# Patient Record
Sex: Female | Born: 1985 | Race: Black or African American | Hispanic: No | Marital: Single | Smoking: Never smoker
Health system: Southern US, Community
[De-identification: ages and names within clinical notes are randomized; demographics above are authoritative.]

## PROBLEM LIST (undated history)

## (undated) ENCOUNTER — Inpatient Hospital Stay (HOSPITAL_COMMUNITY): Payer: Self-pay

## (undated) DIAGNOSIS — R51 Headache: Secondary | ICD-10-CM

## (undated) DIAGNOSIS — I10 Essential (primary) hypertension: Secondary | ICD-10-CM

## (undated) DIAGNOSIS — M419 Scoliosis, unspecified: Secondary | ICD-10-CM

## (undated) DIAGNOSIS — O26851 Spotting complicating pregnancy, first trimester: Secondary | ICD-10-CM

## (undated) DIAGNOSIS — D649 Anemia, unspecified: Secondary | ICD-10-CM

## (undated) DIAGNOSIS — Z789 Other specified health status: Secondary | ICD-10-CM

## (undated) DIAGNOSIS — IMO0002 Reserved for concepts with insufficient information to code with codable children: Secondary | ICD-10-CM

## (undated) DIAGNOSIS — R519 Headache, unspecified: Secondary | ICD-10-CM

## (undated) DIAGNOSIS — B009 Herpesviral infection, unspecified: Secondary | ICD-10-CM

## (undated) DIAGNOSIS — Z3490 Encounter for supervision of normal pregnancy, unspecified, unspecified trimester: Secondary | ICD-10-CM

## (undated) DIAGNOSIS — M549 Dorsalgia, unspecified: Secondary | ICD-10-CM

## (undated) DIAGNOSIS — J45909 Unspecified asthma, uncomplicated: Secondary | ICD-10-CM

## (undated) HISTORY — PX: ANAL FISSURE REPAIR: SHX2312

## (undated) HISTORY — DX: Encounter for supervision of normal pregnancy, unspecified, unspecified trimester: Z34.90

## (undated) HISTORY — DX: Headache, unspecified: R51.9

## (undated) HISTORY — DX: Spotting complicating pregnancy, first trimester: O26.851

## (undated) HISTORY — DX: Headache: R51

## (undated) HISTORY — DX: Reserved for concepts with insufficient information to code with codable children: IMO0002

## (undated) HISTORY — PX: NO PAST SURGERIES: SHX2092

---
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
## 2004-06-07 ENCOUNTER — Ambulatory Visit (HOSPITAL_COMMUNITY): Admission: RE | Admit: 2004-06-07 | Discharge: 2004-06-07 | Payer: Self-pay | Admitting: General Surgery

## 2004-08-15 ENCOUNTER — Ambulatory Visit (HOSPITAL_COMMUNITY): Admission: RE | Admit: 2004-08-15 | Discharge: 2004-08-15 | Payer: Self-pay | Admitting: Family Medicine

## 2007-12-11 ENCOUNTER — Emergency Department (HOSPITAL_COMMUNITY): Admission: EM | Admit: 2007-12-11 | Discharge: 2007-12-11 | Payer: Self-pay | Admitting: Emergency Medicine

## 2008-06-25 ENCOUNTER — Emergency Department (HOSPITAL_COMMUNITY): Admission: EM | Admit: 2008-06-25 | Discharge: 2008-06-25 | Payer: Self-pay | Admitting: Emergency Medicine

## 2008-11-06 ENCOUNTER — Ambulatory Visit (HOSPITAL_COMMUNITY): Admission: RE | Admit: 2008-11-06 | Discharge: 2008-11-06 | Payer: Self-pay | Admitting: Family Medicine

## 2008-11-22 ENCOUNTER — Emergency Department (HOSPITAL_COMMUNITY): Admission: EM | Admit: 2008-11-22 | Discharge: 2008-11-22 | Payer: Self-pay | Admitting: Emergency Medicine

## 2008-11-23 ENCOUNTER — Encounter: Payer: Self-pay | Admitting: Orthopedic Surgery

## 2008-11-23 ENCOUNTER — Emergency Department (HOSPITAL_COMMUNITY): Admission: EM | Admit: 2008-11-23 | Discharge: 2008-11-24 | Payer: Self-pay | Admitting: Emergency Medicine

## 2008-11-26 ENCOUNTER — Ambulatory Visit: Payer: Self-pay | Admitting: Orthopedic Surgery

## 2008-11-26 DIAGNOSIS — M549 Dorsalgia, unspecified: Secondary | ICD-10-CM | POA: Insufficient documentation

## 2008-11-26 DIAGNOSIS — M23302 Other meniscus derangements, unspecified lateral meniscus, unspecified knee: Secondary | ICD-10-CM | POA: Insufficient documentation

## 2008-11-26 DIAGNOSIS — IMO0002 Reserved for concepts with insufficient information to code with codable children: Secondary | ICD-10-CM | POA: Insufficient documentation

## 2008-11-26 DIAGNOSIS — M1710 Unilateral primary osteoarthritis, unspecified knee: Secondary | ICD-10-CM

## 2008-11-26 DIAGNOSIS — M2240 Chondromalacia patellae, unspecified knee: Secondary | ICD-10-CM

## 2008-11-27 ENCOUNTER — Encounter: Payer: Self-pay | Admitting: Orthopedic Surgery

## 2008-11-27 ENCOUNTER — Encounter (INDEPENDENT_AMBULATORY_CARE_PROVIDER_SITE_OTHER): Payer: Self-pay | Admitting: *Deleted

## 2008-11-30 ENCOUNTER — Ambulatory Visit (HOSPITAL_COMMUNITY): Admission: RE | Admit: 2008-11-30 | Discharge: 2008-11-30 | Payer: Self-pay | Admitting: Orthopedic Surgery

## 2008-12-01 ENCOUNTER — Encounter: Payer: Self-pay | Admitting: Orthopedic Surgery

## 2008-12-08 ENCOUNTER — Encounter: Payer: Self-pay | Admitting: Orthopedic Surgery

## 2008-12-08 ENCOUNTER — Encounter (HOSPITAL_COMMUNITY): Admission: RE | Admit: 2008-12-08 | Discharge: 2009-01-07 | Payer: Self-pay | Admitting: Orthopedic Surgery

## 2008-12-10 ENCOUNTER — Ambulatory Visit: Payer: Self-pay | Admitting: Orthopedic Surgery

## 2008-12-29 ENCOUNTER — Telehealth: Payer: Self-pay | Admitting: Orthopedic Surgery

## 2008-12-30 ENCOUNTER — Encounter: Payer: Self-pay | Admitting: Orthopedic Surgery

## 2008-12-31 ENCOUNTER — Telehealth: Payer: Self-pay | Admitting: Orthopedic Surgery

## 2009-01-05 ENCOUNTER — Ambulatory Visit: Payer: Self-pay | Admitting: Orthopedic Surgery

## 2009-01-05 DIAGNOSIS — M5126 Other intervertebral disc displacement, lumbar region: Secondary | ICD-10-CM | POA: Insufficient documentation

## 2009-01-06 ENCOUNTER — Telehealth: Payer: Self-pay | Admitting: Orthopedic Surgery

## 2009-01-08 ENCOUNTER — Ambulatory Visit (HOSPITAL_COMMUNITY): Admission: RE | Admit: 2009-01-08 | Discharge: 2009-01-08 | Payer: Self-pay | Admitting: Orthopedic Surgery

## 2009-01-11 ENCOUNTER — Encounter: Payer: Self-pay | Admitting: Orthopedic Surgery

## 2009-01-11 ENCOUNTER — Telehealth: Payer: Self-pay | Admitting: Orthopedic Surgery

## 2009-01-12 ENCOUNTER — Telehealth: Payer: Self-pay | Admitting: Orthopedic Surgery

## 2009-01-13 ENCOUNTER — Telehealth: Payer: Self-pay | Admitting: Orthopedic Surgery

## 2009-03-07 ENCOUNTER — Emergency Department (HOSPITAL_COMMUNITY): Admission: EM | Admit: 2009-03-07 | Discharge: 2009-03-07 | Payer: Self-pay | Admitting: Emergency Medicine

## 2009-03-22 ENCOUNTER — Encounter: Payer: Self-pay | Admitting: Orthopedic Surgery

## 2009-07-23 ENCOUNTER — Emergency Department (HOSPITAL_COMMUNITY): Admission: EM | Admit: 2009-07-23 | Discharge: 2009-07-23 | Payer: Self-pay | Admitting: Emergency Medicine

## 2009-09-21 ENCOUNTER — Encounter: Payer: Self-pay | Admitting: Orthopedic Surgery

## 2009-10-20 ENCOUNTER — Telehealth (INDEPENDENT_AMBULATORY_CARE_PROVIDER_SITE_OTHER): Payer: Self-pay | Admitting: *Deleted

## 2009-11-10 ENCOUNTER — Emergency Department (HOSPITAL_COMMUNITY): Admission: EM | Admit: 2009-11-10 | Discharge: 2009-11-10 | Payer: Self-pay | Admitting: Emergency Medicine

## 2010-03-06 NOTE — L&D Delivery Note (Signed)
Delivery Note At 7:01 PM a viable female was delivered via Vaginal, Spontaneous Delivery (Presentation: Left Occiput Anterior).  APGAR: 9, 9; weight 8 lb 5.2 oz (3776 g).   Placenta status: Intact, Spontaneous.  Cord: 3 vessels with the following complications: None.  Cord pH: not done   Anesthesia: Epidural  Episiotomy: None Lacerations: None Suture Repair: none Est. Blood Loss (mL):   Mom to postpartum.  Baby to nursery-stable.  CRESENZO-DISHMAN,Amadeus Oyama 12/14/2010, 7:23 PM

## 2010-04-05 NOTE — Progress Notes (Signed)
Summary: call from patient,request appt for back  Phone Note Call from Patient   Caller: Patient Summary of Call: Patient called to request appt for fol/up for her back, said hurting again in same spot.  I advised about having been ref'd to [facility], and that Dr Franky Macho had made recommendations for further ESI.  She states this really didn't work well for her.  Also, patient states she missed last appt which was for her knee.  No appt scheduled.  Please advise if any other recommendations.  Christina Randall new Ph [phone] Initial call taken by: Cammie Sickle,  October 20, 2009 4:41 PM  Follow-up for Phone Call        No need appt here, need to see Dr. Franky Macho her knee pain is probably coming from her back Follow-up by: Ether Griffins,  October 20, 2009 4:49 PM  Additional Follow-up for Phone Call Additional follow up Details #1::        cALLED patient back at [phone], ph#non-working Retried [facility] [phone] - Left voice mail msg. Additional Follow-up by: Cammie Sickle,  October 21, 2009 9:56 AM

## 2010-04-05 NOTE — Consult Note (Signed)
Summary: Consult [facility] Dr Franky Macho  Consult [facility] Dr Franky Macho   Imported By: Cammie Sickle 04/20/2009 16:12:16  _____________________________________________________________________  External Attachment:    Type:   Image     Comment:   External Document

## 2010-04-05 NOTE — Letter (Signed)
Summary: *Orthopedic No Show Letter  [facility]  [address]  [place], [place] [address]   Phone: [phone]  Fax: [phone]      09/20/2009    Christina Randall [address] [place], [place]  [address]    Dear Christina Randall,   Our records indicate that you missed your scheduled appointment with Dr. Beaulah Corin on 09/16/2009.  Please contact this office to reschedule your appointment as soon as possible.  It is important that you keep your scheduled appointments with your physician, so we can provide you the best care possible.        Sincerely,    Dr. Terrance Mass, MD [facility] Phone [phone]

## 2010-05-22 LAB — BASIC METABOLIC PANEL
BUN: 9 mg/dL (ref 6–23)
GFR calc Af Amer: >60 mL/min (ref >60)
GFR calc non Af Amer: >60 mL/min (ref >60)
Potassium: 3.7 mEq/L (ref 3.5–5.1)
Sodium: 137 mEq/L (ref 135–145)

## 2010-05-22 LAB — CBC
HCT: 37.1 % (ref 36.0–46.0)
Hemoglobin: 12.3 g/dL (ref 12.0–15.0)
Platelets: 317 10*3/uL (ref 150–400)
RBC: 4.55 MIL/uL (ref 3.87–5.11)
WBC: 6.1 10*3/uL (ref 4.0–10.5)

## 2010-05-22 LAB — DIFFERENTIAL
Eosinophils Relative: 1 % (ref 0–5)
Lymphocytes Relative: 25 % (ref 12–46)
Lymphs Abs: 1.5 10*3/uL (ref 0.7–4.0)
Monocytes Relative: 8 % (ref 3–12)

## 2010-05-22 LAB — URINALYSIS, ROUTINE W REFLEX MICROSCOPIC
Bilirubin Urine: NEGATIVE
Leukocytes, UA: NEGATIVE
Nitrite: NEGATIVE
Specific Gravity, Urine: 1.01 (ref 1.005–1.030)
pH: 7 (ref 5.0–8.0)

## 2010-05-22 LAB — PREGNANCY, URINE: Preg Test, Ur: NEGATIVE

## 2010-05-22 LAB — URINE MICROSCOPIC-ADD ON

## 2010-05-23 LAB — DIFFERENTIAL
Eosinophils Relative: 3 % (ref 0–5)
Lymphocytes Relative: 36 % (ref 12–46)
Lymphs Abs: 2.2 10*3/uL (ref 0.7–4.0)
Monocytes Absolute: 0.5 10*3/uL (ref 0.1–1.0)

## 2010-05-23 LAB — COMPREHENSIVE METABOLIC PANEL
AST: 18 U/L (ref 0–37)
Alkaline Phosphatase: 64 U/L (ref 39–117)
BUN: 10 mg/dL (ref 6–23)
CO2: 28 mEq/L (ref 19–32)
Chloride: 105 mEq/L (ref 96–112)
Creatinine, Ser: 0.83 mg/dL (ref 0.4–1.2)
GFR calc Af Amer: >60 mL/min (ref >60)
GFR calc non Af Amer: >60 mL/min (ref >60)
Total Bilirubin: 0.2 mg/dL — ABNORMAL LOW (ref 0.3–1.2)

## 2010-05-23 LAB — URINALYSIS, ROUTINE W REFLEX MICROSCOPIC
Glucose, UA: NEGATIVE mg/dL
Hgb urine dipstick: NEGATIVE
Ketones, ur: NEGATIVE mg/dL
Protein, ur: NEGATIVE mg/dL

## 2010-05-23 LAB — CBC
HCT: 33.8 % — ABNORMAL LOW (ref 36.0–46.0)
Hemoglobin: 11.8 g/dL — ABNORMAL LOW (ref 12.0–15.0)
WBC: 6.1 10*3/uL (ref 4.0–10.5)

## 2010-05-23 LAB — WET PREP, GENITAL

## 2010-05-24 LAB — ABO/RH: RH Type: POSITIVE

## 2010-05-24 LAB — RUBELLA ANTIBODY, IGM: Rubella: IMMUNE

## 2010-05-24 LAB — RPR: RPR: NONREACTIVE

## 2010-05-24 LAB — HEPATITIS B SURFACE ANTIGEN: Hepatitis B Surface Ag: NEGATIVE

## 2010-06-10 LAB — URINE MICROSCOPIC-ADD ON

## 2010-06-10 LAB — URINALYSIS, ROUTINE W REFLEX MICROSCOPIC
Bilirubin Urine: NEGATIVE
Ketones, ur: NEGATIVE mg/dL
Nitrite: NEGATIVE
pH: 6.5 (ref 5.0–8.0)

## 2010-06-10 LAB — PREGNANCY, URINE: Preg Test, Ur: NEGATIVE

## 2010-06-14 ENCOUNTER — Other Ambulatory Visit (HOSPITAL_COMMUNITY)
Admission: RE | Admit: 2010-06-14 | Discharge: 2010-06-14 | Disposition: A | Discharge status: Home / Self Care | Payer: Medicaid Other | Source: Ambulatory Visit | Attending: Obstetrics & Gynecology | Admitting: Obstetrics & Gynecology

## 2010-06-14 DIAGNOSIS — Z01419 Encounter for gynecological examination (general) (routine) without abnormal findings: Secondary | ICD-10-CM | POA: Insufficient documentation

## 2010-06-14 DIAGNOSIS — Z113 Encounter for screening for infections with a predominantly sexual mode of transmission: Secondary | ICD-10-CM | POA: Insufficient documentation

## 2010-07-09 ENCOUNTER — Emergency Department (HOSPITAL_COMMUNITY)
Admission: EM | Admit: 2010-07-09 | Discharge: 2010-07-09 | Discharge status: Against Medical Advice | Payer: Self-pay | Attending: Emergency Medicine | Admitting: Emergency Medicine

## 2010-07-22 NOTE — H&P (Signed)
Christina Randall, Christina Randall             ACCOUNT NO.:  [id number]   MEDICAL RECORD NO.:  [id number]          PATIENT TYPE:  AMB   LOCATION:  DAY                           FACILITY:  [facility]   PHYSICIAN:  Jerolyn Shin C. Katrinka Blazing, M.D.   DATE OF BIRTH:  1985/09/08   DATE OF ADMISSION:  DATE OF DISCHARGE:  LH                                HISTORY & PHYSICAL   HISTORY OF PRESENT ILLNESS:  A 25 year old female with a history of anal  pain for greater than 10 months. She had onset of anal pain after delivery  of her child. She states that she was constipated up to 3 weeks in the post-  partum period. She had a tear during delivery but it did not extend through  the rectum. The patient has pain with every bowel movement. She has been  treated with ProctoFoam, Lidocaine gel, and stool softeners without  improvement. She has a chronic anal fissure and is scheduled to have  fissurectomy with internal sphincterotomy.   PAST MEDICAL HISTORY:  There is a history of asthma and anemia.   MEDICATIONS:  Oral contraceptives.   ALLERGIES:  None.   FAMILY HISTORY:  Positive for diabetes.   SOCIAL HISTORY:  She is married, employed. She does not drink, smoke, or use  drugs.   PHYSICAL EXAMINATION:  VITAL SIGNS:  Blood pressure 116/80, pulse 68,  respiratory rate 20. Weight 240 pounds.  HEENT:  Unremarkable.  NECK:  Supple. No jugular venous distention or bruit.  CHEST:  Clear to auscultation.  HEART:  Regular rate and rhythm. Without murmur, rub, or gallop.  ABDOMEN:  Soft, nontender. No masses.  RECTAL:  Reveals a large posterior anal fissure with a tight anal sphincter  and extreme tenderness.  EXTREMITIES:  No clubbing, cyanosis, or edema.  NEUROLOGIC:  No focal, motor, sensory or cerebellar deficits.   IMPRESSION:  1.  Chronic anal fissure.  2.  Anal stenosis.   PLAN:  Examination under anesthesia. Anal fissurectomy with sphincterotomy.      LCS/MEDQ  D:  06/06/2004  T:  06/06/2004  Job:  [id number]    cc:   Lazaro Arms, M.D.  [address], [address]  [place]  [place] [address]  Fax: [phone]

## 2010-07-22 NOTE — Op Note (Signed)
NAMEJANELLY, Christina Randall             ACCOUNT NO.:  [id number]   MEDICAL RECORD NO.:  [id number]          PATIENT TYPE:  AMB   LOCATION:  DAY                           FACILITY:  [facility]   PHYSICIAN:  Jerolyn Shin C. Katrinka Blazing, M.D.   DATE OF BIRTH:  11-05-1985   DATE OF PROCEDURE:  06/07/2004  DATE OF DISCHARGE:                                 OPERATIVE REPORT   PREOPERATIVE DIAGNOSIS:  Anal fissure with anal stenosis.   POSTOPERATIVE DIAGNOSIS:  Anterior and posterior anal fissure with anal  stenosis.   PROCEDURE:  Anterior and posterior anal fissurectomy with internal anal  sphincterotomy.   SURGEON:  Elpidio Anis, M.D.   DESCRIPTION:  The procedure was done under spinal anesthesia with the  patient in a jackknife position.  She was prepped and draped in a sterile  field.  Digital examination of the anus was done.  An operating anoscope was  introduced, and this revealed a large, wide anterior fissure and posterior  fissure was located at 12 o'clock and 6 o'clock positions, respectively.  The anterior fissure was incised taking care to remove all of the  inflammatory scar tissue extending down to the internal sphincter.  The  posterior fissure was treated in a like manner. The circular internal fibers  were then dissected and two-thirds of the fibers were incised through the  bed of the posterior fissure.  Hemostasis was achieved.  The mucosal defect was then closed over the operative area using running  locking 2-0 chromic.  The anterior fissure was closed with running locking 2-  0 chromic.  Digital dilatation of the anal canal was carried out to three  fingerbreadths.  The patient tolerated procedure well.  Local infiltration  was carried out with 1% Xylocaine with epinephrine.  The patient was  transferred to a bed and taken to the postanesthetic care unit for further  monitoring.      LCS/MEDQ  D:  06/07/2004  T:  06/07/2004  Job:  [id number]   cc:   Lazaro Arms, M.D.  [address], [address]  [place]  [place] [address]  Fax: [phone]

## 2010-10-05 ENCOUNTER — Telehealth: Payer: Self-pay | Admitting: Orthopedic Surgery

## 2010-10-05 NOTE — Telephone Encounter (Signed)
Patient called 10/04/10 and 10/05/10 (today) to request appointment for right knee pain.  Patient had been seen for this problem in 2010 and had been referred out to [facility].  She was seen by Dr. Franky Macho in 2011.  Also had been referred to have epidural steroid injections.    She states that she is now pregnant and having knee problem again.  She states her OB/Gyn is Dr. Emelda Fear, and is aware that she cannot have Xrays at this time. She said Dr. Emelda Fear "wants to know about her knee problem."  No referral or request for medical records has been received from this office.  Patient's insurance is [employer] which requires a referral from primary care physician.   I have advised patient of all of the above and that we therefore cannot schedule appointment directly. Please advise if any further recommendations.

## 2010-10-06 NOTE — Telephone Encounter (Signed)
Needs appt for her knee, ok, she has a meniscal tear, next available after she has referral, not emergent this was why she was seen before

## 2010-10-07 NOTE — Telephone Encounter (Signed)
I called back to patient. Left voice mail message for her to return call about appointment. [phone] Home ph#)

## 2010-10-14 NOTE — Telephone Encounter (Signed)
10/14/10 called back to patient, left same voice mail message.

## 2010-10-24 DIAGNOSIS — N930 Postcoital and contact bleeding: Secondary | ICD-10-CM

## 2010-11-27 ENCOUNTER — Encounter (HOSPITAL_COMMUNITY): Payer: Self-pay

## 2010-11-27 ENCOUNTER — Inpatient Hospital Stay (HOSPITAL_COMMUNITY)
Admission: AD | Admit: 2010-11-27 | Discharge: 2010-11-27 | Disposition: A | Discharge status: Home / Self Care | Payer: Medicaid Other | Source: Ambulatory Visit | Attending: Obstetrics & Gynecology | Admitting: Obstetrics & Gynecology

## 2010-11-27 DIAGNOSIS — O4690 Antepartum hemorrhage, unspecified, unspecified trimester: Secondary | ICD-10-CM | POA: Insufficient documentation

## 2010-11-27 DIAGNOSIS — N930 Postcoital and contact bleeding: Secondary | ICD-10-CM

## 2010-11-27 HISTORY — DX: Herpesviral infection, unspecified: B00.9

## 2010-11-27 LAB — URINALYSIS, ROUTINE W REFLEX MICROSCOPIC
Glucose, UA: NEGATIVE mg/dL
Ketones, ur: NEGATIVE mg/dL
Leukocytes, UA: NEGATIVE
Nitrite: NEGATIVE
Specific Gravity, Urine: 1.015 (ref 1.005–1.030)
pH: 6.5 (ref 5.0–8.0)

## 2010-11-27 LAB — URINE MICROSCOPIC-ADD ON

## 2010-11-27 NOTE — Progress Notes (Signed)
D LAWSON CNM 37.5WKS G2P1 C/O VAGINAL BLEEDING THIS AM. SVE FT/THICK/HIGH. REACTIVE STRIP, OCC UC'S. NO ORDERS AT THIS TIME WILL SEE IN MAU.

## 2010-11-27 NOTE — ED Provider Notes (Signed)
History   Sex day before yesterday got up this am with dark bleeding sm amt. No pain, no ROM.  Chief Complaint  Patient presents with  . Vaginal Bleeding  . Abdominal Cramping   HPI  OB History    Grav Para Term Preterm Abortions TAB SAB Ect Mult Living   2 1 1       1       Past Medical History  Diagnosis Date  . Herpes     Past Surgical History  Procedure Date  . No past surgeries     No family history on file.  History  Substance Use Topics  . Smoking status: Never Smoker   . Smokeless tobacco: Not on file  . Alcohol Use: No    Allergies: No Known Allergies  Prescriptions prior to admission  Medication Sig Dispense Refill  . acetaminophen (TYLENOL) 325 MG tablet Take 325 mg by mouth every 6 (six) hours as needed. For back pain or headache        . Calcium Carbonate Antacid (TUMS PO) Take 1 tablet by mouth 3 (three) times daily as needed. For heartburn       . ValACYclovir HCl (VALTREX PO) Take 1 tablet by mouth 2 (two) times daily.          Review of Systems  Constitutional: Negative.   HENT: Negative.   Eyes: Negative.   Respiratory: Negative.   Cardiovascular: Negative.   Gastrointestinal: Negative.   Genitourinary: Negative.   Musculoskeletal: Negative.   Skin: Negative.   Neurological: Negative.   Endo/Heme/Allergies: Negative.   Psychiatric/Behavioral: Negative.    Physical Exam   Blood pressure 143/84, pulse 93, temperature 98.7 F (37.1 C), resp. rate 16, height 5\' 9"  (1.753 m), weight 132.269 kg (291 lb 9.6 oz).  Physical Exam  Constitutional: She is oriented to person, place, and time. She appears well-developed and well-nourished.  HENT:  Head: Normocephalic.  Eyes: Pupils are equal, round, and reactive to light.  Neck: Normal range of motion.  Cardiovascular: Normal rate and regular rhythm.   Respiratory: Effort normal and breath sounds normal.  GI: Soft. Bowel sounds are normal.  Genitourinary: Vagina normal and uterus normal.      SVE ft/th/post/high. Scant amt dark brownish spotting noted on glove with exam.  Musculoskeletal: Normal range of motion.  Neurological: She is alert and oriented to person, place, and time. She has normal reflexes.  Skin: Skin is warm and dry.  Psychiatric: She has a normal mood and affect. Her behavior is normal. Judgment and thought content normal.    MAU Course  Procedures  MDM POC discussed with Dr. Despina Hidden. Pt to be d'cd home.  Assessment and Plan  Stable maternal-fetal unit. Postcoital bleeding.  Zerita Boers 11/27/2010, 6:03 PM

## 2010-11-27 NOTE — Progress Notes (Signed)
This morning had spotting then got out of church had bleeding like a period with mild cramping. Last intercourse 2 days ago.

## 2010-12-05 LAB — CBC
HCT: 36
Hemoglobin: 11.9 — ABNORMAL LOW
MCHC: 33
RBC: 4.56

## 2010-12-05 LAB — DIFFERENTIAL
Eosinophils Relative: 1
Lymphocytes Relative: 8 — ABNORMAL LOW
Monocytes Absolute: 0.5
Monocytes Relative: 5
Neutro Abs: 9 — ABNORMAL HIGH

## 2010-12-05 LAB — BASIC METABOLIC PANEL
CO2: 27
Calcium: 8.7
GFR calc Af Amer: >60
GFR calc non Af Amer: >60
Glucose, Bld: 99
Potassium: 3.6
Sodium: 137

## 2010-12-05 LAB — STREP A DNA PROBE: Group A Strep Probe: NEGATIVE

## 2010-12-13 ENCOUNTER — Inpatient Hospital Stay (HOSPITAL_COMMUNITY)
Admission: AD | Admit: 2010-12-13 | Discharge: 2010-12-13 | Disposition: A | Discharge status: Home / Self Care | Payer: Medicaid Other | Source: Ambulatory Visit | Attending: Family Medicine | Admitting: Family Medicine

## 2010-12-13 ENCOUNTER — Encounter (HOSPITAL_COMMUNITY): Payer: Self-pay | Admitting: *Deleted

## 2010-12-13 DIAGNOSIS — O479 False labor, unspecified: Secondary | ICD-10-CM | POA: Insufficient documentation

## 2010-12-13 MED ORDER — METHOTREXATE INJECTION FOR WOMEN'S HOSPITAL: 50.0000 mg/m2 | Freq: Once | INTRAMUSCULAR | Status: DC

## 2010-12-13 NOTE — ED Provider Notes (Signed)
History     Chief Complaint  Patient presents with  . Labor Eval   HPI G2 P1001 at 40.0 weeks presents for labor eval. States has contractions that make her take a deep breath, but not too painful. Denies leaking or bleeding. + fetal movement.    Past Medical History  Diagnosis Date  . Herpes     Past Surgical History  Procedure Date  . No past surgeries     No family history on file.  History  Substance Use Topics  . Smoking status: Never Smoker   . Smokeless tobacco: Not on file  . Alcohol Use: No    Allergies: No Known Allergies  Prescriptions prior to admission  Medication Sig Dispense Refill  . ValACYclovir HCl (VALTREX PO) Take 1 tablet by mouth 2 (two) times daily.        Marland Kitchen acetaminophen (TYLENOL) 325 MG tablet Take 325 mg by mouth every 6 (six) hours as needed. For back pain or headache        . Calcium Carbonate Antacid (TUMS PO) Take 1 tablet by mouth 3 (three) times daily as needed. For heartburn         ROS  As above.  Physical Exam   Blood pressure 132/80, pulse 87, temperature 98.8 F (37.1 C), temperature source Oral, resp. rate 20, height 5\' 9"  (1.753 m), weight 293 lb 3.2 oz (132.995 kg).  Physical Exam  Constitutional: She is oriented to person, place, and time. She appears well-developed and well-nourished.  HENT:  Head: Normocephalic.  Respiratory: Effort normal.  GI: Soft. There is no tenderness. There is no rebound and no guarding.  Genitourinary: Vagina normal and uterus normal. No vaginal discharge found.       FHR reactive with some early decels when lying supine. Irregular contractions, patient does not appear to be breathing through them when they occur. Has appt Thursday. Cervix 2cm/70%/-3/vtx and this is unchanged after 1.5 hours. FHR viewed by Dr Shawnie Pons also  Musculoskeletal: Normal range of motion.  Neurological: She is alert and oriented to person, place, and time.  Skin: Skin is warm and dry.  Psychiatric: She has a normal  mood and affect.    MAU Course  Procedures   Assessment and Plan  A:  Prodromal vs Latent Contractions, no cervical change P:  Discharge home (OK w/Dr Shawnie Pons) Keep appt Thursday Labor precautions.  Christina Randall 12/13/2010, 3:26 PM

## 2010-12-13 NOTE — Progress Notes (Signed)
Ctx's started at 0400, getting closer and stronger. No bleeding or leaking, lost mucous.

## 2010-12-13 NOTE — ED Provider Notes (Signed)
Chart reviewed and agree with management and plan.  

## 2010-12-14 ENCOUNTER — Encounter (HOSPITAL_COMMUNITY): Payer: Self-pay | Admitting: *Deleted

## 2010-12-14 ENCOUNTER — Encounter (HOSPITAL_COMMUNITY): Payer: Self-pay | Admitting: Anesthesiology

## 2010-12-14 ENCOUNTER — Inpatient Hospital Stay (HOSPITAL_COMMUNITY)
Admission: AD | Admit: 2010-12-14 | Discharge: 2010-12-16 | DRG: 775 | Disposition: A | Discharge status: Home / Self Care | Payer: Medicaid Other | Source: Ambulatory Visit | Attending: Obstetrics & Gynecology | Admitting: Obstetrics & Gynecology

## 2010-12-14 ENCOUNTER — Inpatient Hospital Stay (HOSPITAL_COMMUNITY): Payer: Medicaid Other | Admitting: Anesthesiology

## 2010-12-14 DIAGNOSIS — O4290 Premature rupture of membranes, unspecified as to length of time between rupture and onset of labor, unspecified weeks of gestation: Principal | ICD-10-CM | POA: Diagnosis present

## 2010-12-14 DIAGNOSIS — IMO0001 Reserved for inherently not codable concepts without codable children: Secondary | ICD-10-CM

## 2010-12-14 HISTORY — DX: Other specified health status: Z78.9

## 2010-12-14 LAB — CBC
HCT: 32.3 % — ABNORMAL LOW (ref 36.0–46.0)
MCHC: 32.8 g/dL (ref 30.0–36.0)
RDW: 14.9 % (ref 11.5–15.5)
WBC: 8.9 10*3/uL (ref 4.0–10.5)

## 2010-12-14 LAB — RPR: RPR Ser Ql: NONREACTIVE

## 2010-12-14 MED ORDER — PENICILLIN G POTASSIUM 5000000 UNITS IJ SOLR
5.0000 10*6.[IU] | Freq: Once | INTRAVENOUS | Status: AC
  Administered 2010-12-14: 5 10*6.[IU] via INTRAVENOUS
  Filled 2010-12-14: qty 5

## 2010-12-14 MED ORDER — PHENYLEPHRINE 40 MCG/ML (10ML) SYRINGE FOR IV PUSH (FOR BLOOD PRESSURE SUPPORT)
80.0000 ug | PREFILLED_SYRINGE | INTRAVENOUS | Status: DC | PRN
  Filled 2010-12-14: qty 5

## 2010-12-14 MED ORDER — LACTATED RINGERS IV SOLN: 500.0000 mL | Freq: Once | INTRAVENOUS | Status: DC

## 2010-12-14 MED ORDER — TERBUTALINE SULFATE 1 MG/ML IJ SOLN: 0.2500 mg | Freq: Once | INTRAMUSCULAR | Status: DC | PRN

## 2010-12-14 MED ORDER — OXYTOCIN BOLUS FROM INFUSION
500.0000 mL | Freq: Once | INTRAVENOUS | Status: DC
  Filled 2010-12-14: qty 500

## 2010-12-14 MED ORDER — ONDANSETRON HCL 4 MG/2ML IJ SOLN: 4.0000 mg | Freq: Four times a day (QID) | INTRAMUSCULAR | Status: DC | PRN

## 2010-12-14 MED ORDER — PRENATAL PLUS 27-1 MG PO TABS
1.0000 | ORAL_TABLET | Freq: Every day | ORAL | Status: DC
  Administered 2010-12-15 – 2010-12-16 (×2): 1 via ORAL
  Filled 2010-12-14 (×2): qty 1

## 2010-12-14 MED ORDER — ONDANSETRON HCL 4 MG PO TABS
4.0000 mg | ORAL_TABLET | Freq: Once | ORAL | Status: AC
  Administered 2010-12-14: 4 mg via ORAL
  Filled 2010-12-14: qty 1

## 2010-12-14 MED ORDER — OXYTOCIN 20 UNITS IN LACTATED RINGERS INFUSION - SIMPLE: 125.0000 mL/h | Freq: Once | INTRAVENOUS | Status: DC

## 2010-12-14 MED ORDER — BENZOCAINE-MENTHOL 20-0.5 % EX AERO
1.0000 "application " | INHALATION_SPRAY | CUTANEOUS | Status: DC | PRN
  Administered 2010-12-14: 1 via TOPICAL

## 2010-12-14 MED ORDER — SIMETHICONE 80 MG PO CHEW: 80.0000 mg | CHEWABLE_TABLET | ORAL | Status: DC | PRN

## 2010-12-14 MED ORDER — FLEET ENEMA 7-19 GM/118ML RE ENEM: 1.0000 | ENEMA | RECTAL | Status: DC | PRN

## 2010-12-14 MED ORDER — FENTANYL 2.5 MCG/ML BUPIVACAINE 1/10 % EPIDURAL INFUSION (WH - ANES)
INTRAMUSCULAR | Status: DC | PRN
  Administered 2010-12-14: 14 mL/h via EPIDURAL

## 2010-12-14 MED ORDER — ACETAMINOPHEN 325 MG PO TABS: 650.0000 mg | ORAL_TABLET | ORAL | Status: DC | PRN

## 2010-12-14 MED ORDER — WITCH HAZEL-GLYCERIN EX PADS
1.0000 | MEDICATED_PAD | CUTANEOUS | Status: DC | PRN
Start: 2010-12-14 — End: 2010-12-16

## 2010-12-14 MED ORDER — MEASLES, MUMPS & RUBELLA VAC ~~LOC~~ INJ: 0.5000 mL | INJECTION | Freq: Once | SUBCUTANEOUS | Status: DC

## 2010-12-14 MED ORDER — OXYCODONE-ACETAMINOPHEN 5-325 MG PO TABS
1.0000 | ORAL_TABLET | ORAL | Status: DC | PRN
  Administered 2010-12-15 – 2010-12-16 (×5): 1 via ORAL
  Administered 2010-12-16: 2 via ORAL
  Filled 2010-12-14 (×5): qty 1
  Filled 2010-12-14: qty 2

## 2010-12-14 MED ORDER — FENTANYL 2.5 MCG/ML BUPIVACAINE 1/10 % EPIDURAL INFUSION (WH - ANES)
14.0000 mL/h | INTRAMUSCULAR | Status: DC
  Administered 2010-12-14 (×3): 14 mL/h via EPIDURAL
  Filled 2010-12-14 (×4): qty 60

## 2010-12-14 MED ORDER — ONDANSETRON HCL 4 MG/2ML IJ SOLN: 4.0000 mg | INTRAMUSCULAR | Status: DC | PRN

## 2010-12-14 MED ORDER — IBUPROFEN 600 MG PO TABS
600.0000 mg | ORAL_TABLET | Freq: Four times a day (QID) | ORAL | Status: DC
  Administered 2010-12-14 – 2010-12-16 (×6): 600 mg via ORAL
  Filled 2010-12-14 (×6): qty 1

## 2010-12-14 MED ORDER — LANOLIN HYDROUS EX OINT: TOPICAL_OINTMENT | CUTANEOUS | Status: DC | PRN

## 2010-12-14 MED ORDER — OXYCODONE-ACETAMINOPHEN 5-325 MG PO TABS: 2.0000 | ORAL_TABLET | ORAL | Status: DC | PRN

## 2010-12-14 MED ORDER — EPHEDRINE 5 MG/ML INJ
10.0000 mg | INTRAVENOUS | Status: DC | PRN
  Filled 2010-12-14 (×2): qty 4

## 2010-12-14 MED ORDER — PHENYLEPHRINE 40 MCG/ML (10ML) SYRINGE FOR IV PUSH (FOR BLOOD PRESSURE SUPPORT)
80.0000 ug | PREFILLED_SYRINGE | INTRAVENOUS | Status: DC | PRN
  Filled 2010-12-14 (×2): qty 5

## 2010-12-14 MED ORDER — SENNOSIDES-DOCUSATE SODIUM 8.6-50 MG PO TABS
2.0000 | ORAL_TABLET | Freq: Every day | ORAL | Status: DC
  Administered 2010-12-14 – 2010-12-15 (×2): 2 via ORAL

## 2010-12-14 MED ORDER — OXYTOCIN 20 UNITS IN LACTATED RINGERS INFUSION - SIMPLE
1.0000 m[IU]/min | INTRAVENOUS | Status: DC
  Administered 2010-12-14: 2 m[IU]/min via INTRAVENOUS
  Filled 2010-12-14: qty 1000

## 2010-12-14 MED ORDER — BENZOCAINE-MENTHOL 20-0.5 % EX AERO
INHALATION_SPRAY | CUTANEOUS | Status: AC
  Administered 2010-12-14: 1 via TOPICAL
  Filled 2010-12-14: qty 56

## 2010-12-14 MED ORDER — LIDOCAINE HCL (PF) 1 % IJ SOLN
30.0000 mL | INTRAMUSCULAR | Status: DC | PRN
  Filled 2010-12-14 (×2): qty 30

## 2010-12-14 MED ORDER — LACTATED RINGERS IV SOLN: 500.0000 mL | INTRAVENOUS | Status: DC | PRN

## 2010-12-14 MED ORDER — DIPHENHYDRAMINE HCL 25 MG PO CAPS: 25.0000 mg | ORAL_CAPSULE | Freq: Four times a day (QID) | ORAL | Status: DC | PRN

## 2010-12-14 MED ORDER — ONDANSETRON HCL 4 MG PO TABS: 4.0000 mg | ORAL_TABLET | ORAL | Status: DC | PRN

## 2010-12-14 MED ORDER — PENICILLIN G POTASSIUM 5000000 UNITS IJ SOLR
2.5000 10*6.[IU] | INTRAVENOUS | Status: DC
  Administered 2010-12-14 (×3): 2.5 10*6.[IU] via INTRAVENOUS
  Filled 2010-12-14 (×6): qty 2.5

## 2010-12-14 MED ORDER — DIBUCAINE 1 % RE OINT: 1.0000 "application " | TOPICAL_OINTMENT | RECTAL | Status: DC | PRN

## 2010-12-14 MED ORDER — LACTATED RINGERS IV SOLN
INTRAVENOUS | Status: DC
  Administered 2010-12-14 (×3): via INTRAVENOUS

## 2010-12-14 MED ORDER — EPHEDRINE 5 MG/ML INJ
10.0000 mg | INTRAVENOUS | Status: DC | PRN
  Filled 2010-12-14: qty 4

## 2010-12-14 MED ORDER — DIPHENHYDRAMINE HCL 50 MG/ML IJ SOLN: 12.5000 mg | INTRAMUSCULAR | Status: DC | PRN

## 2010-12-14 MED ORDER — NALBUPHINE HCL 10 MG/ML IJ SOLN
10.0000 mg | INTRAMUSCULAR | Status: DC | PRN
  Filled 2010-12-14: qty 1

## 2010-12-14 MED ORDER — LIDOCAINE HCL 1.5 % IJ SOLN
INTRAMUSCULAR | Status: DC | PRN
  Administered 2010-12-14 (×2): 5 mL via EPIDURAL

## 2010-12-14 MED ORDER — ZOLPIDEM TARTRATE 5 MG PO TABS: 5.0000 mg | ORAL_TABLET | Freq: Every evening | ORAL | Status: DC | PRN

## 2010-12-14 MED ORDER — CITRIC ACID-SODIUM CITRATE 334-500 MG/5ML PO SOLN: 30.0000 mL | ORAL | Status: DC | PRN

## 2010-12-14 MED ORDER — ACETAMINOPHEN 325 MG PO TABS
650.0000 mg | ORAL_TABLET | Freq: Four times a day (QID) | ORAL | Status: DC | PRN
  Administered 2010-12-14: 650 mg via ORAL
  Filled 2010-12-14: qty 1

## 2010-12-14 MED ORDER — IBUPROFEN 600 MG PO TABS: 600.0000 mg | ORAL_TABLET | Freq: Four times a day (QID) | ORAL | Status: DC | PRN

## 2010-12-14 NOTE — Progress Notes (Signed)
PT SAYS SHE WAS HERE TODAY- 2 CM-  HOME AND RETURNED  WITH SROM AT 0130.Marland Kitchen  HAS HX - HSV - TAKING VALTREX-  VERY CONFIDENTIAL

## 2010-12-14 NOTE — Progress Notes (Signed)
Dr. Adrian Blackwater at bedside and notified of pt status, FHR, UC pattern, and PCN given.  Orders received, will continue to monitor.

## 2010-12-14 NOTE — Anesthesia Procedure Notes (Signed)
Epidural Patient location during procedure: OB Start time: 12/14/2010 5:30 AM End time: 12/14/2010 5:37 AM Reason for block: procedure for pain  Staffing Anesthesiologist: Sandrea Hughs Performed by: anesthesiologist   Preanesthetic Checklist Completed: patient identified, site marked, surgical consent, pre-op evaluation, timeout performed, IV checked, risks and benefits discussed and monitors and equipment checked  Epidural Patient position: sitting Prep: site prepped and draped and DuraPrep Patient monitoring: continuous pulse ox and blood pressure Approach: midline Injection technique: LOR air  Needle:  Needle type: Tuohy  Needle gauge: 17 G Needle length: 9 cm Needle insertion depth: 7 cm Catheter type: closed end flexible Catheter size: 19 Gauge Catheter at skin depth: 13 cm Test dose: negative and 1.5% lidocaine  Assessment Sensory level: T8 Events: blood not aspirated, injection not painful, no injection resistance, negative IV test and no paresthesia

## 2010-12-14 NOTE — Anesthesia Preprocedure Evaluation (Addendum)
Anesthesia Evaluation  Name, MR# and DOB Patient awake  General Assessment Comment  Reviewed: Allergy & Precautions, H&P , NPO status , Patient's Chart, lab work & pertinent test results  Airway Mallampati: III TM Distance: >3 FB Neck ROM: full    Dental No notable dental hx.    Pulmonary  clear to auscultation  Pulmonary exam normal       Cardiovascular     Neuro/Psych Negative Neurological ROS  Negative Psych ROS   GI/Hepatic negative GI ROS Neg liver ROS    Endo/Other  Morbid obesity  Renal/GU negative Renal ROS  Genitourinary negative   Musculoskeletal negative musculoskeletal ROS (+)   Abdominal (+) obese,   Peds negative pediatric ROS (+)  Hematology negative hematology ROS (+)   Anesthesia Other Findings   Reproductive/Obstetrics (+) Pregnancy                           Anesthesia Physical Anesthesia Plan  ASA: III  Anesthesia Plan: Epidural   Post-op Pain Management:    Induction:   Airway Management Planned:   Additional Equipment:   Intra-op Plan:   Post-operative Plan:   Informed Consent: I have reviewed the patients History and Physical, chart, labs and discussed the procedure including the risks, benefits and alternatives for the proposed anesthesia with the patient or authorized representative who has indicated his/her understanding and acceptance.     Plan Discussed with:   Anesthesia Plan Comments:         Anesthesia Quick Evaluation

## 2010-12-14 NOTE — Progress Notes (Signed)
   Christina Randall is a 25 y.o. G2P1001 at [redacted]w[redacted]d by ultrasound admitted for PROM  Subjective:   Objective: BP 127/72  Pulse 106  Temp(Src) 98 F (36.7 C) (Oral)  Resp 20  Ht 5\' 9"  (1.753 m)  Wt 133.981 kg (295 lb 6 oz)  BMI 43.62 kg/m2    FHT:  FHR: 130 bpm, variability: moderate,  accelerations:  Present,  decelerations:  Absent UC:   regular, every 2-3 minutes SVE:  C/C/+2; no pressure or urge to push  Labs: Lab Results  Component Value Date   WBC 8.9 12/14/2010   HGB 10.6* 12/14/2010   HCT 32.3* 12/14/2010   MCV 77.3* 12/14/2010   PLT 252 12/14/2010    Assessment / Plan: Induction of labor due to PROM,  progressing well on pitocin.  Will labor down until urge to push  Labor: beginning 2nd stage Fetal Wellbeing:  Category I Pain Control:  Epidural Anticipated MOD:  NSVD  CRESENZO-DISHMAN,Juluis Fitzsimmons 12/14/2010, 5:36 PM

## 2010-12-14 NOTE — Progress Notes (Signed)
Christina Randall is a 26 y.o. G2P1001 at [redacted]w[redacted]d   Subjective: Comfortable with epidural now. Completed 1st dose PCN.  Objective: BP 135/93  Pulse 99  Temp(Src) 97.7 F (36.5 C) (Oral)  Resp 18  Ht 5\' 9"  (1.753 m)  Wt 133.981 kg (295 lb 6 oz)  BMI 43.62 kg/m2other BPs between 120-135/70-84      FHT:  FHR: 120 bpm, variability: moderate,  accelerations:  Present,  decelerations:  Absentocc mi variables UC:   regular, every 3-4 minutes SVE:   Dilation: 4.5 Effacement (%): 90 Station: -2 Exam by:: ansah-mensah, rnc  Labs: Lab Results  Component Value Date   WBC 8.9 12/14/2010   HGB 10.6* 12/14/2010   HCT 32.3* 12/14/2010   MCV 77.3* 12/14/2010   PLT 252 12/14/2010    Assessment / Plan: Spontaneous labor, progressing normally  Will reeval cx in 1-2 hrs or sooner if needed.  Cam Hai 12/14/2010, 6:46 AM

## 2010-12-14 NOTE — ED Provider Notes (Signed)
Christina Randall is a 25 y.o. female presenting for onset of labor. History HPI: Christina Randall is a 25 year old G2P1001 at 40.[redacted] weeks EGA who presents with consistent, severe uterine contractions. She denies leakage of fluid or bleeding. She denies headache and changes in vision, shortness of breath and chest pain. She is having nausea and polyuria.   Christina Randall has been receiving prenatal care from Dr. Emelda Fear. She denies any complications with her current pregnancy or with her previous.     OB History    Grav Para Term Preterm Abortions TAB SAB Ect Mult Living   2 1 1       1      Past Medical History  Diagnosis Date  . Herpes    Past Surgical History  Procedure Date  . No past surgeries    Family History: family history is not on file. Social History:  reports that she has never smoked. She does not have any smokeless tobacco history on file. She reports that she does not drink alcohol or use illicit drugs.  ROS Negative unless stated above.   Dilation: 3.5 Effacement (%): 90 Station: -2 Exam by:: Dr. Clinton Sawyer Blood pressure 148/81, pulse 94, temperature 98.6 F (37 C), temperature source Oral, resp. rate 20, height 5\' 9"  (1.753 m), weight 295 lb 6 oz (133.981 kg). Maternal Exam:  Uterine Assessment: Contraction strength is firm.  Contraction duration is 40 seconds. Contraction frequency is regular.   Abdomen: Patient reports generalized tenderness.  Fetal presentation: no presenting part  Introitus: Normal vulva. Vulva is negative for lesion.    Fetal Exam Fetal Monitor Review: Mode: fetoscope.   Baseline rate: 130.  Variability: moderate (6-25 bpm).   Pattern: no accelerations and no decelerations.    Fetal State Assessment: Category I - tracings are normal.     Physical Exam  Constitutional: She is oriented to person, place, and time. She appears well-developed and well-nourished. She appears distressed.  HENT:  Head: Normocephalic and atraumatic.    Eyes: Pupils are equal, round, and reactive to light.  Cardiovascular: Normal rate, regular rhythm and normal heart sounds.   Respiratory: Effort normal and breath sounds normal.  GI: There is generalized tenderness.  Genitourinary: There is no lesion on the right labia. There is no lesion on the left labia. Vulva exhibits no lesion.  Musculoskeletal: She exhibits no edema.  Neurological: She is alert and oriented to person, place, and time. No cranial nerve deficit.  Psychiatric: She has a normal mood and affect. Her behavior is normal. Thought content normal.    Prenatal labs: ABO, Rh: O/Positive/-- (03/20 0000) Antibody:   Rubella: Immune (03/20 0000) RPR: Nonreactive (03/20 0000)  HBsAg: Negative (03/20 0000)  HIV: Non-reactive (03/20 0000)  GBS: Positive (09/17 0000)   Assessment/Plan: Christina Randall is a 25 year old G2P1001 in active labor, with membranes intact.   1. Admit to [facility] under Dr. Macon Large, transfer to L&D Suite 2. Pain Control: Epidural PRN, Nubain 10 mg IV and IM q 3 hrs 3. Monitors: Tocometer and Fetoscope Continuous  4. FENGI: LR @ 125 mL/hr; clear liquid diet  Zahi Plaskett 12/14/2010, 3:33 AM

## 2010-12-14 NOTE — Progress Notes (Addendum)
  Subjective: Patient comfortable  Objective: BP 130/76  Pulse 91  Temp(Src) 98.1 F (36.7 C) (Oral)  Resp 18  Ht 5\' 9"  (1.753 m)  Wt 133.981 kg (295 lb 6 oz)  BMI 43.62 kg/m2      FHT:  FHR: 120-130 bpm, variability: moderate,  accelerations:  Present,  decelerations:  Absent UC:   irregular, every 3-5 minutes SVE:   Dilation: 7 Effacement (%): 90 Station: -1 Exam by:: Dr. Adrian Blackwater  Labs: Lab Results  Component Value Date   WBC 8.9 12/14/2010   HGB 10.6* 12/14/2010   HCT 32.3* 12/14/2010   MCV 77.3* 12/14/2010   PLT 252 12/14/2010    Assessment / Plan: Protracted active phase  Labor: IUPC placed, will start pitocin Preeclampsia:  na Fetal Wellbeing:  Category I Pain Control:  Epidural I/D:  PCN for GBS Anticipated MOD:  NSVD  Christina Randall JEHIEL 12/14/2010, 1:56 PM

## 2010-12-14 NOTE — ED Notes (Signed)
Up to bathroom

## 2010-12-14 NOTE — Progress Notes (Signed)
Subjective: Has epidrual.  Is comfortable.    Objective: BP 129/90  Pulse 94  Temp(Src) 97.9 F (36.6 C) (Oral)  Resp 18  Ht 5\' 9"  (1.753 m)  Wt 133.981 kg (295 lb 6 oz)  BMI 43.62 kg/m2      FHT:  FHR: 120 bpm, variability: moderate,  accelerations:  Present,  decelerations:  Absent UC:   regular, every 2-3 minutes SVE:   Dilation: 6 Effacement (%): 90 Station: -2 Exam by:: Dr. Adrian Blackwater  Labs: Lab Results  Component Value Date   WBC 8.9 12/14/2010   HGB 10.6* 12/14/2010   HCT 32.3* 12/14/2010   MCV 77.3* 12/14/2010   PLT 252 12/14/2010    Assessment / Plan: Spontaneous labor, progressing normally  Labor: AROM with light meconium Preeclampsia:  na Fetal Wellbeing:  Category I Pain Control:  Epidural I/D:  penicillin Anticipated MOD:  NSVD  Christina Randall 12/14/2010, 9:32 AM

## 2010-12-14 NOTE — Progress Notes (Signed)
Pt G2 P1 at 40.1wks, contracting and uncomfortable.  GBS +, hx HSV-taking valtrex.

## 2010-12-15 MED ORDER — TETANUS-DIPHTH-ACELL PERTUSSIS 5-2.5-18.5 LF-MCG/0.5 IM SUSP
0.5000 mL | Freq: Once | INTRAMUSCULAR | Status: AC
  Administered 2010-12-15: 0.5 mL via INTRAMUSCULAR
  Filled 2010-12-15: qty 0.5

## 2010-12-15 NOTE — Anesthesia Postprocedure Evaluation (Signed)
Anesthesia Post Note  Patient: Christina Randall  Procedure(s) Performed: * No procedures listed *  Anesthesia type: Epidural  Patient location: Mother/Baby  Post pain: Pain level controlled  Post assessment: Post-op Vital signs reviewed  Last Vitals:  Filed Vitals:   12/15/10 0200  BP: 117/77  Pulse: 105  Temp: 98 F (36.7 C)  Resp: 18    Post vital signs: Reviewed  Level of consciousness: awake  Complications: No apparent anesthesia complications

## 2010-12-15 NOTE — Progress Notes (Signed)
UR Chart review completed.  

## 2010-12-15 NOTE — Anesthesia Postprocedure Evaluation (Signed)
  Anesthesia Post-op Note  Patient: Christina Randall  Procedure(s) Performed: * No procedures listed *  Patient Location: Mother/Baby  Anesthesia Type: Epidural  Level of Consciousness: awake, alert, oriented  Airway and Oxygen Therapy: Patient Spontanous Breathing  Post-op Pain: none  Post-op Assessment: Post-op Vital signs reviewed, Patient's Cardiovascular Status Stable, No headache, No backache, No residual numbness and No residual motor weakness  Post-op Vital Signs: Reviewed and stable  Complications: No apparent anesthesia complications

## 2010-12-15 NOTE — Progress Notes (Signed)
Post Partum Day 1 Subjective: no complaints, up ad lib, voiding and tolerating PO, small lochia,  Breastfeeding well,plans Nexplanon  Objective: Blood pressure 107/63, pulse 72, temperature 98.5 F (36.9 C), temperature source Oral, resp. rate 18, height 5\' 9"  (1.753 m), weight 133.981 kg (295 lb 6 oz), SpO2 97.00%, unknown if currently breastfeeding.  Physical Exam:  General: alert, cooperative and no distress Lochia:normal flow Chest: CTAB Heart: RRR no m/r/g Abdomen: +BS, soft, nontender,  Uterine Fundus: firm @ U-2 DVT Evaluation: No evidence of DVT seen on physical exam. Extremities: 1 edema   Basename 12/14/10 0415  HGB 10.6*  HCT 32.3*    Assessment/Plan: Plan for discharge tomorrow   LOS: 1 day   CRESENZO-DISHMAN,Haide Klinker 12/15/2010, 7:39 AM

## 2010-12-15 NOTE — Progress Notes (Signed)
Encounter addended by: Madison Hickman on: 12/15/2010  8:28 AM<BR>     Documentation filed: Notes Section, Charges VN

## 2010-12-16 MED ORDER — IBUPROFEN 600 MG PO TABS: 600.0000 mg | ORAL_TABLET | Freq: Four times a day (QID) | ORAL | Status: AC

## 2010-12-16 MED ORDER — PRENATAL PLUS 27-1 MG PO TABS: 1.0000 | ORAL_TABLET | Freq: Every day | ORAL | Status: DC

## 2010-12-16 NOTE — Progress Notes (Signed)
Post Partum Day 2 Subjective: no complaints, up ad lib, voiding, tolerating PO and + flatus. Patient is curious about how long her breast milk will be good for if she pumps and refrigerate/freezes it. She is also interested in continuing the percocet at home for cramping and contractions induced by suckling.   Objective: Blood pressure 107/70, pulse 70, temperature 98.1 F (36.7 C), temperature source Oral, resp. rate 18, height 5\' 9"  (1.753 m), weight 133.981 kg (295 lb 6 oz), SpO2 97.00%, unknown if currently breastfeeding.  Physical Exam:  General: alert, cooperative and no distress Lochia: appropriate Uterine Fundus: firm DVT Evaluation: No evidence of DVT seen on physical exam. Negative Homan's sign. No cords or calf tenderness. No significant calf/ankle edema.   Basename 12/14/10 0415  HGB 10.6*  HCT 32.3*    Assessment/Plan: Discharge home and Breastfeeding. Patient has had lactation consult, but still with questions. PP care at [facility] with Dr. Emelda Fear. Implenon for contraception to be [facility] at [facility].   LOS: 2 days   Janeece Riggers 12/16/2010, 9:31 AM

## 2010-12-16 NOTE — Discharge Summary (Signed)
Obstetric Discharge Summary Reason for Admission: onset of labor Prenatal Procedures: none Intrapartum Procedures: spontaneous vaginal delivery Postpartum Procedures: none Complications-Operative and Postpartum: none Hemoglobin  Date Value Range Status  12/14/2010 10.6* 12.0-15.0 (g/dL) Final     HCT  Date Value Range Status  12/14/2010 32.3* 36.0-46.0 (%) Final    Discharge Diagnoses: Term Pregnancy-delivered  Discharge Information: Date: 12/16/2010 Activity: unrestricted Diet: routine Medications: PNV, Ibuprofen, Colace and Percocet Condition: stable Instructions: refer to practice specific booklet Discharge to: home   Newborn Data: Live born female  Birth Weight: 8 lb 5.2 oz (3776 g) APGAR: 9, 9  Home with mother.  Christina Randall 12/16/2010, 9:40 AM

## 2010-12-16 NOTE — Discharge Summary (Signed)
I have seen and examined this patient in conjunction with Janeece Riggers, PA-S.  I have taken this history and preformed the exam.  I agree with the note as written above and have made corrections as needed.   Christina Randall 12/16/2010 10:39 AM

## 2010-12-16 NOTE — Progress Notes (Signed)
I have seen and examined this patient in conjunction with Steven Miles, PA-S.  I have taken this history and preformed the exam.  I agree with the note as written above and have made corrections as needed.   Randall, Christina JEHIEL 12/16/2010 10:39 AM   

## 2010-12-20 NOTE — ED Provider Notes (Signed)
I have seen and examined this patient and I agree with the above. Cam Hai 8:48 AM 12/20/2010

## 2011-08-12 IMAGING — CR DG LUMBAR SPINE COMPLETE 4+V
5 series · 5 of 5 positions shown · non-contrast
Comparison: 11/06/2008.

CLINICAL DATA: Pain extending from the low back into the tail bone.

LUMBAR SPINE - COMPLETE 4+ VIEW

[view not recorded (1 of 5)]
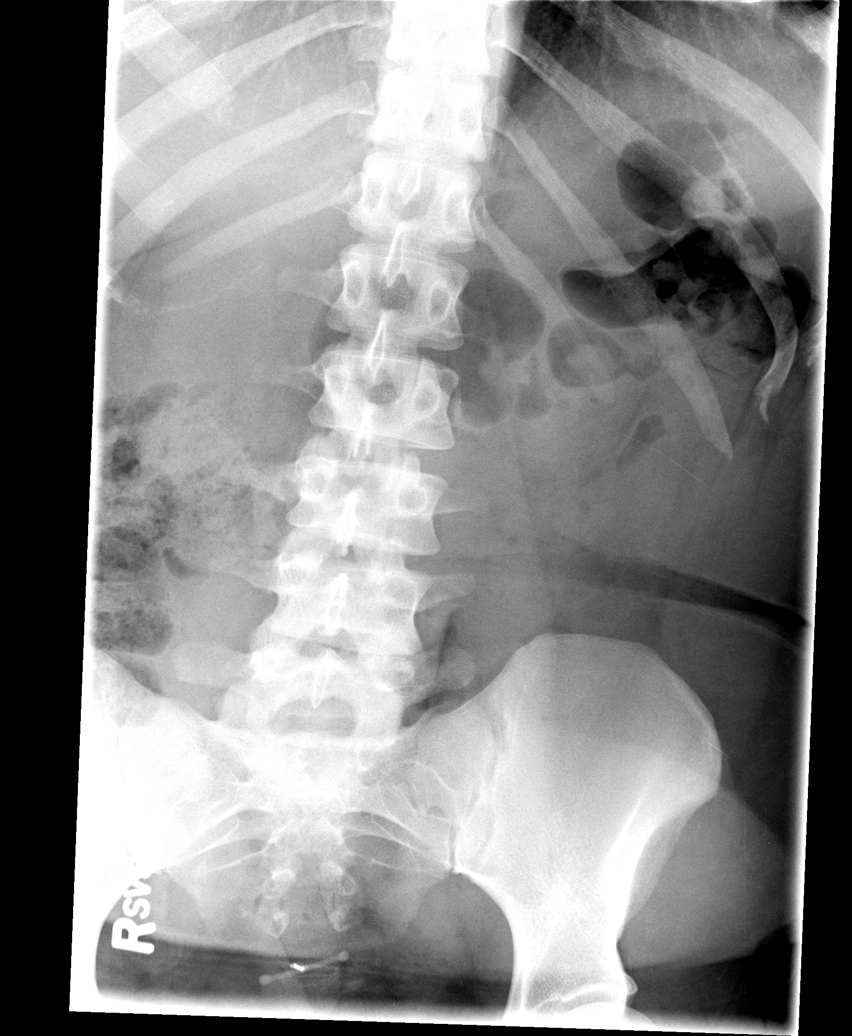

[view not recorded (2 of 5)]
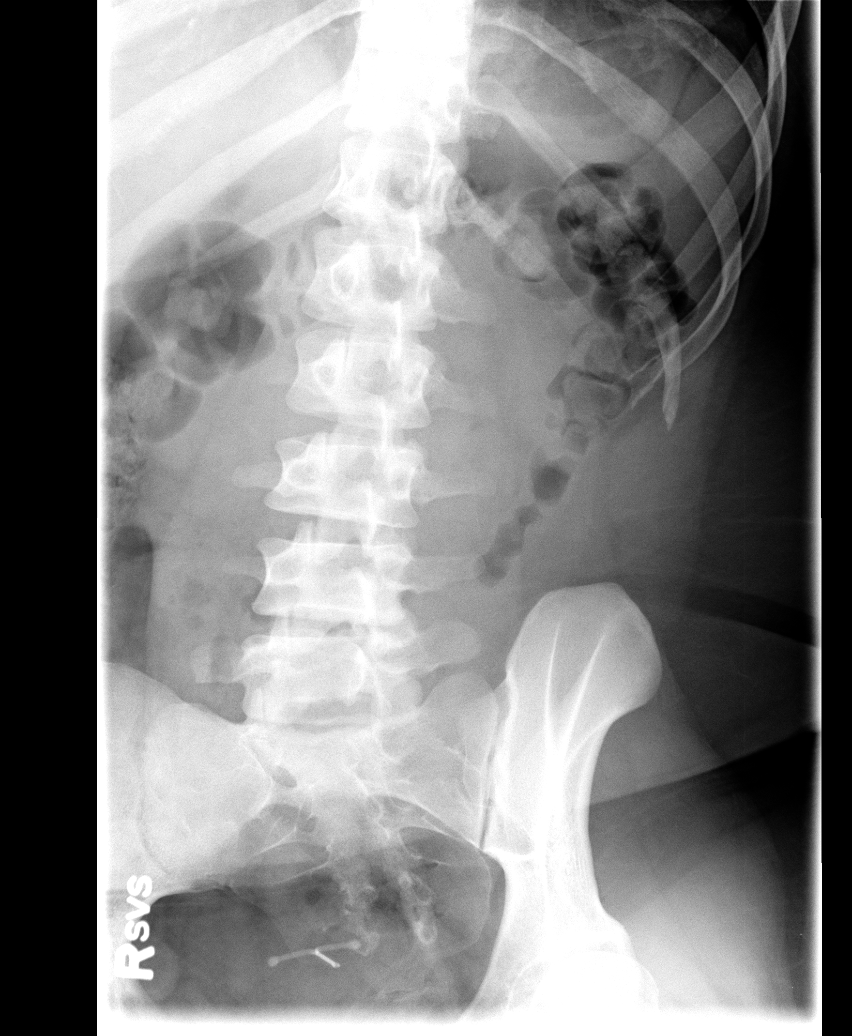

[view not recorded (3 of 5)]
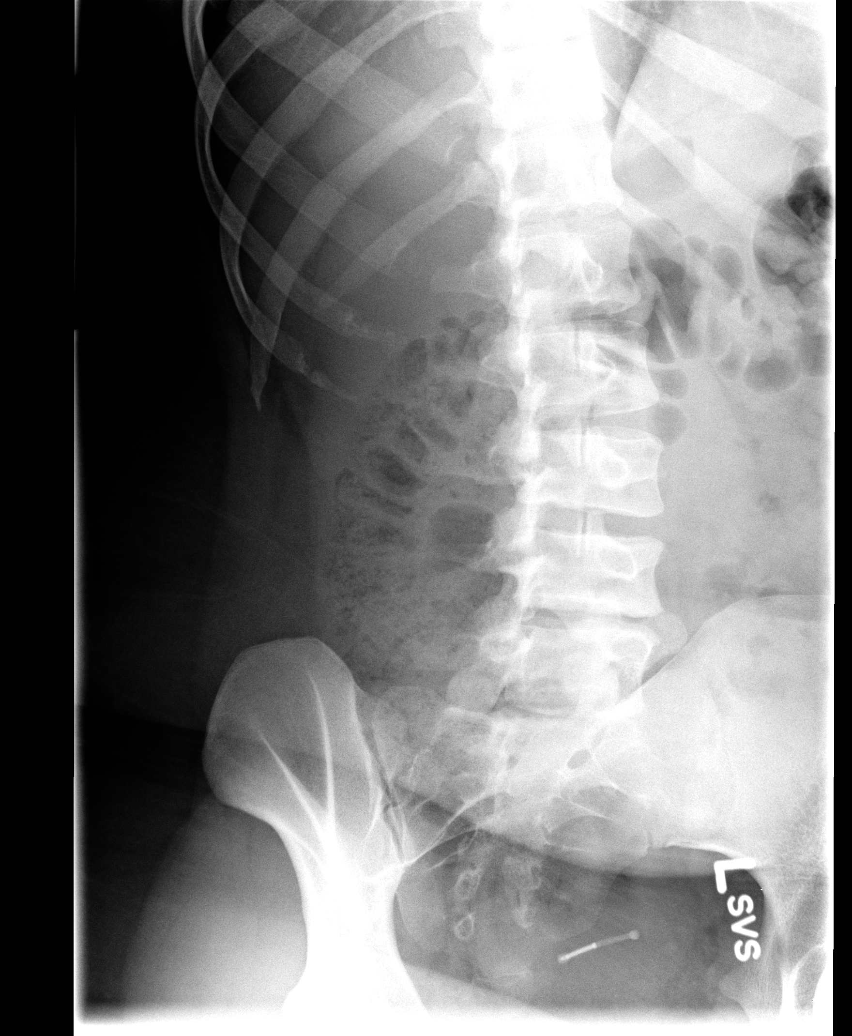

[view not recorded (4 of 5)]
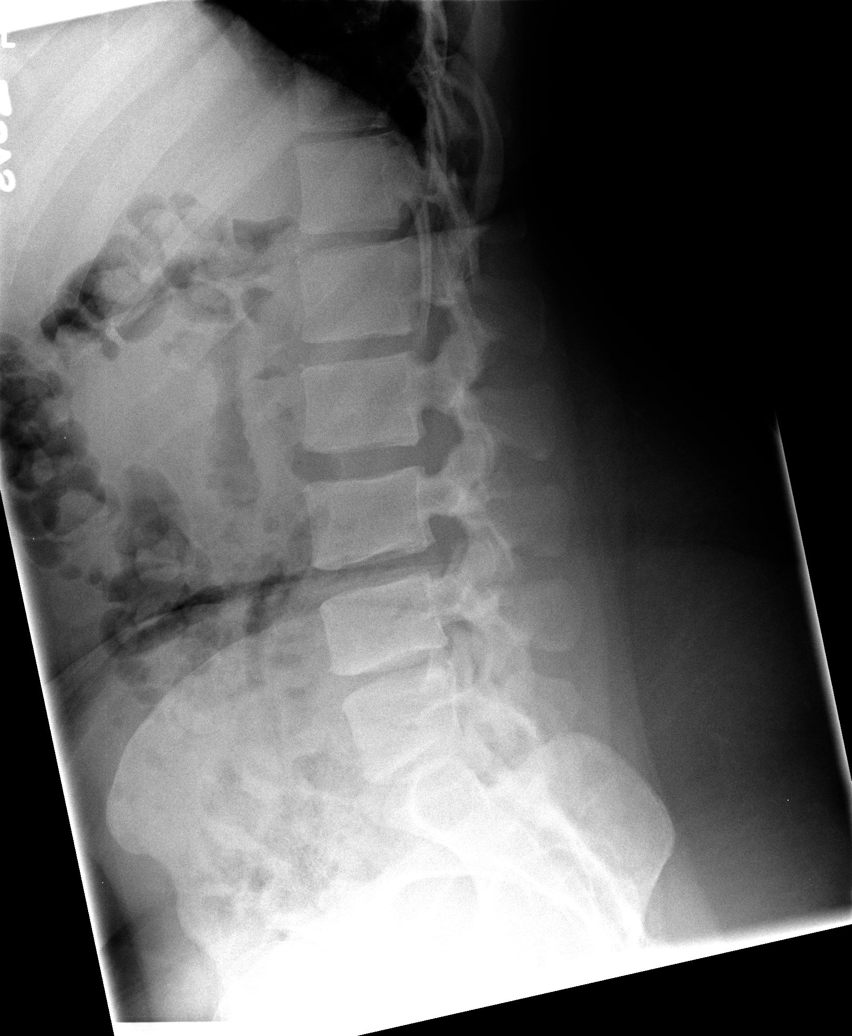

[view not recorded (5 of 5)]
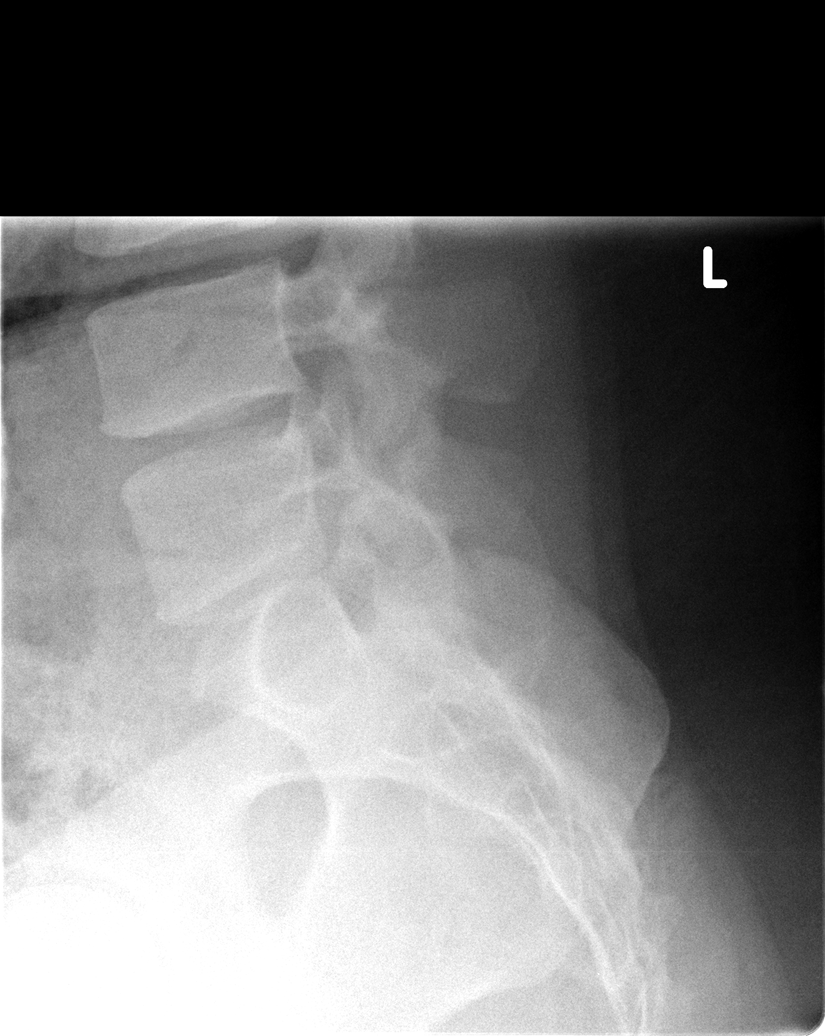

[5 of 5 positions shown; findings below may reference images not displayed]

FINDINGS: Five non-rib bearing lumbar type vertebral bodies are
present.  The vertebral body heights and alignment are maintained.
Incidental note is made of an IUD.
IMPRESSION: No acute abnormality or significant interval change.

## 2011-08-12 IMAGING — CR DG SACRUM/COCCYX 2+V
3 series · 3 of 3 positions shown · non-contrast
Comparison: Lumbar spine films of same date.

CLINICAL DATA: Pain.

SACRUM AND COCCYX - 2+ VIEW

[view not recorded (1 of 3)]
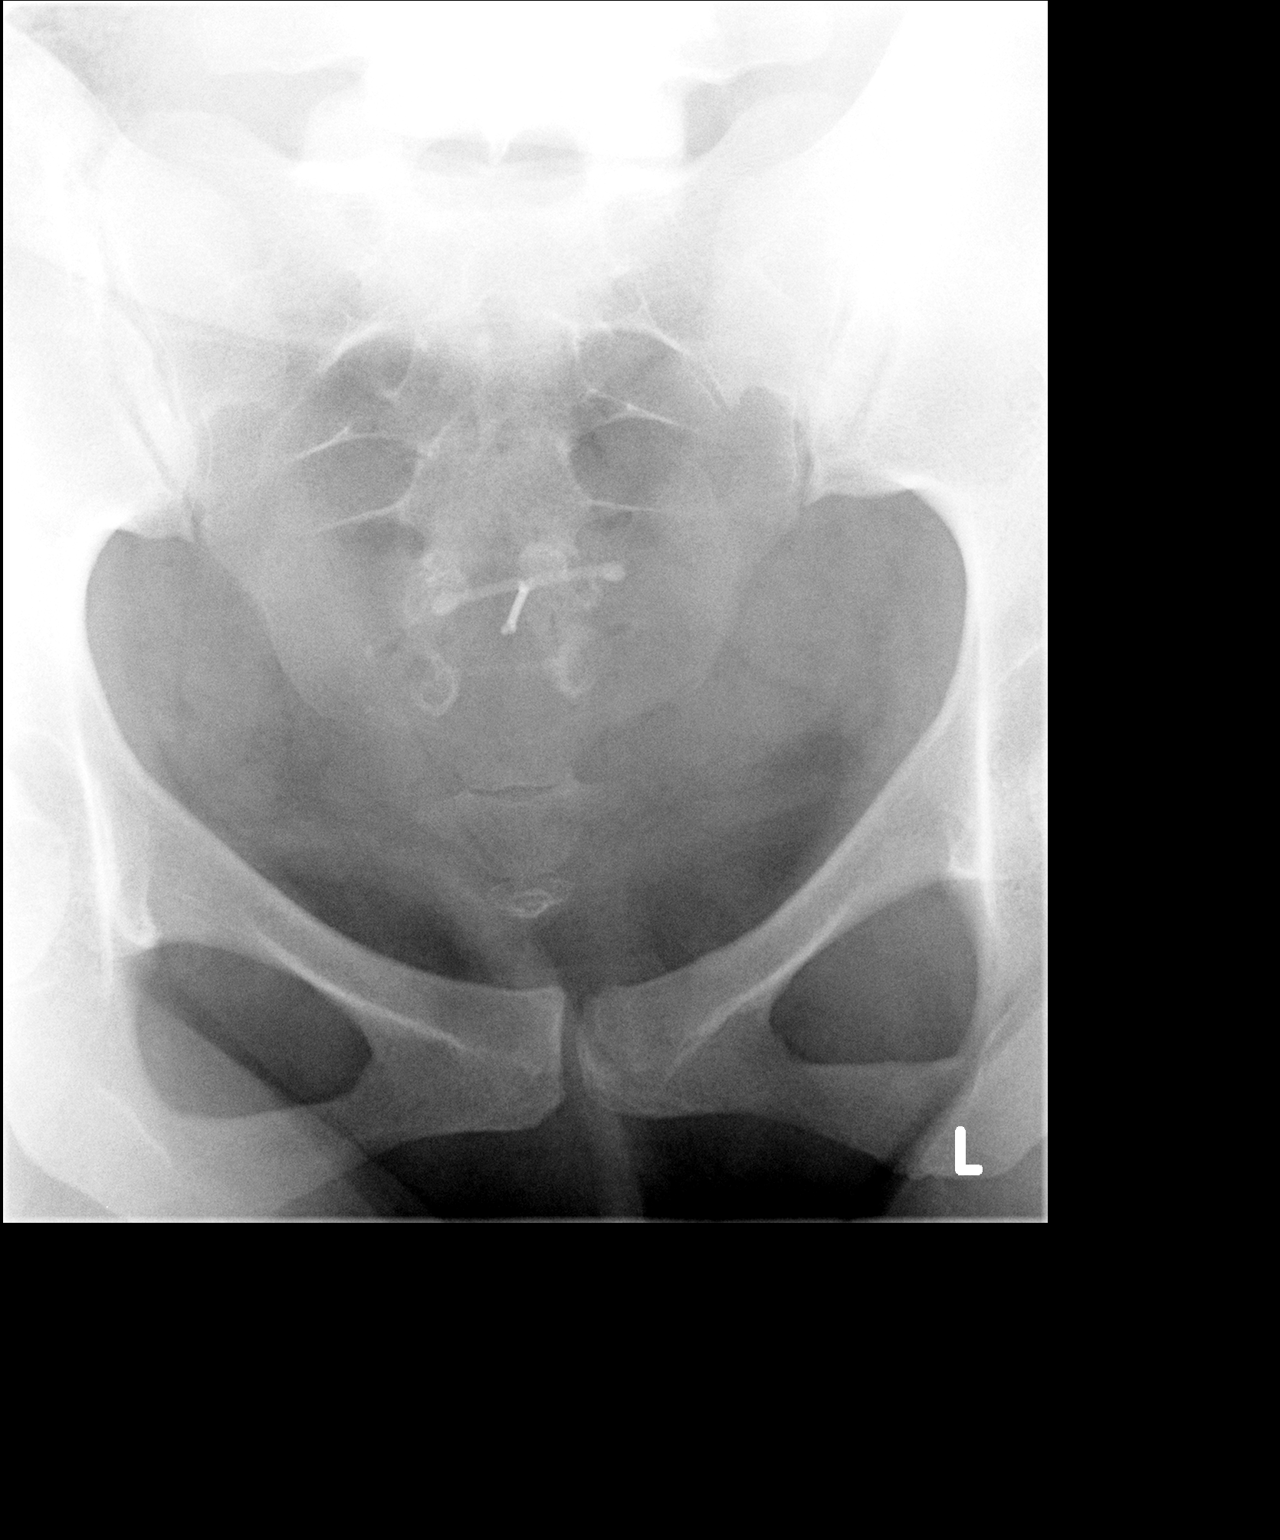

[view not recorded (2 of 3)]
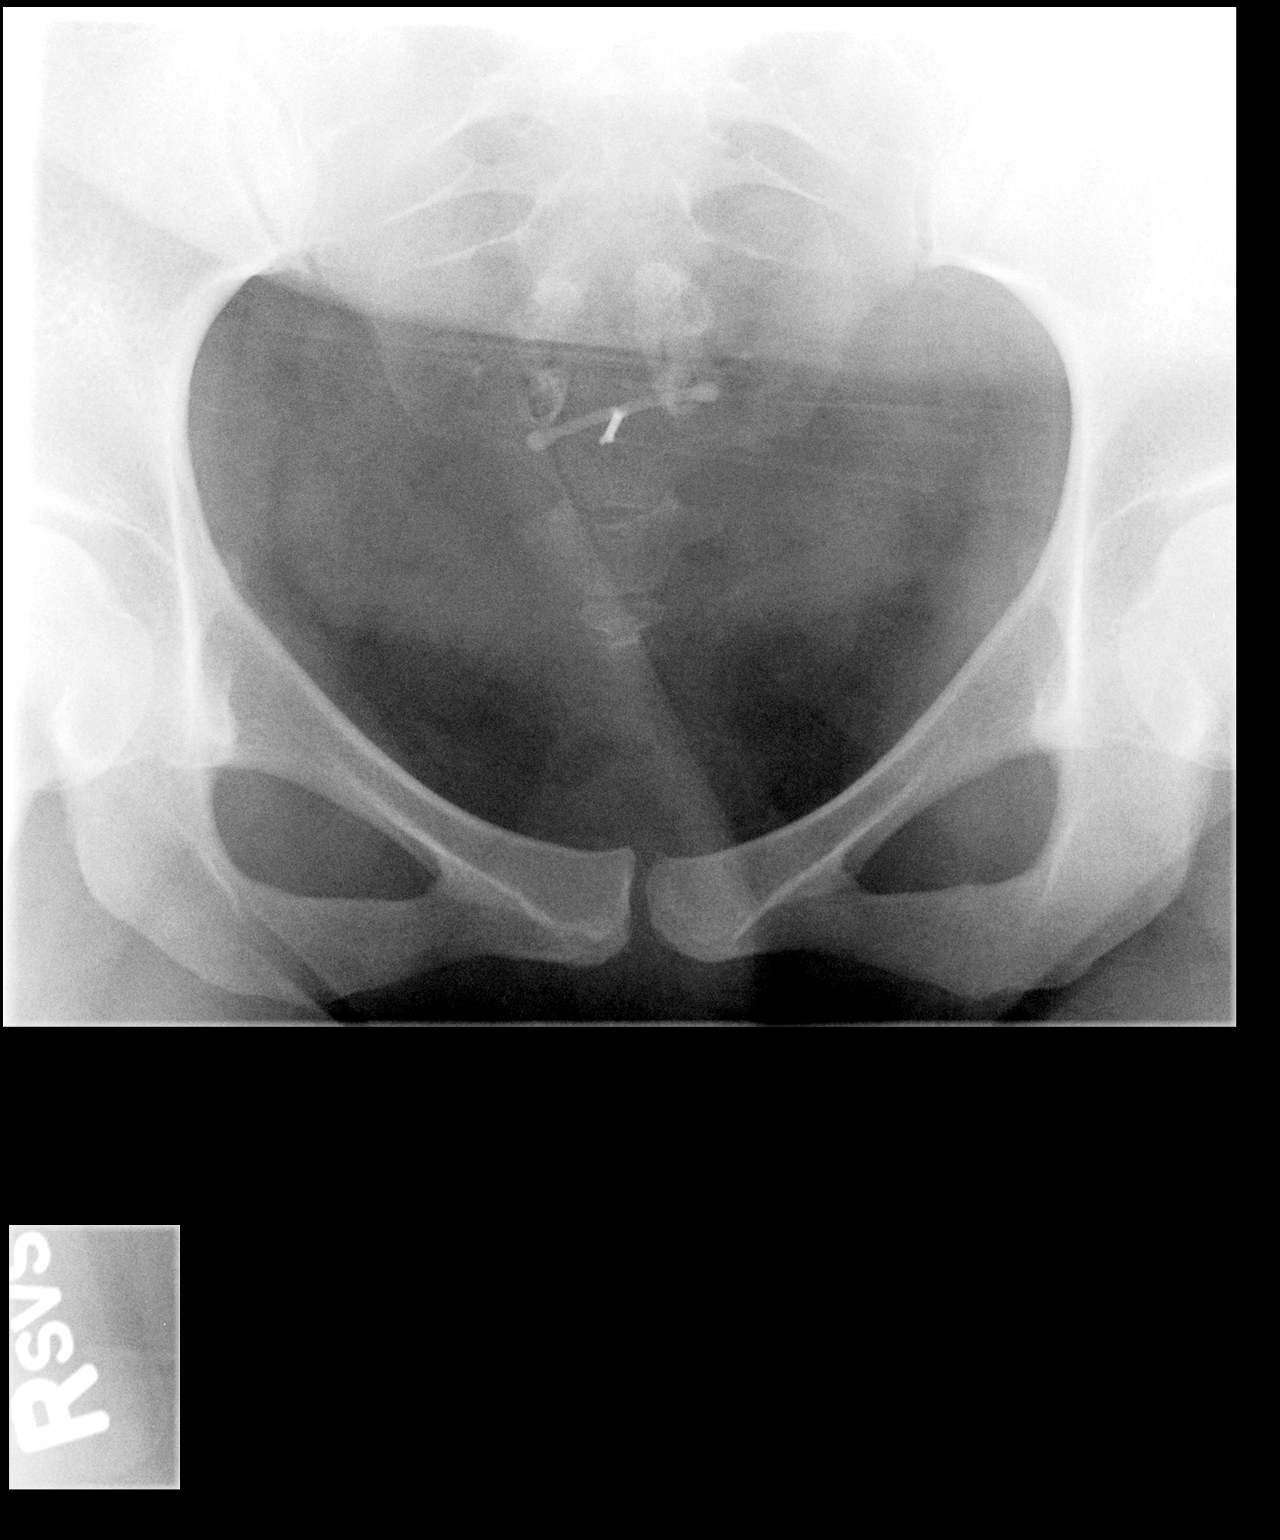

[view not recorded (3 of 3)]
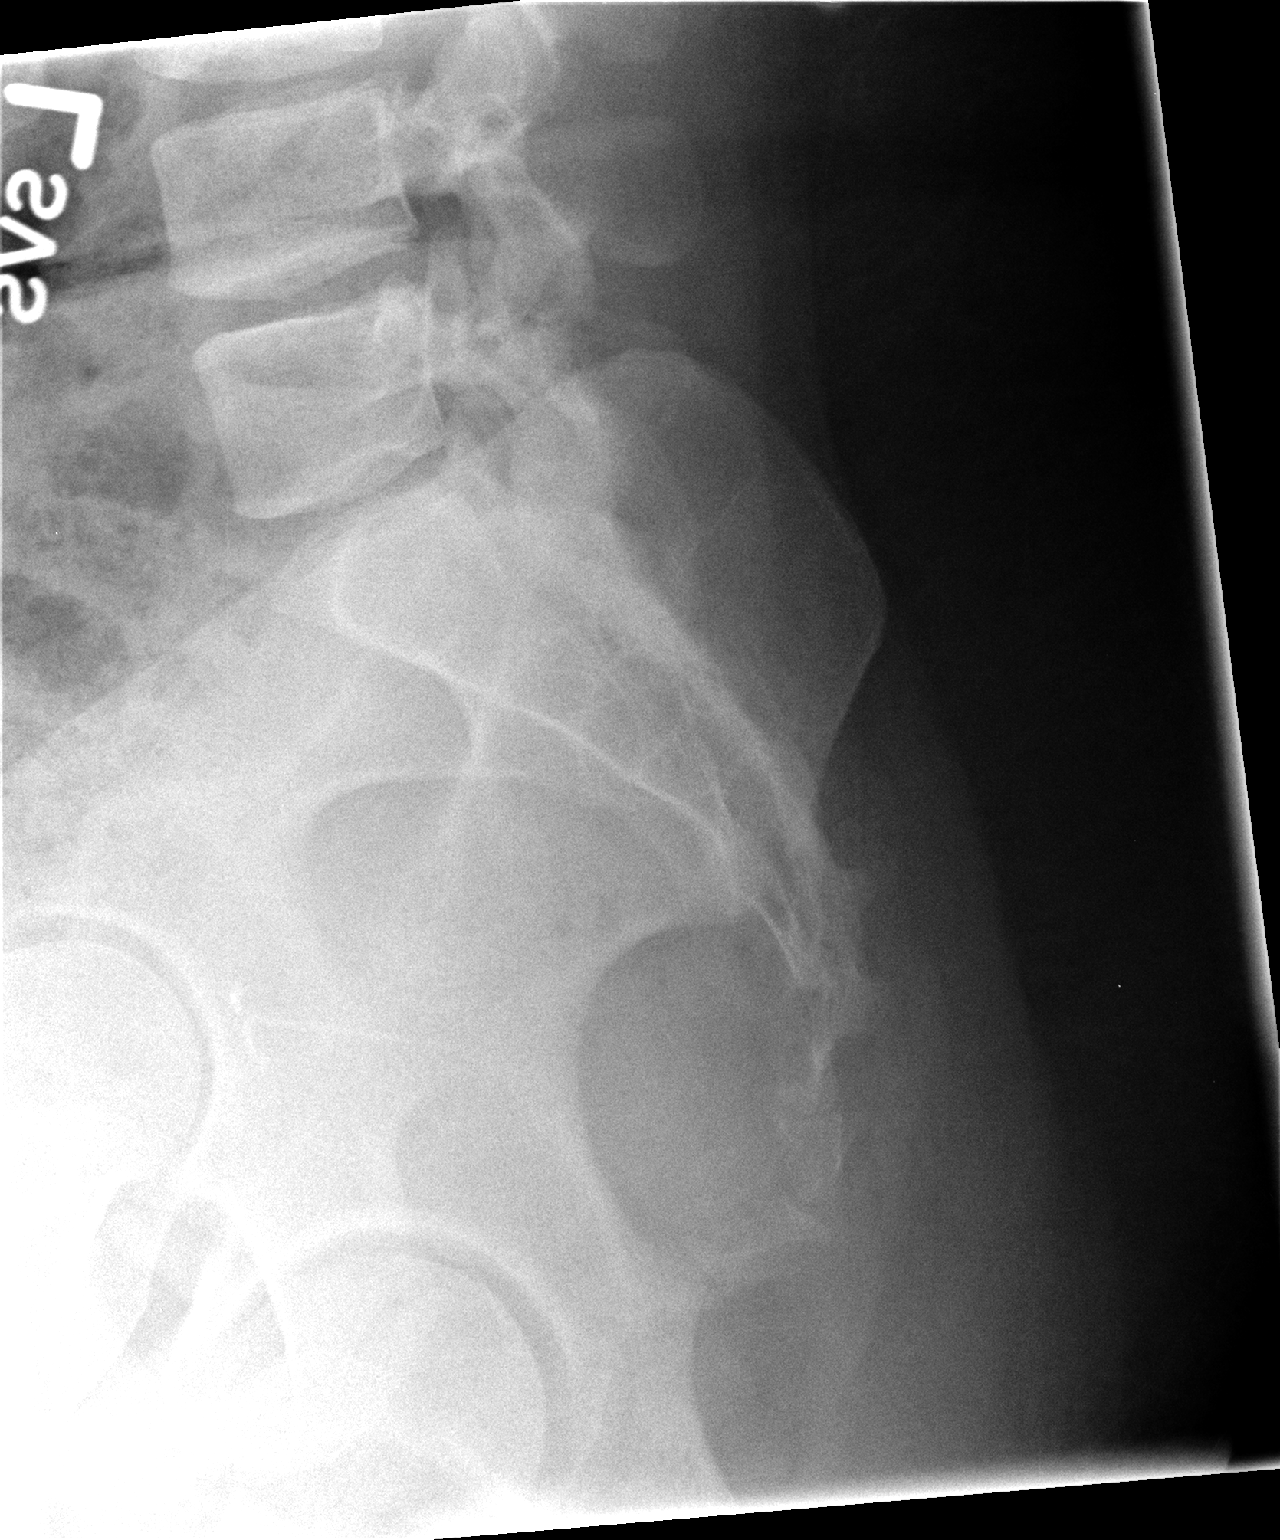

[3 of 3 positions shown; findings below may reference images not displayed]

FINDINGS: Three views of the sacrum demonstrate that the distal
sacral segments are displaced anteriorly on the lateral view.  This
may represent a focal sacral coccygeal fracture.  The superior
aspect the sacrum is intact.
IMPRESSION: 1.  Probable anterior displaced sacral distal sacrococcygeal
fracture.

## 2011-09-18 ENCOUNTER — Emergency Department (HOSPITAL_COMMUNITY)
Admission: EM | Admit: 2011-09-18 | Discharge: 2011-09-18 | Disposition: A | Discharge status: Home / Self Care | Payer: Medicaid Other | Attending: Emergency Medicine | Admitting: Emergency Medicine

## 2011-09-18 ENCOUNTER — Encounter (HOSPITAL_COMMUNITY): Payer: Self-pay | Admitting: *Deleted

## 2011-09-18 DIAGNOSIS — X500XXA Overexertion from strenuous movement or load, initial encounter: Secondary | ICD-10-CM | POA: Insufficient documentation

## 2011-09-18 DIAGNOSIS — S39012A Strain of muscle, fascia and tendon of lower back, initial encounter: Secondary | ICD-10-CM

## 2011-09-18 DIAGNOSIS — J45909 Unspecified asthma, uncomplicated: Secondary | ICD-10-CM | POA: Insufficient documentation

## 2011-09-18 DIAGNOSIS — Z79899 Other long term (current) drug therapy: Secondary | ICD-10-CM | POA: Insufficient documentation

## 2011-09-18 DIAGNOSIS — M545 Low back pain, unspecified: Secondary | ICD-10-CM | POA: Insufficient documentation

## 2011-09-18 DIAGNOSIS — S335XXA Sprain of ligaments of lumbar spine, initial encounter: Secondary | ICD-10-CM | POA: Insufficient documentation

## 2011-09-18 HISTORY — DX: Unspecified asthma, uncomplicated: J45.909

## 2011-09-18 MED ORDER — METHOCARBAMOL 500 MG PO TABS: ORAL_TABLET | ORAL | Status: DC

## 2011-09-18 MED ORDER — HYDROCODONE-ACETAMINOPHEN 5-325 MG PO TABS: ORAL_TABLET | ORAL | Status: DC

## 2011-09-18 MED ORDER — MELOXICAM 7.5 MG PO TABS: ORAL_TABLET | ORAL | Status: DC

## 2011-09-18 NOTE — ED Notes (Signed)
Pt c/o pain in her lower back after bending over to pick up something out of the floor this morning. Pt states that the pain radiates to her right leg. Pt alert and oriented x 3. Skin warm and dry. Color pink. No acute distress.

## 2011-09-18 NOTE — ED Provider Notes (Signed)
Medical screening examination/treatment/procedure(s) were performed by non-physician practitioner and as supervising physician I was immediately available for consultation/collaboration.   Keshara Kiger, MD 09/18/11 1545 

## 2011-09-18 NOTE — ED Notes (Signed)
Low back pain since this am, when bent down to pick up something from floor. Hx of back pain

## 2011-09-18 NOTE — ED Provider Notes (Signed)
History     CSN: [id number]  Arrival date & time 09/18/11  1405   None     Chief Complaint  Patient presents with  . Back Pain    (Consider location/radiation/quality/duration/timing/severity/associated sxs/prior treatment) Patient is a 26 y.o. female presenting with back pain. The history is provided by the patient.  Back Pain  This is a new problem. The current episode started 3 to 5 hours ago. The problem occurs hourly. The problem has been gradually worsening. Associated with: Pt was bending when she felt a pop in the lower back. The pain is present in the lumbar spine. The quality of the pain is described as aching and shooting. The pain is moderate. The symptoms are aggravated by bending, twisting and certain positions. The pain is the same all the time. Pertinent negatives include no numbness, no bowel incontinence, no perianal numbness, no bladder incontinence and no paresthesias. She has tried NSAIDs for the symptoms. The treatment provided no relief. Risk factors include obesity.    Past Medical History  Diagnosis Date  . Herpes   . No pertinent past medical history   . Asthma     Past Surgical History  Procedure Date  . No past surgeries     History reviewed. No pertinent family history.  History  Substance Use Topics  . Smoking status: Never Smoker   . Smokeless tobacco: Not on file  . Alcohol Use: No    OB History    Grav Para Term Preterm Abortions TAB SAB Ect Mult Living   2 2 2       2       Review of Systems  Gastrointestinal: Negative for bowel incontinence.  Genitourinary: Negative for bladder incontinence.  Musculoskeletal: Positive for back pain.  Neurological: Negative for numbness and paresthesias.    Allergies  Review of patient's allergies indicates no known allergies.  Home Medications   Current Outpatient Rx  Name Route Sig Dispense Refill  . IBUPROFEN 200 MG PO CAPS Oral Take 2 capsules by mouth as needed. For pain relief    .  HYDROCODONE-ACETAMINOPHEN 5-325 MG PO TABS  1 or 2 po q4h prn pain 15 tablet 0  . MELOXICAM 7.5 MG PO TABS  1 po bid with food 12 tablet 0  . METHOCARBAMOL 500 MG PO TABS  2 po tid for spasm 30 tablet 0    BP 126/75  Pulse 86  Temp 98.5 F (36.9 C) (Oral)  Resp 18  Ht 5\' 9"  (1.753 m)  Wt 265 lb (120.203 kg)  BMI 39.13 kg/m2  SpO2 98%  Breastfeeding? No  Physical Exam  Nursing note and vitals reviewed. Constitutional: She is oriented to person, place, and time. She appears well-developed and well-nourished.  Non-toxic appearance.  HENT:  Head: Normocephalic.  Right Ear: Tympanic membrane and external ear normal.  Left Ear: Tympanic membrane and external ear normal.  Eyes: EOM and lids are normal. Pupils are equal, round, and reactive to light.  Neck: Normal range of motion. Neck supple. Carotid bruit is not present.  Cardiovascular: Normal rate, regular rhythm, normal heart sounds, intact distal pulses and normal pulses.   Pulmonary/Chest: Breath sounds normal. No respiratory distress.  Abdominal: Soft. Bowel sounds are normal. There is no tenderness. There is no guarding.  Musculoskeletal: Normal range of motion.       Right lower lumbar area pain to palpation and with attempted range of motion.  Lymphadenopathy:       Head (right side): No  submandibular adenopathy present.       Head (left side): No submandibular adenopathy present.    She has no cervical adenopathy.  Neurological: She is alert and oriented to person, place, and time. She has normal strength. No cranial nerve deficit or sensory deficit. She exhibits normal muscle tone. Coordination normal.  Skin: Skin is warm and dry.  Psychiatric: She has a normal mood and affect. Her speech is normal.    ED Course  Procedures (including critical care time)  Labs Reviewed - No data to display No results found. Pulse oximetry 98% on room air. Within normal limits by my interpretation.  1. Lumbar strain       MDM    I have reviewed nursing notes, vital signs, and all appropriate lab and imaging results for this patient. Examination is consistent with a lumbar strain. No acute deficits appreciated at this time. Patient advised to use a heating pad to the lower back. Prescription for Norco 5 mg #15 tablets, Mobic 7.5 mg and Robaxin 500 mg given to the patient.       Kathie Dike, [place] 09/18/11 1525

## 2011-09-25 ENCOUNTER — Encounter (HOSPITAL_COMMUNITY): Payer: Self-pay | Admitting: *Deleted

## 2011-09-25 ENCOUNTER — Emergency Department (HOSPITAL_COMMUNITY)
Admission: EM | Admit: 2011-09-25 | Discharge: 2011-09-25 | Disposition: A | Discharge status: Home / Self Care | Payer: Medicaid Other | Attending: Emergency Medicine | Admitting: Emergency Medicine

## 2011-09-25 DIAGNOSIS — J029 Acute pharyngitis, unspecified: Secondary | ICD-10-CM | POA: Insufficient documentation

## 2011-09-25 DIAGNOSIS — J45909 Unspecified asthma, uncomplicated: Secondary | ICD-10-CM | POA: Insufficient documentation

## 2011-09-25 HISTORY — DX: Dorsalgia, unspecified: M54.9

## 2011-09-25 HISTORY — DX: Scoliosis, unspecified: M41.9

## 2011-09-25 LAB — RAPID STREP SCREEN (MED CTR MEBANE ONLY): Streptococcus, Group A Screen (Direct): POSITIVE — AB

## 2011-09-25 MED ORDER — PENICILLIN G BENZATHINE 1200000 UNIT/2ML IM SUSP
1.2000 10*6.[IU] | Freq: Once | INTRAMUSCULAR | Status: AC
  Administered 2011-09-25: 1.2 10*6.[IU] via INTRAMUSCULAR
  Filled 2011-09-25: qty 2

## 2011-09-25 NOTE — ED Provider Notes (Signed)
History     CSN: [id number]  Arrival date & time 09/25/11  1347   First MD Initiated Contact with Patient 09/25/11 1550      Chief Complaint  Patient presents with  . Sore Throat    (Consider location/radiation/quality/duration/timing/severity/associated sxs/prior treatment) HPI Comments: Sore throat  And headache x 2 days.  Patient is a 26 y.o. female presenting with pharyngitis. The history is provided by the patient. No language interpreter was used.  Sore Throat This is a new problem. Episode onset: 2 days ago. The problem has been gradually worsening. Associated symptoms include a fever and a sore throat. Associated symptoms comments: Headache . The symptoms are aggravated by swallowing.    Past Medical History  Diagnosis Date  . Herpes   . No pertinent past medical history   . Asthma   . Back pain   . Scoliosis     Past Surgical History  Procedure Date  . No past surgeries     History reviewed. No pertinent family history.  History  Substance Use Topics  . Smoking status: Never Smoker   . Smokeless tobacco: Not on file  . Alcohol Use: No    OB History    Grav Para Term Preterm Abortions TAB SAB Ect Mult Living   2 2 2       2       Review of Systems  Constitutional: Positive for fever.  HENT: Positive for sore throat.     Allergies  Review of patient's allergies indicates no known allergies.  Home Medications   Current Outpatient Rx  Name Route Sig Dispense Refill  . HYDROCODONE-ACETAMINOPHEN 5-325 MG PO TABS Oral Take 1-2 tablets by mouth every 4 (four) hours as needed. For pain    . IBUPROFEN 200 MG PO CAPS Oral Take 2 capsules by mouth as needed. For pain relief    . MELOXICAM 7.5 MG PO TABS Oral Take 7.5 mg by mouth 2 (two) times daily. 1 po bid with food    . METHOCARBAMOL 500 MG PO TABS Oral Take 1,000 mg by mouth 3 (three) times daily as needed. for spasm      BP 138/78  Pulse 100  Temp 99.7 F (37.6 C) (Oral)  Resp 18  Ht 5\' 9"   (1.753 m)  Wt 265 lb (120.203 kg)  BMI 39.13 kg/m2  SpO2 100%  Physical Exam  Nursing note and vitals reviewed. Constitutional: She is oriented to person, place, and time. She appears well-developed and well-nourished. No distress.  HENT:  Head: Normocephalic and atraumatic.  Mouth/Throat: Uvula is midline and mucous membranes are normal. No dental abscesses, uvula swelling or dental caries. Oropharyngeal exudate and posterior oropharyngeal erythema present. No posterior oropharyngeal edema or tonsillar abscesses.  Eyes: EOM are normal.  Neck: Normal range of motion.  Cardiovascular: Normal rate, regular rhythm and normal heart sounds.   Pulmonary/Chest: Effort normal and breath sounds normal.  Abdominal: Soft. She exhibits no distension. There is no tenderness.  Musculoskeletal: Normal range of motion.  Lymphadenopathy:    She has cervical adenopathy.       Right cervical: Superficial cervical and deep cervical adenopathy present.       Left cervical: Superficial cervical and deep cervical adenopathy present.  Neurological: She is alert and oriented to person, place, and time.  Skin: Skin is warm and dry.  Psychiatric: She has a normal mood and affect. Judgment normal.    ED Course  Procedures (including critical care time)  Labs Reviewed  RAPID STREP SCREEN - Abnormal; Notable for the following:    Streptococcus, Group A Screen (Direct) POSITIVE (*)     All other components within normal limits   No results found.   1. Pharyngitis, acute       MDM  Bicillin LA 1.2 millio units Tylenol or ibuprofen F/u with your PCP        Evalina Field, PA 09/25/11 1646

## 2011-09-25 NOTE — ED Notes (Signed)
Pt c/o sore throat, low grade fever, nausea, dizziness that started last night,

## 2011-09-25 NOTE — ED Notes (Signed)
Sore throat and feels "light headed." here with her infant that has a fever.

## 2011-09-27 ENCOUNTER — Emergency Department (HOSPITAL_COMMUNITY)
Admission: EM | Admit: 2011-09-27 | Discharge: 2011-09-27 | Disposition: A | Discharge status: Home / Self Care | Payer: Medicaid Other | Attending: Emergency Medicine | Admitting: Emergency Medicine

## 2011-09-27 ENCOUNTER — Emergency Department (HOSPITAL_COMMUNITY): Payer: Medicaid Other

## 2011-09-27 ENCOUNTER — Encounter (HOSPITAL_COMMUNITY): Payer: Self-pay | Admitting: *Deleted

## 2011-09-27 DIAGNOSIS — J020 Streptococcal pharyngitis: Secondary | ICD-10-CM | POA: Insufficient documentation

## 2011-09-27 DIAGNOSIS — J45909 Unspecified asthma, uncomplicated: Secondary | ICD-10-CM | POA: Insufficient documentation

## 2011-09-27 MED ORDER — OXYCODONE-ACETAMINOPHEN 5-325 MG PO TABS
1.0000 | ORAL_TABLET | Freq: Once | ORAL | Status: AC
  Administered 2011-09-27: 1 via ORAL
  Filled 2011-09-27: qty 1

## 2011-09-27 MED ORDER — HYDROCODONE-ACETAMINOPHEN 7.5-500 MG PO TABS: 1.0000 | ORAL_TABLET | ORAL | Status: AC | PRN

## 2011-09-27 MED ORDER — IOHEXOL 300 MG/ML  SOLN
75.0000 mL | Freq: Once | INTRAMUSCULAR | Status: AC | PRN
  Administered 2011-09-27: 75 mL via INTRAVENOUS

## 2011-09-27 MED ORDER — AMOXICILLIN 500 MG PO CAPS: 500.0000 mg | ORAL_CAPSULE | Freq: Three times a day (TID) | ORAL | Status: AC

## 2011-09-27 MED ORDER — PENICILLIN V POTASSIUM 250 MG PO TABS
500.0000 mg | ORAL_TABLET | Freq: Once | ORAL | Status: AC
  Administered 2011-09-27: 500 mg via ORAL
  Filled 2011-09-27: qty 2

## 2011-09-27 MED ORDER — ONDANSETRON 4 MG PO TBDP
4.0000 mg | ORAL_TABLET | Freq: Once | ORAL | Status: AC
  Administered 2011-09-27: 4 mg via ORAL
  Filled 2011-09-27: qty 1

## 2011-09-27 MED ORDER — KETOROLAC TROMETHAMINE 10 MG PO TABS
10.0000 mg | ORAL_TABLET | Freq: Once | ORAL | Status: AC
  Administered 2011-09-27: 10 mg via ORAL
  Filled 2011-09-27: qty 1

## 2011-09-27 MED ORDER — MELOXICAM 7.5 MG PO TABS: ORAL_TABLET | ORAL | Status: DC

## 2011-09-27 NOTE — ED Provider Notes (Signed)
Medical screening examination/treatment/procedure(s) were performed by non-physician practitioner and as supervising physician I was immediately available for consultation/collaboration.   Viraat Vanpatten L Avaleigh Decuir, MD 09/27/11 2210 

## 2011-09-27 NOTE — ED Notes (Signed)
Sore throat, bil ear pain. , Nausea, no fever.

## 2011-09-27 NOTE — ED Notes (Signed)
C/o sore throat, states she had treatment for strep earlier this week and has not improved

## 2011-09-27 NOTE — ED Provider Notes (Signed)
History     CSN: [id number]  Arrival date & time 09/27/11  2021   First MD Initiated Contact with Patient 09/27/11 2036      Chief Complaint  Patient presents with  . Sore Throat    (Consider location/radiation/quality/duration/timing/severity/associated sxs/prior treatment) HPI Comments: Patient is a 26 year old female who was diagnosed with strep pharyngitis on 722. The patient was treated with Bicillin L. A. But continues to have low-grade fever, problems swallowing, and swelling of the throat. Patient states that on last evening while holding her neck in certain positions she felt as though she could not get a good deep breath. Patient presents for reevaluation concerning her throat.  Patient is a 26 y.o. female presenting with pharyngitis. The history is provided by the patient.  Sore Throat Pertinent negatives include no abdominal pain, arthralgias, chest pain, coughing or neck pain.    Past Medical History  Diagnosis Date  . Herpes   . No pertinent past medical history   . Asthma   . Back pain   . Scoliosis     Past Surgical History  Procedure Date  . No past surgeries     History reviewed. No pertinent family history.  History  Substance Use Topics  . Smoking status: Never Smoker   . Smokeless tobacco: Not on file  . Alcohol Use: No    OB History    Grav Para Term Preterm Abortions TAB SAB Ect Mult Living   2 2 2       2       Review of Systems  Constitutional: Negative for activity change.       All ROS Neg except as noted in HPI  HENT: Negative for nosebleeds and neck pain.   Eyes: Negative for photophobia and discharge.  Respiratory: Negative for cough, shortness of breath and wheezing.   Cardiovascular: Negative for chest pain and palpitations.  Gastrointestinal: Negative for abdominal pain and blood in stool.  Genitourinary: Negative for dysuria, frequency and hematuria.  Musculoskeletal: Negative for back pain and arthralgias.  Skin: Negative.    Neurological: Negative for dizziness, seizures and speech difficulty.  Psychiatric/Behavioral: Negative for hallucinations and confusion.    Allergies  Review of patient's allergies indicates no known allergies.  Home Medications   Current Outpatient Rx  Name Route Sig Dispense Refill  . HYDROCODONE-ACETAMINOPHEN 5-325 MG PO TABS Oral Take 1-2 tablets by mouth every 4 (four) hours as needed. For pain    . IBUPROFEN 200 MG PO CAPS Oral Take 2 capsules by mouth as needed. For pain relief    . MELOXICAM 7.5 MG PO TABS Oral Take 7.5 mg by mouth 2 (two) times daily. 1 po bid with food    . METHOCARBAMOL 500 MG PO TABS Oral Take 1,000 mg by mouth 3 (three) times daily as needed. for spasm      BP 130/89  Pulse 94  Temp 99.2 F (37.3 C) (Oral)  Resp 18  Ht 5\' 9"  (1.753 m)  Wt 265 lb (120.203 kg)  BMI 39.13 kg/m2  SpO2 100%  Physical Exam  Nursing note and vitals reviewed. Constitutional: She is oriented to person, place, and time. She appears well-developed and well-nourished.  Non-toxic appearance.  HENT:  Head: Normocephalic.  Right Ear: Tympanic membrane and external ear normal.  Left Ear: Tympanic membrane and external ear normal.       Patient has a moderate hot potato speech. There is increased redness of the tonsils bilaterally with swelling and exudate. There  is increased redness of the posterior pharynx. The uvula is enlarged, midline.  Eyes: EOM and lids are normal. Pupils are equal, round, and reactive to light.  Neck: Normal range of motion. Neck supple. Carotid bruit is not present.  Cardiovascular: Normal rate, regular rhythm, normal heart sounds, intact distal pulses and normal pulses.   Pulmonary/Chest: Breath sounds normal. No respiratory distress.  Abdominal: Soft. Bowel sounds are normal. There is no tenderness. There is no guarding.  Musculoskeletal: Normal range of motion.  Lymphadenopathy:       Head (right side): No submandibular adenopathy present.        Head (left side): No submandibular adenopathy present.    She has cervical adenopathy.  Neurological: She is alert and oriented to person, place, and time. She has normal strength. No cranial nerve deficit or sensory deficit.  Skin: Skin is warm and dry.  Psychiatric: She has a normal mood and affect. Her speech is normal.    ED Course  Procedures (including critical care time)  Labs Reviewed - No data to display No results found. Pulse oximetry 100% on room air. Within normal limits by my interpretation.  No diagnosis found.    MDM  I have reviewed nursing notes, vital signs, and all appropriate lab and imaging results for this patient. The CT scan is negative for abscess. There is some mild narrowing do to enlargement of the tonsils in the posterior pharynx. The patient is advised to use salt water gargles. Prescription is given for Lortab 7.5 #20, Mobic 7.5 mg, and amoxicillin 500 mg 3 times daily. Patient is advised to wash her hands frequently and to avoid contact with others until this has resolved.       Kathie Dike, [place] 09/27/11 2156

## 2011-09-28 NOTE — ED Provider Notes (Signed)
Medical screening examination/treatment/procedure(s) were performed by non-physician practitioner and as supervising physician I was immediately available for consultation/collaboration.   Nikkia Devoss L Aviella Disbrow, MD 09/28/11 2323 

## 2012-03-01 ENCOUNTER — Emergency Department (HOSPITAL_COMMUNITY)
Admission: EM | Admit: 2012-03-01 | Discharge: 2012-03-01 | Disposition: A | Discharge status: Home / Self Care | Payer: Medicaid Other | Attending: Emergency Medicine | Admitting: Emergency Medicine

## 2012-03-01 ENCOUNTER — Encounter (HOSPITAL_COMMUNITY): Payer: Self-pay | Admitting: *Deleted

## 2012-03-01 DIAGNOSIS — Z8739 Personal history of other diseases of the musculoskeletal system and connective tissue: Secondary | ICD-10-CM | POA: Insufficient documentation

## 2012-03-01 DIAGNOSIS — Z79899 Other long term (current) drug therapy: Secondary | ICD-10-CM | POA: Insufficient documentation

## 2012-03-01 DIAGNOSIS — M549 Dorsalgia, unspecified: Secondary | ICD-10-CM | POA: Insufficient documentation

## 2012-03-01 DIAGNOSIS — R51 Headache: Secondary | ICD-10-CM | POA: Insufficient documentation

## 2012-03-01 DIAGNOSIS — Z8619 Personal history of other infectious and parasitic diseases: Secondary | ICD-10-CM | POA: Insufficient documentation

## 2012-03-01 DIAGNOSIS — J45909 Unspecified asthma, uncomplicated: Secondary | ICD-10-CM | POA: Insufficient documentation

## 2012-03-01 DIAGNOSIS — R1110 Vomiting, unspecified: Secondary | ICD-10-CM | POA: Insufficient documentation

## 2012-03-01 MED ORDER — HYDROMORPHONE HCL PF 1 MG/ML IJ SOLN
1.0000 mg | Freq: Once | INTRAMUSCULAR | Status: AC
  Administered 2012-03-01: 1 mg via INTRAMUSCULAR
  Filled 2012-03-01: qty 1

## 2012-03-01 MED ORDER — HYDROCODONE-ACETAMINOPHEN 5-325 MG PO TABS: 1.0000 | ORAL_TABLET | Freq: Four times a day (QID) | ORAL | Status: DC | PRN

## 2012-03-01 MED ORDER — ONDANSETRON 4 MG PO TBDP
4.0000 mg | ORAL_TABLET | Freq: Once | ORAL | Status: AC
  Administered 2012-03-01: 4 mg via ORAL
  Filled 2012-03-01: qty 1

## 2012-03-01 MED ORDER — PROMETHAZINE HCL 25 MG PO TABS: 25.0000 mg | ORAL_TABLET | Freq: Four times a day (QID) | ORAL | Status: DC | PRN

## 2012-03-01 NOTE — ED Notes (Signed)
Migraine with vomiting x 2 days. Lower back pain also, per pt, which is chronic.

## 2012-03-02 NOTE — ED Provider Notes (Signed)
History     CSN: [id number]  Arrival date & time 03/01/12  1653   First MD Initiated Contact with Patient 03/01/12 2042      Chief Complaint  Patient presents with  . Migraine  . Emesis    (Consider location/radiation/quality/duration/timing/severity/associated sxs/prior treatment) Patient is a 26 y.o. female presenting with migraines and vomiting. The history is provided by the patient (the pt complains of a headache). No language interpreter was used.  Migraine This is a new problem. The current episode started 2 days ago. The problem occurs constantly. The problem has not changed since onset.Associated symptoms include headaches. Pertinent negatives include no chest pain and no abdominal pain. Nothing aggravates the symptoms. Nothing relieves the symptoms. She has tried nothing for the symptoms.  Emesis  Associated symptoms include headaches. Pertinent negatives include no abdominal pain, no cough and no diarrhea.    Past Medical History  Diagnosis Date  . Herpes   . No pertinent past medical history   . Asthma   . Back pain   . Scoliosis     Past Surgical History  Procedure Date  . No past surgeries     No family history on file.  History  Substance Use Topics  . Smoking status: Never Smoker   . Smokeless tobacco: Not on file  . Alcohol Use: No    OB History    Grav Para Term Preterm Abortions TAB SAB Ect Mult Living   2 2 2       2       Review of Systems  Constitutional: Negative for fatigue.  HENT: Negative for congestion, sinus pressure and ear discharge.   Eyes: Negative for discharge.  Respiratory: Negative for cough.   Cardiovascular: Negative for chest pain.  Gastrointestinal: Positive for vomiting. Negative for abdominal pain and diarrhea.  Genitourinary: Negative for frequency and hematuria.  Musculoskeletal: Positive for back pain.  Skin: Negative for rash.  Neurological: Positive for headaches. Negative for seizures.  Hematological:  Negative.   Psychiatric/Behavioral: Negative for hallucinations.    Allergies  Review of patient's allergies indicates no known allergies.  Home Medications   Current Outpatient Rx  Name  Route  Sig  Dispense  Refill  . ETONOGESTREL 68 MG Encinal IMPL   Subcutaneous   Inject 1 each into the skin once.         . IBUPROFEN 200 MG PO CAPS   Oral   Take 1-4 capsules by mouth once as needed. For pain relief         . METHOCARBAMOL 500 MG PO TABS   Oral   Take 1,000 mg by mouth 3 (three) times daily as needed. for spasm         . HYDROCODONE-ACETAMINOPHEN 5-325 MG PO TABS   Oral   Take 1 tablet by mouth every 6 (six) hours as needed for pain.   30 tablet   0   . PROMETHAZINE HCL 25 MG PO TABS   Oral   Take 1 tablet (25 mg total) by mouth every 6 (six) hours as needed for nausea.   15 tablet   0     BP 138/80  Pulse 70  Temp 98 F (36.7 C) (Oral)  Resp 20  Ht 5\' 9"  (1.753 m)  Wt 278 lb (126.1 kg)  BMI 41.05 kg/m2  SpO2 100%  LMP 02/23/2012  Physical Exam  Constitutional: She is oriented to person, place, and time. She appears well-developed.  HENT:  Head: Normocephalic and  atraumatic.  Eyes: Conjunctivae normal and EOM are normal. No scleral icterus.  Neck: Neck supple. No thyromegaly present.  Cardiovascular: Normal rate and regular rhythm.  Exam reveals no gallop and no friction rub.   No murmur heard. Pulmonary/Chest: No stridor. She has no wheezes. She has no rales. She exhibits no tenderness.  Abdominal: She exhibits no distension. There is no tenderness. There is no rebound.  Musculoskeletal: Normal range of motion. She exhibits no edema.       Mild tender lumbar spine  Lymphadenopathy:    She has no cervical adenopathy.  Neurological: She is oriented to person, place, and time. Coordination normal.  Skin: No rash noted. No erythema.  Psychiatric: She has a normal mood and affect. Her behavior is normal.    ED Course  Procedures (including critical  care time)  Labs Reviewed - No data to display No results found.   1. Headache   2. Back pain       MDM          Benny Lennert, MD 03/02/12 1242

## 2012-03-04 ENCOUNTER — Emergency Department (HOSPITAL_COMMUNITY)
Admission: EM | Admit: 2012-03-04 | Discharge: 2012-03-04 | Disposition: A | Discharge status: Home / Self Care | Payer: Medicaid Other | Attending: Emergency Medicine | Admitting: Emergency Medicine

## 2012-03-04 ENCOUNTER — Encounter (HOSPITAL_COMMUNITY): Payer: Self-pay | Admitting: Emergency Medicine

## 2012-03-04 ENCOUNTER — Emergency Department (HOSPITAL_COMMUNITY): Payer: Medicaid Other

## 2012-03-04 DIAGNOSIS — R42 Dizziness and giddiness: Secondary | ICD-10-CM | POA: Insufficient documentation

## 2012-03-04 DIAGNOSIS — IMO0001 Reserved for inherently not codable concepts without codable children: Secondary | ICD-10-CM | POA: Insufficient documentation

## 2012-03-04 DIAGNOSIS — Z8739 Personal history of other diseases of the musculoskeletal system and connective tissue: Secondary | ICD-10-CM | POA: Insufficient documentation

## 2012-03-04 DIAGNOSIS — R5381 Other malaise: Secondary | ICD-10-CM | POA: Insufficient documentation

## 2012-03-04 DIAGNOSIS — J45909 Unspecified asthma, uncomplicated: Secondary | ICD-10-CM | POA: Insufficient documentation

## 2012-03-04 DIAGNOSIS — J029 Acute pharyngitis, unspecified: Secondary | ICD-10-CM | POA: Insufficient documentation

## 2012-03-04 DIAGNOSIS — R059 Cough, unspecified: Secondary | ICD-10-CM | POA: Insufficient documentation

## 2012-03-04 DIAGNOSIS — R509 Fever, unspecified: Secondary | ICD-10-CM | POA: Insufficient documentation

## 2012-03-04 DIAGNOSIS — Z8619 Personal history of other infectious and parasitic diseases: Secondary | ICD-10-CM | POA: Insufficient documentation

## 2012-03-04 DIAGNOSIS — R05 Cough: Secondary | ICD-10-CM

## 2012-03-04 LAB — RAPID STREP SCREEN (MED CTR MEBANE ONLY): Streptococcus, Group A Screen (Direct): NEGATIVE

## 2012-03-04 MED ORDER — GUAIFENESIN-CODEINE 100-10 MG/5ML PO SYRP: ORAL_SOLUTION | ORAL | Status: DC

## 2012-03-04 NOTE — ED Provider Notes (Signed)
History     CSN: [id number]  Arrival date & time 03/04/12  7829   First MD Initiated Contact with Patient 03/04/12 1035      Chief Complaint  Patient presents with  . Influenza    (Consider location/radiation/quality/duration/timing/severity/associated sxs/prior treatment) HPI Comments: NP cough.  States a co-worker is O[place] with strep throat.  Patient is a 26 y.o. female presenting with flu symptoms. The history is provided by the patient. No language interpreter was used.  Influenza This is a new problem. The current episode started yesterday. The problem occurs constantly. Associated symptoms include coughing, fatigue, a fever, headaches, myalgias and a sore throat. Pertinent negatives include no nausea, rash, swollen glands or vomiting. The symptoms are aggravated by swallowing. She has tried nothing for the symptoms. The treatment provided no relief.    Past Medical History  Diagnosis Date  . Herpes   . No pertinent past medical history   . Asthma   . Back pain   . Scoliosis     Past Surgical History  Procedure Date  . No past surgeries     History reviewed. No pertinent family history.  History  Substance Use Topics  . Smoking status: Never Smoker   . Smokeless tobacco: Not on file  . Alcohol Use: No    OB History    Grav Para Term Preterm Abortions TAB SAB Ect Mult Living   2 2 2       2       Review of Systems  Constitutional: Positive for fever and fatigue.  HENT: Positive for sore throat.   Respiratory: Positive for cough.   Gastrointestinal: Negative for nausea and vomiting.  Musculoskeletal: Positive for myalgias.  Skin: Negative for rash.  Neurological: Positive for headaches.  All other systems reviewed and are negative.    Allergies  Review of patient's allergies indicates no known allergies.  Home Medications   Current Outpatient Rx  Name  Route  Sig  Dispense  Refill  . HYDROCODONE-ACETAMINOPHEN 5-325 MG PO TABS   Oral   Take 1  tablet by mouth every 6 (six) hours as needed for pain.   30 tablet   0   . IBUPROFEN 200 MG PO CAPS   Oral   Take 1-4 capsules by mouth once as needed. For pain relief         . METHOCARBAMOL 500 MG PO TABS   Oral   Take 1,000 mg by mouth 3 (three) times daily as needed. for spasm         . ETONOGESTREL 68 MG  IMPL   Subcutaneous   Inject 1 each into the skin once.         . GUAIFENESIN-CODEINE 100-10 MG/5ML PO SYRP      10 mls po q 4-6 hrs prn cough   240 mL   0     BP 135/99  Pulse 72  Temp 98.2 F (36.8 C) (Oral)  Resp 18  Ht 5\' 9"  (1.753 m)  Wt 278 lb (126.1 kg)  BMI 41.05 kg/m2  SpO2 100%  LMP 02/23/2012  Physical Exam  Nursing note and vitals reviewed. Constitutional: She is oriented to person, place, and time. She appears well-developed and well-nourished. No distress.  HENT:  Head: Normocephalic and atraumatic. No trismus in the jaw.  Mouth/Throat: Uvula is midline and mucous membranes are normal. No uvula swelling. Posterior oropharyngeal erythema present. No oropharyngeal exudate, posterior oropharyngeal edema or tonsillar abscesses.  Eyes: EOM are normal.  Neck: Normal range of motion.  Cardiovascular: Normal rate and regular rhythm.   Pulmonary/Chest: Effort normal and breath sounds normal. No accessory muscle usage. Not tachypneic. No respiratory distress. She has no decreased breath sounds. She has no wheezes. She has no rhonchi. She has no rales. She exhibits no tenderness.  Abdominal: Soft. She exhibits no distension. There is no tenderness.  Musculoskeletal: Normal range of motion.  Neurological: She is alert and oriented to person, place, and time.  Skin: Skin is warm and dry.  Psychiatric: She has a normal mood and affect. Judgment normal.    ED Course  Procedures (including critical care time)   Labs Reviewed  RAPID STREP SCREEN   Dg Chest 2 View  03/04/2012  *RADIOLOGY REPORT*  Clinical Data: Cough and fever  CHEST - 2 VIEW   Comparison: Chest radiograph 06/25/2008  Findings: Normal mediastinum and cardiac silhouette.  Normal pulmonary  vasculature.  No evidence of effusion, infiltrate, or pneumothorax.  No acute bony abnormality.  IMPRESSION: No acute cardiopulmonary process.   Original Report Authenticated By: Genevive Bi, M.D.      1. Cough   2. Pharyngitis       MDM          Evalina Field, PA 03/05/12 [phone]

## 2012-03-04 NOTE — ED Notes (Signed)
Pt states cough/sore throat with body aches since yesterday morning. Pt reports daughter sick with same.

## 2012-03-04 NOTE — ED Notes (Signed)
Alert, cough ,  Sore throat,, Has already been examined by PA.

## 2012-03-05 NOTE — ED Provider Notes (Signed)
Medical screening examination/treatment/procedure(s) were performed by non-physician practitioner and as supervising physician I was immediately available for consultation/collaboration.   Flint Melter, MD 03/05/12 [phone]

## 2012-08-13 ENCOUNTER — Encounter: Payer: Self-pay | Admitting: Advanced Practice Midwife

## 2012-08-17 ENCOUNTER — Emergency Department (HOSPITAL_COMMUNITY)
Admission: EM | Admit: 2012-08-17 | Discharge: 2012-08-17 | Disposition: A | Discharge status: Home / Self Care | Payer: Medicaid Other | Attending: Emergency Medicine | Admitting: Emergency Medicine

## 2012-08-17 ENCOUNTER — Encounter (HOSPITAL_COMMUNITY): Payer: Self-pay

## 2012-08-17 DIAGNOSIS — S4980XA Other specified injuries of shoulder and upper arm, unspecified arm, initial encounter: Secondary | ICD-10-CM | POA: Insufficient documentation

## 2012-08-17 DIAGNOSIS — Z8619 Personal history of other infectious and parasitic diseases: Secondary | ICD-10-CM | POA: Insufficient documentation

## 2012-08-17 DIAGNOSIS — Y9389 Activity, other specified: Secondary | ICD-10-CM | POA: Insufficient documentation

## 2012-08-17 DIAGNOSIS — M545 Low back pain: Secondary | ICD-10-CM

## 2012-08-17 DIAGNOSIS — Y92410 Unspecified street and highway as the place of occurrence of the external cause: Secondary | ICD-10-CM | POA: Insufficient documentation

## 2012-08-17 DIAGNOSIS — J45909 Unspecified asthma, uncomplicated: Secondary | ICD-10-CM | POA: Insufficient documentation

## 2012-08-17 DIAGNOSIS — IMO0002 Reserved for concepts with insufficient information to code with codable children: Secondary | ICD-10-CM | POA: Insufficient documentation

## 2012-08-17 DIAGNOSIS — S46909A Unspecified injury of unspecified muscle, fascia and tendon at shoulder and upper arm level, unspecified arm, initial encounter: Secondary | ICD-10-CM | POA: Insufficient documentation

## 2012-08-17 DIAGNOSIS — Z8739 Personal history of other diseases of the musculoskeletal system and connective tissue: Secondary | ICD-10-CM | POA: Insufficient documentation

## 2012-08-17 MED ORDER — CYCLOBENZAPRINE HCL 10 MG PO TABS: 10.0000 mg | ORAL_TABLET | Freq: Two times a day (BID) | ORAL | Status: DC | PRN

## 2012-08-17 MED ORDER — IBUPROFEN 600 MG PO TABS: 600.0000 mg | ORAL_TABLET | Freq: Four times a day (QID) | ORAL | Status: DC | PRN
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
## 2012-08-17 NOTE — ED Provider Notes (Signed)
Medical screening examination/treatment/procedure(s) were performed by non-physician practitioner and as supervising physician I was immediately available for consultation/collaboration.  Jose Corvin R. Alyus Mofield, MD 08/17/12 2337 
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
## 2012-08-26 ENCOUNTER — Encounter: Payer: Self-pay | Admitting: *Deleted

## 2012-08-27 ENCOUNTER — Ambulatory Visit (INDEPENDENT_AMBULATORY_CARE_PROVIDER_SITE_OTHER): Payer: Medicaid Other | Admitting: Obstetrics & Gynecology

## 2012-08-27 ENCOUNTER — Other Ambulatory Visit: Payer: Self-pay | Admitting: Obstetrics & Gynecology

## 2012-08-27 ENCOUNTER — Encounter: Payer: Self-pay | Admitting: Advanced Practice Midwife

## 2012-08-27 VITALS — BP 124/84 | Ht 69.0 in | Wt 278.0 lb

## 2012-08-27 DIAGNOSIS — Z3202 Encounter for pregnancy test, result negative: Secondary | ICD-10-CM

## 2012-08-27 DIAGNOSIS — Z3200 Encounter for pregnancy test, result unknown: Secondary | ICD-10-CM

## 2012-08-27 DIAGNOSIS — Z3046 Encounter for surveillance of implantable subdermal contraceptive: Secondary | ICD-10-CM

## 2012-08-27 DIAGNOSIS — Z309 Encounter for contraceptive management, unspecified: Secondary | ICD-10-CM

## 2012-08-27 MED ORDER — NORGESTIM-ETH ESTRAD TRIPHASIC 0.18/0.215/0.25 MG-25 MCG PO TABS: 1.0000 | ORAL_TABLET | Freq: Every day | ORAL | Status: DC

## 2012-08-27 NOTE — Progress Notes (Signed)
Francine A Poppe 27 y.o. Has had Nexplaon for over a  Year and still bleeds all the time.  Is not sexuallhy active right now.  May consider BTL with endo ablation.  Wants pills now.  Review of Systems   Constitutional: Negative for fever and chills Eyes: Negative for visual disturbances Respiratory: Negative for shortness of breath, dyspnea Cardiovascular: Negative for chest pain or palpitations  Gastrointestinal: Negative for vomiting, diarrhea and constipation Genitourinary: Negative for dysuria and urgency Musculoskeletal: Negative for back pain, joint pain, myalgias  Neurological: Negative for dizziness and headaches  Patient given informed consent for removal of her Implanon, time out was performed.  Signed copy in the chart.  Appropriate time out taken. Implanon site identified.  Area prepped in usual sterile fashon. One cc of 1% lidocaine was used to anesthetize the area at the distal end of the implant. A small stab incision was made right beside the implant on the distal portion.  The implanon rod was grasped using hemostats and removed without difficulty.  There was less than 3 cc blood loss. There were no complications.  A small amount of antibiotic ointment and steri-strips were applied over the small incision.  A pressure bandage was applied to reduce any bruising.  The patient tolerated the procedure well and was given post procedure instructions. She will start Sprintec today, b/u for 3 weeks if sexually active.  BTL papers signed.

## 2012-10-25 ENCOUNTER — Encounter: Payer: Medicaid Other | Admitting: Obstetrics & Gynecology

## 2013-01-27 ENCOUNTER — Encounter: Payer: Self-pay | Admitting: Gastroenterology

## 2013-01-27 ENCOUNTER — Ambulatory Visit: Payer: Medicaid Other | Admitting: Gastroenterology

## 2013-01-27 ENCOUNTER — Telehealth: Payer: Self-pay | Admitting: Gastroenterology

## 2013-01-27 NOTE — Telephone Encounter (Signed)
Mailed letter and PCP is aware °

## 2013-01-27 NOTE — Telephone Encounter (Signed)
Pt was a no show

## 2013-05-07 ENCOUNTER — Ambulatory Visit (INDEPENDENT_AMBULATORY_CARE_PROVIDER_SITE_OTHER): Payer: Medicaid Other | Admitting: Adult Health

## 2013-05-07 ENCOUNTER — Encounter: Payer: Self-pay | Admitting: Adult Health

## 2013-05-07 VITALS — BP 144/80 | Ht 69.0 in | Wt 291.0 lb

## 2013-05-07 DIAGNOSIS — Z3009 Encounter for other general counseling and advice on contraception: Secondary | ICD-10-CM

## 2013-05-07 NOTE — Patient Instructions (Signed)
Endometrial Ablation Endometrial ablation removes the lining of the uterus (endometrium). It is usually a same-day, outpatient treatment. Ablation helps avoid major surgery, such as surgery to remove the cervix and uterus (hysterectomy). After endometrial ablation, you will have little or no menstrual bleeding and may not be able to have children. However, if you are premenopausal, you will need to use a reliable method of birth control following the procedure because of the small chance that pregnancy can occur. There are different reasons to have this procedure, which include:  Heavy periods.  Bleeding that is causing anemia.  Irregular bleeding.  Bleeding fibroids on the lining inside the uterus if they are smaller than 3 centimeters. This procedure should not be done if:  You want children in the future.  You have severe cramps with your menstrual period.  You have precancerous or cancerous cells in your uterus.  You were recently pregnant.  You have gone through menopause.  You have had major surgery on the uterus, such as a cesarean delivery. LET [facility] CARE PROVIDER KNOW ABOUT:  Any allergies you have.  All medicines you are taking, including vitamins, herbs, eye drops, creams, and over-the-counter medicines.  Previous problems you or members of your family have had with the use of anesthetics.  Any blood disorders you have.  Previous surgeries you have had.  Medical conditions you have. RISKS AND COMPLICATIONS  Generally, this is a safe procedure. However, as with any procedure, complications can occur. Possible complications include:  Perforation of the uterus.  Bleeding.  Infection of the uterus, bladder, or vagina.  Injury to surrounding organs.  An air bubble to the lung (air embolus).  Pregnancy following the procedure.  Failure of the procedure to help the problem, requiring hysterectomy.  Decreased ability to diagnose cancer in the lining of  the uterus. BEFORE THE PROCEDURE  The lining of the uterus must be tested to make sure there is no pre-cancerous or cancer cells present.  An ultrasound may be performed to look at the size of the uterus and to check for abnormalities.  Medicines may be given to thin the lining of the uterus. PROCEDURE  During the procedure, your health care provider will use a tool called a resectoscope to help see inside your uterus. There are different ways to remove the lining of your uterus.   Radiofrequency  This method uses a radiofrequency-alternating electric current to remove the lining of the uterus.  Cryotherapy This method uses extreme cold to freeze the lining of the uterus.  Heated-Free Liquid  This method uses heated salt (saline) solution to remove the lining of the uterus.  Microwave This method uses high-energy microwaves to heat up the lining of the uterus to remove it.  Thermal balloon  This method involves inserting a catheter with a balloon tip into the uterus. The balloon tip is filled with heated fluid to remove the lining of the uterus. AFTER THE PROCEDURE  After your procedure, do not have sexual intercourse or insert anything into your vagina until permitted by your health care provider. After the procedure, you may experience:  Cramps.  Vaginal discharge.  Frequent urination. Document Released: 12/31/2003 Document Revised: 10/23/2012 Document Reviewed: 07/24/2012 [facility] Patient Information 2014 [place]. Laparoscopic Tubal Ligation Laparoscopic tubal ligation is a procedure that closes the fallopian tubes at a time other than right after childbirth. By closing the fallopian tubes, the eggs that are released from the ovaries cannot enter the uterus and sperm cannot reach the  egg. Tubal ligation is also known as getting your "tubes tied." Tubal ligation is done so you will not be able to get pregnant or have a baby.  Although this procedure may be reversed, it  should be considered permanent and irreversible. If you want to have future pregnancies, you should not have this procedure.  LET YOUR CAREGIVER KNOW ABOUT:  Allergies to food or medicine.  Medicines taken, including vitamins, herbs, eyedrops, over-the-counter medicines, and creams.  Use of steroids (by mouth or creams).  Previous problems with numbing medicines.  History of bleeding problems or blood clots.  Any recent colds or infections.  Previous surgery.  Other health problems, including diabetes and kidney problems.  Possibility of pregnancy, if this applies.  Any past pregnancies. RISKS AND COMPLICATIONS   Infection.  Bleeding.  Injury to surrounding organs.  Anesthetic side effects.  Failure of the procedure.  Ectopic pregnancy.  Future regret about having the procedure done. BEFORE THE PROCEDURE  Do not take aspirin or blood thinners a week before the procedure or as directed. This can cause bleeding.  Do not eat or drink anything 6 to 8 hours before the procedure. PROCEDURE   You may be given a medicine to help you relax (sedative) before the procedure. You will be given a medicine to make you sleep (general anesthetic) during the procedure.  A tube will be put down your throat to help your breath while under general anesthesia.  Two small cuts (incisions) are made in the lower abdominal area and near the belly button.  Your abdominal area will be inflated with a safe gas (carbon dioxide). This helps give the surgeon room to operate, visualize, and helps the surgeon avoid other organs.  A thin, lighted tube (laparoscope) with a camera attached is inserted into your abdomen through one of the incisions near the belly button. Other small instruments are also inserted through the other abdominal incision.  The fallopian tubes are located and are either blocked with a ring, clip, or are burned (cauterized).  After the fallopian tubes are blocked, the gas  is released from the abdomen.  The incisions will be closed with stitches (sutures), and a bandage may be placed over the incisions. AFTER THE PROCEDURE   You will rest in a recovery room for 1 4 hours until you are stable and doing well.  You will also have some mild abdominal discomfort for 3 7 days. You will be given pain medicine to ease any discomfort.  As long as there are no problems, you may be allowed to go home. Someone will need to drive you home and be with you for at least 24 hours once home.  You may have some mild discomfort in the throat. This is from the tube placed in your throat while you were sleeping.  You may experience discomfort in the shoulder area from some trapped air between the liver and diaphragm. This sensation is normal and will slowly go away on its own. Document Released: 05/29/2000 Document Revised: 08/22/2011 Document Reviewed: 06/03/2011 [facility] Patient Information 2014 [place]. Return in 3 weeks for pre op

## 2013-05-07 NOTE — Progress Notes (Signed)
Subjective:     Patient ID: [name], female   DOB: 04-25-1985, 28 y.o.   MRN: [id number]  HPI Glorious is a 28 year old black female in to discuss getting her tubes tied.She has 2 girls, 1 nine and 68 two years old and does not want any more, has been on the pill, but stopped.She also wants ablation to prevent periods.  Review of Systems See HPI Reviewed past medical,surgical, social and family history. Reviewed medications and allergies.     Objective:   Physical Exam BP 144/80  Ht 5\' 9"  (1.753 m)  Wt 291 lb (131.997 kg)  BMI 42.95 kg/m2  LMP 05/03/2013   Discussed tubal and ablation and pt wishes to proceed  Assessment:     Desires elective sterilization and ablation    Plan:     Sign permit for tubal ligation Review handouts on tubal ligation  And endometrial ablation Return in 3 weeks for pre op with Dr Elonda Husky and needs pap

## 2013-05-28 ENCOUNTER — Encounter: Payer: Medicaid Other | Admitting: Obstetrics & Gynecology

## 2013-06-30 ENCOUNTER — Emergency Department (HOSPITAL_COMMUNITY): Payer: Medicaid Other

## 2013-06-30 ENCOUNTER — Encounter (HOSPITAL_COMMUNITY): Payer: Self-pay | Admitting: Emergency Medicine

## 2013-06-30 ENCOUNTER — Emergency Department (HOSPITAL_COMMUNITY)
Admission: EM | Admit: 2013-06-30 | Discharge: 2013-06-30 | Disposition: A | Discharge status: Home / Self Care | Payer: Medicaid Other | Attending: Emergency Medicine | Admitting: Emergency Medicine

## 2013-06-30 DIAGNOSIS — J45909 Unspecified asthma, uncomplicated: Secondary | ICD-10-CM | POA: Insufficient documentation

## 2013-06-30 DIAGNOSIS — R5381 Other malaise: Secondary | ICD-10-CM | POA: Insufficient documentation

## 2013-06-30 DIAGNOSIS — G8929 Other chronic pain: Secondary | ICD-10-CM | POA: Insufficient documentation

## 2013-06-30 DIAGNOSIS — R109 Unspecified abdominal pain: Secondary | ICD-10-CM | POA: Insufficient documentation

## 2013-06-30 DIAGNOSIS — Z79899 Other long term (current) drug therapy: Secondary | ICD-10-CM | POA: Insufficient documentation

## 2013-06-30 DIAGNOSIS — O9989 Other specified diseases and conditions complicating pregnancy, childbirth and the puerperium: Secondary | ICD-10-CM | POA: Insufficient documentation

## 2013-06-30 DIAGNOSIS — R110 Nausea: Secondary | ICD-10-CM | POA: Insufficient documentation

## 2013-06-30 DIAGNOSIS — Z8739 Personal history of other diseases of the musculoskeletal system and connective tissue: Secondary | ICD-10-CM | POA: Insufficient documentation

## 2013-06-30 DIAGNOSIS — N949 Unspecified condition associated with female genital organs and menstrual cycle: Secondary | ICD-10-CM | POA: Insufficient documentation

## 2013-06-30 DIAGNOSIS — Z8619 Personal history of other infectious and parasitic diseases: Secondary | ICD-10-CM | POA: Insufficient documentation

## 2013-06-30 DIAGNOSIS — R5383 Other fatigue: Secondary | ICD-10-CM

## 2013-06-30 DIAGNOSIS — R102 Pelvic and perineal pain: Secondary | ICD-10-CM

## 2013-06-30 DIAGNOSIS — Z3490 Encounter for supervision of normal pregnancy, unspecified, unspecified trimester: Secondary | ICD-10-CM

## 2013-06-30 LAB — URINALYSIS, ROUTINE W REFLEX MICROSCOPIC
Bilirubin Urine: NEGATIVE
GLUCOSE, UA: NEGATIVE mg/dL
KETONES UR: NEGATIVE mg/dL
LEUKOCYTES UA: NEGATIVE
NITRITE: NEGATIVE
PH: 8 (ref 5.0–8.0)
PROTEIN: NEGATIVE mg/dL
Specific Gravity, Urine: 1.015 (ref 1.005–1.030)
Urobilinogen, UA: 0.2 mg/dL (ref 0.0–1.0)

## 2013-06-30 LAB — URINE MICROSCOPIC-ADD ON

## 2013-06-30 LAB — PREGNANCY, URINE: PREG TEST UR: POSITIVE — AB

## 2013-06-30 MED ORDER — PRENATAL COMPLETE 14-0.4 MG PO TABS: 1.0000 | ORAL_TABLET | Freq: Every day | ORAL | Status: DC

## 2013-06-30 MED ORDER — ONDANSETRON HCL 8 MG PO TABS: 8.0000 mg | ORAL_TABLET | Freq: Four times a day (QID) | ORAL | Status: DC

## 2013-06-30 NOTE — Discharge Instructions (Signed)
Pregnancy, First Trimester The first trimester is the first 3 months your baby is growing inside you. It is important to follow your doctor's instructions. HOME CARE   Do not smoke.  Do not drink alcohol.  Only take medicine as told by your doctor.  Exercise.  Eat healthy foods. Eat regular, well-balanced meals.  You can have sex (intercourse) if there are no other problems with the pregnancy.  Things that help with morning sickness:  Eat soda crackers before getting up in the morning.  Eat 4 to 5 small meals rather than 3 large meals.  Drink liquids between meals, not during meals.  Go to all appointments as told.  Take all vitamins or supplements as told by your doctor. GET HELP RIGHT AWAY IF:   You develop a fever.  You have a bad smelling fluid that is leaking from your vagina.  There is bleeding from the vagina.  You develop severe belly (abdominal) or back pain.  You throw up (vomit) blood. It may look like coffee grounds.  You lose more than 2 pounds in a week.  You gain 5 pounds or more in a week.  You gain more than 2 pounds in a week and you see puffiness (swelling) in your feet, ankles, or legs.  You have severe dizziness or pass out (faint).  You are around people who have [place] measles, chickenpox, or fifth disease.  You have a headache, watery poop (diarrhea), pain with peeing (urinating), or cannot breath right. Document Released: 08/09/2007 Document Revised: 05/15/2011 Document Reviewed: 08/09/2007 [facility] Patient Information 2014 [place], [place].

## 2013-06-30 NOTE — ED Notes (Signed)
Pt c/o lower abd pain and nausea x 3 days. Small amount of vaginal bleeding over the weekend. lmp-early march.

## 2013-06-30 NOTE — ED Notes (Signed)
Pt seen and ready for d/c when seen by me.  Alert, Nad

## 2013-06-30 NOTE — ED Provider Notes (Signed)
CSN: [id number]     Arrival date & time 06/30/13  1127 History  This chart was scribed for non-physician practitioner Evalee Jefferson, PA-C working with Maudry Diego, MD by Zettie Pho, ED Scribe. This patient was seen in room APFT23/APFT23 and the patient's care was started at 4:31 PM.   Chief Complaint  Patient presents with  . Abdominal Pain   The history is provided by the patient. No language interpreter was used.   HPI Comments: Christina Randall is a 28 y.o. Female with a history of Herpes who presents to the Emergency Department complaining of an intermittent, sharp pain to the suprapubic region of the abdomen with associated nausea and fatigue onset 3 days ago that she states has been progressively worsening. Patient states that the pain will usually last about an hour, then resolve on its own. She reports some associated vaginal bleeding a few days ago, which she states has since resolved. She denies taking any medication at home to treat her symptoms. Patient denies dysuria, vaginal discharge, fever. She reports that she has two children that were both born vaginally without complication. Patient reports that she does not plan to follow through with her current pregnancy. She denies any allergies to medications. Patient reports that she takes hydrocodone daily for her chronic back pain, but she states that she is unsure where this medicine as "she changes her pocketbook daily". Patient has no other pertinent medical history.   PCP- Dr. Leslie Andrea Gynecologist- [facility]  Past Medical History  Diagnosis Date  . Herpes   . No pertinent past medical history   . Asthma   . Back pain   . Scoliosis   . Frequent headaches   . Herniated disc    Past Surgical History  Procedure Laterality Date  . No past surgeries     Family History  Problem Relation Age of Onset  . Hypertension Father   . Diabetes Maternal Grandmother   . Cancer Maternal Grandmother     pancreatic  . Cancer  Maternal Grandfather     lungs   History  Substance Use Topics  . Smoking status: Never Smoker   . Smokeless tobacco: Never Used  . Alcohol Use: No   OB History   Grav Para Term Preterm Abortions TAB SAB Ect Mult Living   2 2 2       2      Review of Systems  Constitutional: Positive for fatigue. Negative for fever.  HENT: Negative for congestion and sore throat.   Eyes: Negative.   Respiratory: Negative for chest tightness and shortness of breath.   Cardiovascular: Negative for chest pain.  Gastrointestinal: Positive for nausea and abdominal pain.  Genitourinary: Positive for vaginal bleeding (resolved). Negative for dysuria and vaginal discharge.  Musculoskeletal: Negative for arthralgias, joint swelling and neck pain.  Skin: Negative.  Negative for rash and wound.  Neurological: Negative for dizziness, weakness, light-headedness, numbness and headaches.  Psychiatric/Behavioral: Negative.    Allergies  Review of patient's allergies indicates no known allergies.  Home Medications   Prior to Admission medications   Medication Sig Start Date End Date Taking? Authorizing Provider  HYDROcodone-acetaminophen (NORCO/VICODIN) 5-325 MG per tablet Take 1 tablet by mouth every 6 (six) hours as needed for pain. 03/01/12   Maudry Diego, MD  Ibuprofen (ADVIL MIGRAINE) 200 MG CAPS Take 1-4 capsules by mouth once as needed. For pain relief    Historical Provider, MD  ibuprofen (ADVIL,MOTRIN) 600 MG tablet Take 1  tablet (600 mg total) by mouth every 6 (six) hours as needed for pain. 08/17/12   Quartzsite, NP  Norgestimate-Ethinyl Estradiol Triphasic 0.18/0.215/0.25 MG-25 MCG tab Take 1 tablet by mouth daily. 08/27/12   Florian Buff, MD   Triage Vitals: BP 152/89  Pulse 81  Temp(Src) 97.9 F (36.6 C) (Oral)  Resp 18  Ht 5\' 9"  (1.753 m)  Wt 290 lb (131.543 kg)  BMI 42.81 kg/m2  SpO2 100%  LMP 05/04/2013  Physical Exam  Nursing note and vitals reviewed. Constitutional: She  appears well-developed and well-nourished.  HENT:  Head: Normocephalic and atraumatic.  Eyes: Conjunctivae are normal.  Neck: Normal range of motion.  Cardiovascular: Normal rate, regular rhythm, normal heart sounds and intact distal pulses.   Pulmonary/Chest: Effort normal and breath sounds normal. She has no wheezes.  Abdominal: Soft. Bowel sounds are normal. There is no tenderness. There is no rebound and no guarding.  Genitourinary:  deferred  Musculoskeletal: Normal range of motion.  Neurological: She is alert.  Skin: Skin is warm and dry.  Psychiatric: She has a normal mood and affect.    ED Course  Procedures (including critical care time)  DIAGNOSTIC STUDIES: Oxygen Saturation is 100% on room air, normal by my interpretation.    COORDINATION OF CARE: 12:00 PM- Ordered UA and urine pregnancy, which was positive.   1:26 PM- Ordered transvaginal and complete ultrasound.   4:39 PM- Discussed that ultrasound results confirm an intra-uterine pregnancy, about 8 weeks and 2 days gestational age. Patient reports that she does not plan to follow through with her current pregnancy. Discussed that results also indicate a right-sided ovarian cyst and a left-sided ovarian lesion. Advised patient to follow up with her gynecologist as soon as possible. Will discharge patient with anti-nausea medication to manage symptoms. Discussed treatment plan with patient at bedside and patient verbalized agreement.     Labs Review Labs Reviewed  URINALYSIS, ROUTINE W REFLEX MICROSCOPIC - Abnormal; Notable for the following:    Hgb urine dipstick TRACE (*)    All other components within normal limits  PREGNANCY, URINE - Abnormal; Notable for the following:    Preg Test, Ur POSITIVE (*)    All other components within normal limits  URINE MICROSCOPIC-ADD ON - Abnormal; Notable for the following:    Squamous Epithelial / LPF MANY (*)    All other components within normal limits    Imaging  Review US Ob Transvaginal  06/30/2013   CLINICAL DATA:  Abdominal pain  EXAM: OBSTETRIC <14 WK [place] AND TRANSVAGINAL OB US  TECHNIQUE: Both transabdominal and transvaginal ultrasound examinations were performed for complete evaluation of the gestation as well as the maternal uterus, adnexal regions, and pelvic cul-de-sac. Transvaginal technique was performed to assess early pregnancy.  COMPARISON:  None.  FINDINGS: Intrauterine gestational sac: Visualized/normal in shape.  Yolk sac:  Present.  Embryo:  Present.  Cardiac Activity: Present.  Heart Rate:  171 bpm  CRL:   18.1  mm   8 w 2d                  [place] EDC: 02/07/2014  Maternal uterus/adnexae: There is a 4 x 2.3 x 4.2 cm anechoic right ovarian mass most consistent with a simple cyst. There is a small non specific echogenic area in the left ovary measuring 7.5 x 5.6 x 6.5 mm which may reflect a small hemorrhagic cyst or endometrioma versus dermoid.  IMPRESSION: 1. Single live intrauterine pregnancy dating 36  weeks 2 days with an ultrasound EDC of 02/07/2014. 2. Simple right ovarian cyst. 3. Small echogenic 7.5 mm left ovarian lesion which is nonspecific in appearance. This may reflect a small hemorrhagic cyst or endometrioma versus dermoid. Attention on follow-up examination is recommended.   Electronically Signed   By: Kathreen Devoid   On: 06/30/2013 15:40     EKG Interpretation None      MDM   Final diagnoses:  Pregnancy  Pelvic pain   Pt with new intrauterine pregnancy with pain, suggested source possibly being the ovarian cyst seen on [place].  Discussed case with Dr. Glo Herring who will f/u with pt within 1 week in office.  Advised sx treatment at this time.  She was prescribed zofran and prenatal vitamin.  Encouraged tylenol prn pain, could use her hydrocodone if needed for worsened pain.  The patient appears reasonably screened and/or stabilized for discharge and I doubt any other medical condition or other [facility] requiring further screening, evaluation,  or treatment in the ED at this time prior to discharge.   I personally performed the services described in this documentation, which was scribed in my presence. The recorded information has been reviewed and is accurate.     Evalee Jefferson, PA-C 07/01/13 1207

## 2013-07-03 NOTE — ED Provider Notes (Signed)
Medical screening examination/treatment/procedure(s) were performed by non-physician practitioner and as supervising physician I was immediately available for consultation/collaboration.   EKG Interpretation None        Maudry Diego, MD 07/03/13 1002

## 2013-09-02 ENCOUNTER — Ambulatory Visit (INDEPENDENT_AMBULATORY_CARE_PROVIDER_SITE_OTHER): Payer: Medicaid Other | Admitting: Orthopedic Surgery

## 2013-09-02 ENCOUNTER — Encounter: Payer: Self-pay | Admitting: Orthopedic Surgery

## 2013-09-02 ENCOUNTER — Other Ambulatory Visit: Payer: Self-pay | Admitting: Obstetrics & Gynecology

## 2013-09-02 ENCOUNTER — Inpatient Hospital Stay
Admission: RE | Admit: 2013-09-02 | Discharge: 2013-09-02 | Disposition: A | Discharge status: Home / Self Care | Payer: Self-pay | Source: Ambulatory Visit | Attending: Orthopedic Surgery | Admitting: Orthopedic Surgery

## 2013-09-02 ENCOUNTER — Ambulatory Visit (INDEPENDENT_AMBULATORY_CARE_PROVIDER_SITE_OTHER): Payer: Medicaid Other

## 2013-09-02 VITALS — BP 139/88 | Ht 69.0 in | Wt 285.0 lb

## 2013-09-02 DIAGNOSIS — M5430 Sciatica, unspecified side: Secondary | ICD-10-CM

## 2013-09-02 DIAGNOSIS — M5442 Lumbago with sciatica, left side: Secondary | ICD-10-CM

## 2013-09-02 NOTE — Progress Notes (Signed)
Subjective:     Patient ID: Christina Randall, female   DOB: 06-02-1985, 28 y.o.   MRN: [id number] Chief Complaint  Patient presents with  . Back Pain    lower back pain, referred by Gosrani    Back Pain   28 year old female had a history of lumbar disc problems back in 2010. She had MRI was referred for epidural steroid injections and did get relief however she is concerned because she gained a significant amount of weight and does not want to gain the weight. She has radicular symptoms in her left leg and lower back pain which is exacerbated by standing and also lying flat on her back. She does not have getting locking giving way of the left leg but intermittent radicular pain.   Review of Systems  Musculoskeletal: Positive for back pain.  Neurological: Positive for dizziness.       Headache       Objective:   Physical Exam Vital signs: BP 139/88  Ht 5\' 9"  (1.753 m)  Wt 285 lb (129.275 kg)  BMI 42.07 kg/m2   General the patient is well-developed and well-nourished grooming and hygiene are normal Oriented x3 Mood and affect normal Ambulation no significant ambulatory problems  She does have lumbar spine pain and tenderness especially in the lower back has increased lumbar lordosis. There is no increased muscle tension. Skin warm dry and intact without peri-patches or skin lesions over the lumbar spine   Inspection of the lower extremities Full range of motion All joints are stable Motor exam is normal Skin clean dry and intact  Cardiovascular exam is normal Sensory exam normal lateral legs with normal reflexes which are 2+ and equal  She has no tension signs although she has pain with straight leg raise at 30 bilaterally and the pain is in the lower back     Assessment:     Disc disease with previous herniation, previously assessed and had epidural steroids but no therapy. She does not want to have surgery. She's not 1 have steroid injections.    Plan:     At  this point I told her she has basically 2 options repeat injections or therapy since she does not want have surgery  We will give her an IM shot of cortisone in the left hip and start her on a physical therapy program and refer her back to you for further ongoing pain management. If her therapy proves unsuccessful we would recommend that she get a repeat MRI and referral to appropriate neurosurgery

## 2013-09-02 NOTE — Patient Instructions (Signed)
You have received a steroid injection today Call to arrange physical therapy Follow up with your primary care doctor

## 2013-09-17 ENCOUNTER — Ambulatory Visit (HOSPITAL_COMMUNITY): Payer: Medicaid Other | Admitting: Physical Therapy

## 2013-11-05 ENCOUNTER — Ambulatory Visit (HOSPITAL_COMMUNITY)
Admission: RE | Admit: 2013-11-05 | Discharge: 2013-11-05 | Disposition: A | Discharge status: Home / Self Care | Payer: Medicaid Other | Source: Ambulatory Visit | Attending: Family Medicine | Admitting: Family Medicine

## 2013-11-05 DIAGNOSIS — G8929 Other chronic pain: Secondary | ICD-10-CM | POA: Insufficient documentation

## 2013-11-05 DIAGNOSIS — M5126 Other intervertebral disc displacement, lumbar region: Secondary | ICD-10-CM | POA: Diagnosis present

## 2013-11-05 DIAGNOSIS — M545 Low back pain, unspecified: Secondary | ICD-10-CM | POA: Insufficient documentation

## 2013-11-05 DIAGNOSIS — IMO0001 Reserved for inherently not codable concepts without codable children: Secondary | ICD-10-CM | POA: Diagnosis not present

## 2013-11-05 DIAGNOSIS — M549 Dorsalgia, unspecified: Secondary | ICD-10-CM | POA: Diagnosis present

## 2013-11-05 NOTE — Evaluation (Signed)
Physical Therapy Evaluation  Patient Details  Name: Christina Randall MRN: [id number] Date of Birth: June 25, 1985  Today's Date: 11/05/2013 Time: 1300-1340 PT Time Calculation (min): 40 min Charge:  Evaluation              Visit#:   of 1  Re-eval:   Assessment Diagnosis: lbp   Authorization:   Medicaid     Past Medical History:  Past Medical History  Diagnosis Date  . Herpes   . No pertinent past medical history   . Asthma   . Back pain   . Scoliosis   . Frequent headaches   . Herniated disc    Past Surgical History:  Past Surgical History  Procedure Laterality Date  . No past surgeries      Subjective Symptoms/Limitations Symptoms: Christina Randall has had back pain since 2010.  She was on steroid shots every six months but the MD that was giving her the shots have quite giving them to her.  Pain is on L side and runs down her leg to the little toe.   How long can you sit comfortably?: five minutes  How long can you stand comfortably?: five minutes  How long can you walk comfortably?: Pt states that she does not walk due to having runners itch.   Pain Assessment Currently in Pain?: Yes Pain Score: 6  Pain Location: Back Pain Orientation: Left Pain Type: Chronic pain Pain Onset: More than a month ago Pain Frequency: Constant   Assessment RLE Strength Right Hip Flexion: 4/5 Right Hip Extension: 4/5 Right Hip ABduction: 5/5 Right Knee Flexion: 5/5 Right Knee Extension: 5/5 LLE Strength Left Hip Flexion: 3/5 Left Hip Extension: 3/5 Left Hip ABduction: 5/5 Left Knee Flexion: 5/5 Left Knee Extension: 5/5 Left Ankle Dorsiflexion: 5/5 Lumbar AROM Lumbar Flexion: wfl Lumbar Extension: wfl Lumbar - Right Side Bend: wfl Lumbar - Left Side Bend: wfl Lumbar - Right Rotation: wfl Lumbar - Left Rotation: wfl  Exercise/Treatments Mobility/Balance  Posture/Postural Control Posture/Postural Control: Postural limitations Postural Limitations: protruding abdominal mm    Stretches Standing Extension: 5 reps Prone on Elbows Stretch: 1 rep;60 seconds Press Ups: 5 reps   Supine Ab Set: 5 reps Bridge: 5 reps Straight Leg Raise: 5 reps Prone  Straight Leg Raise: 5 reps    Physical Therapy Assessment and Plan PT Assessment and Plan Clinical Impression Statement: Pt is a 28 yo female with chronic low back pain.  Christina Randall exam demonstrates poor body mechanics as well as decreased core and LE strength.  Pt was educated in Economist and strengthening exercises.  PT Plan: one time visit secondary to insurance.     Goals Home Exercise Program Pt/caregiver will Perform Home Exercise Program: For increased strengthening  Problem List Patient Active Problem List   Diagnosis Date Noted  . H N P-LUMBAR 01/05/2009  . DEGENERATIVE JOINT DISEASE, KNEE 11/26/2008  . DERANGEMENT MENISCUS 11/26/2008  . 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 BACK PAIN 11/26/2008    PT Plan of Care PT Home Exercise Plan: given  GP    RUSSELL,CINDY 11/05/2013, 1:51 PM  Physician Documentation Your signature is required to indicate approval of the treatment plan as stated above.  Please sign and either send electronically or make a copy of this report for your files and return this physician signed original.   Please mark one 1.__approve of plan  2. ___approve of plan with the following conditions.   ______________________________  _____________________ Physician Signature                                                                                                             Date SBNR

## 2013-12-04 ENCOUNTER — Encounter (HOSPITAL_COMMUNITY): Payer: Self-pay | Admitting: Emergency Medicine

## 2013-12-04 ENCOUNTER — Emergency Department (HOSPITAL_COMMUNITY)
Admission: EM | Admit: 2013-12-04 | Discharge: 2013-12-04 | Disposition: A | Discharge status: Home / Self Care | Payer: Medicaid Other | Attending: Emergency Medicine | Admitting: Emergency Medicine

## 2013-12-04 DIAGNOSIS — Z79899 Other long term (current) drug therapy: Secondary | ICD-10-CM | POA: Insufficient documentation

## 2013-12-04 DIAGNOSIS — Z8619 Personal history of other infectious and parasitic diseases: Secondary | ICD-10-CM | POA: Diagnosis not present

## 2013-12-04 DIAGNOSIS — M4120 Other idiopathic scoliosis, site unspecified: Secondary | ICD-10-CM | POA: Diagnosis not present

## 2013-12-04 DIAGNOSIS — N3000 Acute cystitis without hematuria: Secondary | ICD-10-CM | POA: Diagnosis not present

## 2013-12-04 DIAGNOSIS — Z3202 Encounter for pregnancy test, result negative: Secondary | ICD-10-CM | POA: Diagnosis not present

## 2013-12-04 DIAGNOSIS — R109 Unspecified abdominal pain: Secondary | ICD-10-CM | POA: Diagnosis present

## 2013-12-04 DIAGNOSIS — J45909 Unspecified asthma, uncomplicated: Secondary | ICD-10-CM | POA: Diagnosis not present

## 2013-12-04 LAB — PREGNANCY, URINE: PREG TEST UR: NEGATIVE

## 2013-12-04 LAB — URINALYSIS, ROUTINE W REFLEX MICROSCOPIC
BILIRUBIN URINE: NEGATIVE
Glucose, UA: NEGATIVE mg/dL
KETONES UR: NEGATIVE mg/dL
NITRITE: NEGATIVE
PH: 6 (ref 5.0–8.0)
Protein, ur: 100 mg/dL — AB
SPECIFIC GRAVITY, URINE: 1.025 (ref 1.005–1.030)
Urobilinogen, UA: 0.2 mg/dL (ref 0.0–1.0)

## 2013-12-04 LAB — URINE MICROSCOPIC-ADD ON

## 2013-12-04 MED ORDER — HYDROCODONE-ACETAMINOPHEN 5-325 MG PO TABS: 2.0000 | ORAL_TABLET | ORAL | Status: DC | PRN

## 2013-12-04 MED ORDER — CIPROFLOXACIN HCL 500 MG PO TABS: 500.0000 mg | ORAL_TABLET | Freq: Two times a day (BID) | ORAL | Status: DC

## 2013-12-04 MED ORDER — IBUPROFEN 800 MG PO TABS
800.0000 mg | ORAL_TABLET | Freq: Once | ORAL | Status: AC
  Administered 2013-12-04: 800 mg via ORAL
  Filled 2013-12-04: qty 1

## 2013-12-04 NOTE — ED Notes (Signed)
Pt c/o rt flank pain and urinary frequency with chills.

## 2013-12-04 NOTE — ED Provider Notes (Signed)
CSN: [id number]     Arrival date & time 12/04/13  0440 History   First MD Initiated Contact with Patient 12/04/13 0459     Chief Complaint  Patient presents with  . Flank Pain     (Consider location/radiation/quality/duration/timing/severity/associated sxs/prior Treatment) HPI Comments: Patient is a 28 year old female with history of asthma. She presents with complaints of urinary frequency and dysuria for the past several days. She woke up this evening with pain in her right flank. She denies any fevers or chills. She denies any bowel complaints.  Patient is a 28 y.o. female presenting with flank pain. The history is provided by the patient.  Flank Pain This is a new problem. The current episode started yesterday. The problem occurs constantly. The problem has been gradually worsening. Pertinent negatives include no chest pain and no abdominal pain. Nothing aggravates the symptoms. Nothing relieves the symptoms. She has tried nothing for the symptoms.    Past Medical History  Diagnosis Date  . Herpes   . No pertinent past medical history   . Asthma   . Back pain   . Scoliosis   . Frequent headaches   . Herniated disc    Past Surgical History  Procedure Laterality Date  . No past surgeries     Family History  Problem Relation Age of Onset  . Hypertension Father   . Diabetes Maternal Grandmother   . Cancer Maternal Grandmother     pancreatic  . Cancer Maternal Grandfather     lungs   History  Substance Use Topics  . Smoking status: Never Smoker   . Smokeless tobacco: Never Used  . Alcohol Use: No   OB History   Grav Para Term Preterm Abortions TAB SAB Ect Mult Living   2 2 2       2      Review of Systems  Cardiovascular: Negative for chest pain.  Gastrointestinal: Negative for abdominal pain.  Genitourinary: Positive for flank pain.  All other systems reviewed and are negative.     Allergies  Review of patient's allergies indicates no known  allergies.  Home Medications   Prior to Admission medications   Medication Sig Start Date End Date Taking? Authorizing Provider  HYDROcodone-acetaminophen (NORCO/VICODIN) 5-325 MG per tablet Take 1 tablet by mouth every 6 (six) hours as needed for moderate pain.   Yes Historical Provider, MD  ondansetron (ZOFRAN) 8 MG tablet Take 1 tablet (8 mg total) by mouth every 6 (six) hours. 06/30/13   Evalee Jefferson, PA-C  ORTHO TRI-CYCLEN LO 0.18/0.215/0.25 MG-25 MCG tab TAKE 1 TABLET BY MOUTH DAILY 09/02/13   Florian Buff, MD  Prenatal Vit-Fe Fumarate-FA (PRENATAL COMPLETE) 14-0.4 MG TABS Take 1 tablet by mouth daily. 06/30/13   Evalee Jefferson, PA-C   BP 149/95  Pulse 90  Temp(Src) 98.2 F (36.8 C) (Oral)  Resp 16  Ht 5\' 9"  (1.753 m)  Wt 290 lb (131.543 kg)  BMI 42.81 kg/m2  SpO2 100%  LMP 10/20/2013 Physical Exam  Nursing note and vitals reviewed. Constitutional: She is oriented to person, place, and time. She appears well-developed and well-nourished. No distress.  HENT:  Head: Normocephalic and atraumatic.  Neck: Normal range of motion. Neck supple.  Cardiovascular: Normal rate and regular rhythm.  Exam reveals no gallop and no friction rub.   No murmur heard. Pulmonary/Chest: Effort normal and breath sounds normal. No respiratory distress. She has no wheezes.  Abdominal: Soft. Bowel sounds are normal. She exhibits no distension and no  mass. There is tenderness. There is no rebound and no guarding.  There is mild right-sided CVA tenderness.  Musculoskeletal: Normal range of motion.  Neurological: She is alert and oriented to person, place, and time.  Skin: Skin is warm and dry. She is not diaphoretic.    ED Course  Procedures (including critical care time) Labs Review Labs Reviewed  PREGNANCY, URINE  URINALYSIS, ROUTINE W REFLEX MICROSCOPIC    Imaging Review No results found.   EKG Interpretation None      MDM   Final diagnoses:  None    UA, history, and exam are  consistent with a urinary tract infection. We'll treat with Cipro, pain meds, and when necessary return.    Veryl Speak, MD 12/04/13 [phone]

## 2013-12-04 NOTE — Discharge Instructions (Signed)
Cipro as prescribed.  Hydrocodone as prescribed as needed for pain.  Return to the emergency department if you develop worsening pain, high fever, vomiting with an inability to keep your medications down, or other new and concerning symptoms.   Urinary Tract Infection Urinary tract infections (UTIs) can develop anywhere along your urinary tract. Your urinary tract is your body's drainage system for removing wastes and extra water. Your urinary tract includes two kidneys, two ureters, a bladder, and a urethra. Your kidneys are a pair of bean-shaped organs. Each kidney is about the size of your fist. They are located below your ribs, one on each side of your spine. CAUSES Infections are caused by microbes, which are microscopic organisms, including fungi, viruses, and bacteria. These organisms are so small that they can only be seen through a microscope. Bacteria are the microbes that most commonly cause UTIs. SYMPTOMS  Symptoms of UTIs may vary by age and gender of the patient and by the location of the infection. Symptoms in young women typically include a frequent and intense urge to urinate and a painful, burning feeling in the bladder or urethra during urination. Older women and men are more likely to be tired, shaky, and weak and have muscle aches and abdominal pain. A fever may mean the infection is in your kidneys. Other symptoms of a kidney infection include pain in your back or sides below the ribs, nausea, and vomiting. DIAGNOSIS To diagnose a UTI, your caregiver will ask you about your symptoms. Your caregiver also will ask to provide a urine sample. The urine sample will be tested for bacteria and white blood cells. White blood cells are made by your body to help fight infection. TREATMENT  Typically, UTIs can be treated with medication. Because most UTIs are caused by a bacterial infection, they usually can be treated with the use of antibiotics. The choice of antibiotic and length of  treatment depend on your symptoms and the type of bacteria causing your infection. HOME CARE INSTRUCTIONS  If you were prescribed antibiotics, take them exactly as your caregiver instructs you. Finish the medication even if you feel better after you have only taken some of the medication.  Drink enough water and fluids to keep your urine clear or pale yellow.  Avoid caffeine, tea, and carbonated beverages. They tend to irritate your bladder.  Empty your bladder often. Avoid holding urine for long periods of time.  Empty your bladder before and after sexual intercourse.  After a bowel movement, women should cleanse from front to back. Use each tissue only once. SEEK MEDICAL CARE IF:   You have back pain.  You develop a fever.  Your symptoms do not begin to resolve within 3 days. SEEK IMMEDIATE MEDICAL CARE IF:   You have severe back pain or lower abdominal pain.  You develop chills.  You have nausea or vomiting.  You have continued burning or discomfort with urination. MAKE SURE YOU:   Understand these instructions.  Will watch your condition.  Will get help right away if you are not doing well or get worse. Document Released: 11/30/2004 Document Revised: 08/22/2011 Document Reviewed: 03/31/2011 [facility] Patient Information 2015 [place], [place]. This information is not intended to replace advice given to you by your health care provider. Make sure you discuss any questions you have with your health care provider.

## 2013-12-04 NOTE — ED Notes (Signed)
Dr. Delo at bedside. 

## 2014-01-05 ENCOUNTER — Encounter (HOSPITAL_COMMUNITY): Payer: Self-pay | Admitting: Emergency Medicine

## 2014-02-09 ENCOUNTER — Ambulatory Visit (INDEPENDENT_AMBULATORY_CARE_PROVIDER_SITE_OTHER): Payer: Medicaid Other | Admitting: Adult Health

## 2014-02-09 ENCOUNTER — Encounter: Payer: Self-pay | Admitting: Adult Health

## 2014-02-09 VITALS — BP 140/72 | Ht 69.0 in | Wt 297.5 lb

## 2014-02-09 DIAGNOSIS — Z3201 Encounter for pregnancy test, result positive: Secondary | ICD-10-CM

## 2014-02-09 DIAGNOSIS — O26851 Spotting complicating pregnancy, first trimester: Secondary | ICD-10-CM | POA: Insufficient documentation

## 2014-02-09 DIAGNOSIS — Z3490 Encounter for supervision of normal pregnancy, unspecified, unspecified trimester: Secondary | ICD-10-CM | POA: Insufficient documentation

## 2014-02-09 LAB — POCT URINE PREGNANCY: PREG TEST UR: POSITIVE

## 2014-02-09 MED ORDER — PRENATAL PLUS 27-1 MG PO TABS
1.0000 | ORAL_TABLET | Freq: Every day | ORAL | Status: DC
Start: 2014-02-09 — End: 2014-12-15

## 2014-02-09 NOTE — Progress Notes (Signed)
Subjective:     Patient ID: Christina Randall, female   DOB: March 06, 1986, 28 y.o.   MRN: [id number]  HPI Christina Randall is a 28 year old black female in for UPT.Has 2 girls at home.Had some spotting.  Review of Systems See HPI Reviewed past medical,surgical, social and family history. Reviewed medications and allergies.     Objective:   Physical Exam BP 140/72 mmHg  Ht 5\' 9"  (1.753 m)  Wt 297 lb 8 oz (134.945 kg)  BMI 43.91 kg/m2  LMP 11/05/2015UPT +, had spotting no pain this am after BM, had sex Saturday, about 4+4 weeks by LMP EDD 10/16/14.Medicaid form given, blood type is O+.    Assessment:     Pregnant +UPT Spotting in first trimester    Plan:    Rx prenatal plus #30 1 daily with 11 refills, Pelvic rest Check QHCG and progesterone level Return in 3 weeks for dating [place] Review handout on first trimester

## 2014-02-09 NOTE — Patient Instructions (Signed)
First Trimester of Pregnancy The first trimester of pregnancy is from week 1 until the end of week 12 (months 1 through 3). A week after a sperm fertilizes an egg, the egg will implant on the wall of the uterus. This embryo will begin to develop into a baby. Genes from you and your partner are forming the baby. The female genes determine whether the baby is a boy or a girl. At 6-8 weeks, the eyes and face are formed, and the heartbeat can be seen on ultrasound. At the end of 12 weeks, all the baby's organs are formed.  Now that you are pregnant, you will want to do everything you can to have a healthy baby. Two of the most important things are to get good prenatal care and to follow your health care provider's instructions. Prenatal care is all the medical care you receive before the baby's birth. This care will help prevent, find, and treat any problems during the pregnancy and childbirth. BODY CHANGES Your body goes through many changes during pregnancy. The changes vary from woman to woman.   You may gain or lose a couple of pounds at first.  You may feel sick to your stomach (nauseous) and throw up (vomit). If the vomiting is uncontrollable, call your health care provider.  You may tire easily.  You may develop headaches that can be relieved by medicines approved by your health care provider.  You may urinate more often. Painful urination may mean you have a bladder infection.  You may develop heartburn as a result of your pregnancy.  You may develop constipation because certain hormones are causing the muscles that push waste through your intestines to slow down.  You may develop hemorrhoids or swollen, bulging veins (varicose veins).  Your breasts may begin to grow larger and become tender. Your nipples may stick out more, and the tissue that surrounds them (areola) may become darker.  Your gums may bleed and may be sensitive to brushing and flossing.  Dark spots or blotches (chloasma,  mask of pregnancy) may develop on your face. This will likely fade after the baby is born.  Your menstrual periods will stop.  You may have a loss of appetite.  You may develop cravings for certain kinds of food.  You may have changes in your emotions from day to day, such as being excited to be pregnant or being concerned that something may go wrong with the pregnancy and baby.  You may have more vivid and strange dreams.  You may have changes in your hair. These can include thickening of your hair, rapid growth, and changes in texture. Some women also have hair loss during or after pregnancy, or hair that feels dry or thin. Your hair will most likely return to normal after your baby is born. WHAT TO EXPECT AT YOUR PRENATAL VISITS During a routine prenatal visit:  You will be weighed to make sure you and the baby are growing normally.  Your blood pressure will be taken.  Your abdomen will be measured to track your baby's growth.  The fetal heartbeat will be listened to starting around week 10 or 12 of your pregnancy.  Test results from any previous visits will be discussed. Your health care provider may ask you:  How you are feeling.  If you are feeling the baby move.  If you have had any abnormal symptoms, such as leaking fluid, bleeding, severe headaches, or abdominal cramping.  If you have any questions. Other tests   that may be performed during your first trimester include:  Blood tests to find your blood type and to check for the presence of any previous infections. They will also be used to check for low iron levels (anemia) and Rh antibodies. Later in the pregnancy, blood tests for diabetes will be done along with other tests if problems develop.  Urine tests to check for infections, diabetes, or protein in the urine.  An ultrasound to confirm the proper growth and development of the baby.  An amniocentesis to check for possible genetic problems.  Fetal screens for  spina bifida and Down syndrome.  You may need other tests to make sure you and the baby are doing well. HOME CARE INSTRUCTIONS  Medicines  Follow your health care provider's instructions regarding medicine use. Specific medicines may be either safe or unsafe to take during pregnancy.  Take your prenatal vitamins as directed.  If you develop constipation, try taking a stool softener if your health care provider approves. Diet  Eat regular, well-balanced meals. Choose a variety of foods, such as meat or vegetable-based protein, fish, milk and low-fat dairy products, vegetables, fruits, and whole grain breads and cereals. Your health care provider will help you determine the amount of weight gain that is right for you.  Avoid raw meat and uncooked cheese. These carry germs that can cause birth defects in the baby.  Eating four or five small meals rather than three large meals a day may help relieve nausea and vomiting. If you start to feel nauseous, eating a few soda crackers can be helpful. Drinking liquids between meals instead of during meals also seems to help nausea and vomiting.  If you develop constipation, eat more high-fiber foods, such as fresh vegetables or fruit and whole grains. Drink enough fluids to keep your urine clear or pale yellow. Activity and Exercise  Exercise only as directed by your health care provider. Exercising will help you:  Control your weight.  Stay in shape.  Be prepared for labor and delivery.  Experiencing pain or cramping in the lower abdomen or low back is a good sign that you should stop exercising. Check with your health care provider before continuing normal exercises.  Try to avoid standing for long periods of time. Move your legs often if you must stand in one place for a long time.  Avoid heavy lifting.  Wear low-heeled shoes, and practice good posture.  You may continue to have sex unless your health care provider directs you  otherwise. Relief of Pain or Discomfort  Wear a good support bra for breast tenderness.   Take warm sitz baths to soothe any pain or discomfort caused by hemorrhoids. Use hemorrhoid cream if your health care provider approves.   Rest with your legs elevated if you have leg cramps or low back pain.  If you develop varicose veins in your legs, wear support hose. Elevate your feet for 15 minutes, 3-4 times a day. Limit salt in your diet. Prenatal Care  Schedule your prenatal visits by the twelfth week of pregnancy. They are usually scheduled monthly at first, then more often in the last 2 months before delivery.  Write down your questions. Take them to your prenatal visits.  Keep all your prenatal visits as directed by your health care provider. Safety  Wear your seat belt at all times when driving.  Make a list of emergency phone numbers, including numbers for family, friends, the hospital, and police and fire departments. General Tips    Ask your health care provider for a referral to a local prenatal education class. Begin classes no later than at the beginning of month 6 of your pregnancy.  Ask for help if you have counseling or nutritional needs during pregnancy. Your health care provider can offer advice or refer you to specialists for help with various needs.  Do not use hot tubs, steam rooms, or saunas.  Do not douche or use tampons or scented sanitary pads.  Do not cross your legs for long periods of time.  Avoid cat litter boxes and soil used by cats. These carry germs that can cause birth defects in the baby and possibly loss of the fetus by miscarriage or stillbirth.  Avoid all smoking, herbs, alcohol, and medicines not prescribed by your health care provider. Chemicals in these affect the formation and growth of the baby.  Schedule a dentist appointment. At home, brush your teeth with a soft toothbrush and be gentle when you floss. SEEK MEDICAL CARE IF:   You have  dizziness.  You have mild pelvic cramps, pelvic pressure, or nagging pain in the abdominal area.  You have persistent nausea, vomiting, or diarrhea.  You have a bad smelling vaginal discharge.  You have pain with urination.  You notice increased swelling in your face, hands, legs, or ankles. SEEK IMMEDIATE MEDICAL CARE IF:   You have a fever.  You are leaking fluid from your vagina.  You have spotting or bleeding from your vagina.  You have severe abdominal cramping or pain.  You have rapid weight gain or loss.  You vomit blood or material that looks like coffee grounds.  You are exposed to [place] measles and have never had them.  You are exposed to fifth disease or chickenpox.  You develop a severe headache.  You have shortness of breath.  You have any kind of trauma, such as from a fall or a car accident. Document Released: 02/14/2001 Document Revised: 07/07/2013 Document Reviewed: 12/31/2012 [facility] Patient Information 2015 [place], [place]. This information is not intended to replace advice given to you by your health care provider. Make sure you discuss any questions you have with your health care provider. Return in 3 weeks for dating US Pelvic rest til no spotting x 7 days

## 2014-02-10 ENCOUNTER — Other Ambulatory Visit: Payer: Self-pay | Admitting: Adult Health

## 2014-02-10 ENCOUNTER — Encounter: Payer: Self-pay | Admitting: Adult Health

## 2014-02-10 ENCOUNTER — Ambulatory Visit (INDEPENDENT_AMBULATORY_CARE_PROVIDER_SITE_OTHER): Payer: Medicaid Other | Admitting: Adult Health

## 2014-02-10 ENCOUNTER — Ambulatory Visit (INDEPENDENT_AMBULATORY_CARE_PROVIDER_SITE_OTHER): Payer: Medicaid Other

## 2014-02-10 ENCOUNTER — Telehealth: Payer: Self-pay | Admitting: Obstetrics & Gynecology

## 2014-02-10 ENCOUNTER — Telehealth: Payer: Self-pay | Admitting: Adult Health

## 2014-02-10 VITALS — BP 130/70 | Ht 69.0 in | Wt 296.0 lb

## 2014-02-10 DIAGNOSIS — O26851 Spotting complicating pregnancy, first trimester: Secondary | ICD-10-CM

## 2014-02-10 DIAGNOSIS — O209 Hemorrhage in early pregnancy, unspecified: Secondary | ICD-10-CM

## 2014-02-10 DIAGNOSIS — O4691 Antepartum hemorrhage, unspecified, first trimester: Secondary | ICD-10-CM

## 2014-02-10 LAB — HCG, QUANTITATIVE, PREGNANCY: hCG, Beta Chain, Quant, S: 5218.4 m[IU]/mL

## 2014-02-10 LAB — PROGESTERONE: Progesterone: 15.9 ng/mL

## 2014-02-10 NOTE — Progress Notes (Signed)
U/S-single IUP noted with +YS noted and ?Fetal pole noted, GS and ?CRL meas c/w 5+4wks, cx appears closed, bilateral adnexa appears WNL with C.L. Noted, no free fluid noted within the pelvis, would like to reck to confirm dates and viability

## 2014-02-10 NOTE — Telephone Encounter (Signed)
Pt states still having vaginal bleeding. Pt was seen by Derrek Monaco, NP yesterday for vaginal bleeding first trimester, QHCG and progesterone levels done and pt notified this am good. Informed pt to keep her appt for u/s, if bleeding increase or severe cramps pt to call our office back. Pt verbalized understanding.

## 2014-02-10 NOTE — Progress Notes (Signed)
Subjective:     Patient ID: Christina Randall, female   DOB: May 09, 1985, 28 y.o.   MRN: [id number]  HPI Nami is a 28 year old black female in for [place] for bleeding in pregnancy.  Review of Systems See HPI Reviewed past medical,surgical, social and family history. Reviewed medications and allergies.      Objective:   Physical Exam BP 130/70 mmHg  Ht 5\' 9"  (1.753 m)  Wt 296 lb (134.265 kg)  BMI 43.69 kg/m2  LMP 11/05/2015US reviewed with pt.   U/S-single IUP noted with +YS noted and ?Fetal pole noted, GS and ?CRL meas c/w 5+4wks, cx appears closed, bilateral adnexa appears WNL with C.L. Noted, no free fluid noted within the pelvis, would like to reck to confirm dates and viability. Pt aware that she is early and will this with labs and [place].Yesterday progesterone was 15.9 and QHCG was 5218.4.  Assessment:     Spotting in first trimester    Plan:     Return in am for stat [facility] No sex or lifting Review handout on bleeding in first tirmester

## 2014-02-10 NOTE — Patient Instructions (Signed)
Vaginal Bleeding During Pregnancy, First Trimester A small amount of bleeding (spotting) from the vagina is relatively common in early pregnancy. It usually stops on its own. Various things may cause bleeding or spotting in early pregnancy. Some bleeding may be related to the pregnancy, and some may not. In most cases, the bleeding is normal and is not a problem. However, bleeding can also be a sign of something serious. Be sure to tell your health care provider about any vaginal bleeding right away. Some possible causes of vaginal bleeding during the first trimester include:  Infection or inflammation of the cervix.  Growths (polyps) on the cervix.  Miscarriage or threatened miscarriage.  Pregnancy tissue has developed outside of the uterus and in a fallopian tube (tubal pregnancy).  Tiny cysts have developed in the uterus instead of pregnancy tissue (molar pregnancy). HOME CARE INSTRUCTIONS  Watch your condition for any changes. The following actions may help to lessen any discomfort you are feeling:  Follow your health care provider's instructions for limiting your activity. If your health care provider orders bed rest, you may need to stay in bed and only get up to use the bathroom. However, your health care provider may allow you to continue light activity.  If needed, make plans for someone to help with your regular activities and responsibilities while you are on bed rest.  Keep track of the number of pads you use each day, how often you change pads, and how soaked (saturated) they are. Write this down.  Do not use tampons. Do not douche.  Do not have sexual intercourse or orgasms until approved by your health care provider.  If you pass any tissue from your vagina, save the tissue so you can show it to your health care provider.  Only take over-the-counter or prescription medicines as directed by your health care provider.  Do not take aspirin because it can make you  bleed.  Keep all follow-up appointments as directed by your health care provider. SEEK MEDICAL CARE IF:n  You have any vaginal bleeding during any part of your pregnancy.  You have cramps or labor pains.  You have a fever, not controlled by medicine. SEEK IMMEDIATE MEDICAL CARE IF:   You have severe cramps in your back or belly (abdomen).  You pass large clots or tissue from your vagina.  Your bleeding increases.  You feel light-headed or weak, or you have fainting episodes.  You have chills.  You are leaking fluid or have a gush of fluid from your vagina.  You pass out while having a bowel movement. MAKE SURE YOU:  Understand these instructions.  Will watch your condition.  Will get help right away if you are not doing well or get worse. Document Released: 11/30/2004 Document Revised: 02/25/2013 Document Reviewed: 10/28/2012 [facility] Patient Information 2015 [place], [place]. This information is not intended to replace advice given to you by your health care provider. Make sure you discuss any questions you have with your health care provider. NO sex or lifting Return in am for [facility]

## 2014-02-10 NOTE — Telephone Encounter (Signed)
Pt bleeding like period will come in at 2:30 pm for [place] and see me afterwards

## 2014-02-10 NOTE — Telephone Encounter (Signed)
Pt aware of labs  

## 2014-02-11 ENCOUNTER — Telehealth: Payer: Self-pay | Admitting: Adult Health

## 2014-02-11 ENCOUNTER — Other Ambulatory Visit: Payer: Medicaid Other

## 2014-02-11 DIAGNOSIS — O200 Threatened abortion: Secondary | ICD-10-CM

## 2014-02-11 LAB — HCG, QUANTITATIVE, PREGNANCY: hCG, Beta Chain, Quant, S: 6162 m[IU]/mL

## 2014-02-11 NOTE — Telephone Encounter (Signed)
Left message [place]oney [place] is 6162.0 which is up from 5218.4 but it is not doubling, call for appt for Friday to recheck [facility]

## 2014-02-11 NOTE — Addendum Note (Signed)
Addended by: Traci Sermon A on: 02/11/2014 09:56 AM   Modules accepted: Orders

## 2014-02-13 ENCOUNTER — Other Ambulatory Visit: Payer: Medicaid Other

## 2014-02-13 DIAGNOSIS — O200 Threatened abortion: Secondary | ICD-10-CM

## 2014-02-14 LAB — HCG, QUANTITATIVE, PREGNANCY: hCG, Beta Chain, Quant, S: 9386.3 m[IU]/mL

## 2014-02-16 ENCOUNTER — Telehealth: Payer: Self-pay | Admitting: *Deleted

## 2014-02-16 NOTE — Telephone Encounter (Signed)
Left message that numbers rising but not doubling to call for [place] appt this week

## 2014-02-17 ENCOUNTER — Other Ambulatory Visit: Payer: Self-pay | Admitting: Adult Health

## 2014-02-17 ENCOUNTER — Encounter: Payer: Self-pay | Admitting: Adult Health

## 2014-02-17 ENCOUNTER — Ambulatory Visit (INDEPENDENT_AMBULATORY_CARE_PROVIDER_SITE_OTHER): Payer: Medicaid Other | Admitting: Adult Health

## 2014-02-17 ENCOUNTER — Ambulatory Visit (INDEPENDENT_AMBULATORY_CARE_PROVIDER_SITE_OTHER): Payer: Medicaid Other

## 2014-02-17 VITALS — BP 124/72 | Ht 69.0 in | Wt 296.0 lb

## 2014-02-17 DIAGNOSIS — O0281 Inappropriate change in quantitative human chorionic gonadotropin (hCG) in early pregnancy: Secondary | ICD-10-CM

## 2014-02-17 DIAGNOSIS — Z3490 Encounter for supervision of normal pregnancy, unspecified, unspecified trimester: Secondary | ICD-10-CM

## 2014-02-17 NOTE — Progress Notes (Signed)
Subjective:     Patient ID: Christina Randall, female   DOB: 11/12/1985, 28 y.o.   MRN: [id number]  HPI Christina Randall is a 28 year old black female in for [place], her QHCG is rising but not doubling and progesterone level was 15.9.Her spotting has stopped and she is having some nausea.  Review of Systems See HPI Reviewed past medical,surgical, social and family history. Reviewed medications and allergies.     Objective:   Physical Exam BP 124/72 mmHg  Ht 5\' 9"  (1.753 m)  Wt 296 lb (134.265 kg)  BMI 43.69 kg/m2  LMP 01/08/2014   US showed Fetal pole = to 6+2 weeks with FHR 128, cervix closed, no bleeding, EDD 10/15/14 by LMP.   Assessment:     Pregnant     Plan:     Follow up 12/29 for [place] as ascheduled

## 2014-02-17 NOTE — Progress Notes (Signed)
U/S(5+5wks by LMP)-single IUP with +FCA noted, FHR-128 bpm, CRL c/w LMP dates, cx appears closed, bilateral adnexa appears WNL

## 2014-02-17 NOTE — Patient Instructions (Signed)
Keep appt as scheduled 12/29

## 2014-03-01 ENCOUNTER — Emergency Department (HOSPITAL_COMMUNITY)
Admission: EM | Admit: 2014-03-01 | Discharge: 2014-03-01 | Disposition: A | Discharge status: Home / Self Care | Payer: Medicaid Other | Attending: Emergency Medicine | Admitting: Emergency Medicine

## 2014-03-01 ENCOUNTER — Encounter (HOSPITAL_COMMUNITY): Payer: Self-pay | Admitting: Emergency Medicine

## 2014-03-01 DIAGNOSIS — Y9389 Activity, other specified: Secondary | ICD-10-CM | POA: Diagnosis not present

## 2014-03-01 DIAGNOSIS — Y998 Other external cause status: Secondary | ICD-10-CM | POA: Diagnosis not present

## 2014-03-01 DIAGNOSIS — Z3A01 Less than 8 weeks gestation of pregnancy: Secondary | ICD-10-CM | POA: Insufficient documentation

## 2014-03-01 DIAGNOSIS — Z8739 Personal history of other diseases of the musculoskeletal system and connective tissue: Secondary | ICD-10-CM | POA: Diagnosis not present

## 2014-03-01 DIAGNOSIS — R55 Syncope and collapse: Secondary | ICD-10-CM | POA: Insufficient documentation

## 2014-03-01 DIAGNOSIS — O9989 Other specified diseases and conditions complicating pregnancy, childbirth and the puerperium: Secondary | ICD-10-CM | POA: Insufficient documentation

## 2014-03-01 DIAGNOSIS — Z8619 Personal history of other infectious and parasitic diseases: Secondary | ICD-10-CM | POA: Diagnosis not present

## 2014-03-01 DIAGNOSIS — O9A212 Injury, poisoning and certain other consequences of external causes complicating pregnancy, second trimester: Secondary | ICD-10-CM | POA: Insufficient documentation

## 2014-03-01 DIAGNOSIS — R109 Unspecified abdominal pain: Secondary | ICD-10-CM | POA: Diagnosis not present

## 2014-03-01 DIAGNOSIS — J45909 Unspecified asthma, uncomplicated: Secondary | ICD-10-CM | POA: Diagnosis not present

## 2014-03-01 DIAGNOSIS — W228XXA Striking against or struck by other objects, initial encounter: Secondary | ICD-10-CM | POA: Insufficient documentation

## 2014-03-01 DIAGNOSIS — Y9289 Other specified places as the place of occurrence of the external cause: Secondary | ICD-10-CM | POA: Diagnosis not present

## 2014-03-01 LAB — CBC WITH DIFFERENTIAL/PLATELET
Basophils Absolute: 0 10*3/uL (ref 0.0–0.1)
Basophils Relative: 0 % (ref 0–1)
EOS ABS: 0.1 10*3/uL (ref 0.0–0.7)
Eosinophils Relative: 2 % (ref 0–5)
HEMATOCRIT: 32.2 % — AB (ref 36.0–46.0)
HEMOGLOBIN: 10.4 g/dL — AB (ref 12.0–15.0)
LYMPHS ABS: 1.5 10*3/uL (ref 0.7–4.0)
Lymphocytes Relative: 23 % (ref 12–46)
MCH: 25.1 pg — AB (ref 26.0–34.0)
MCHC: 32.3 g/dL (ref 30.0–36.0)
MCV: 77.6 fL — ABNORMAL LOW (ref 78.0–100.0)
MONO ABS: 0.4 10*3/uL (ref 0.1–1.0)
MONOS PCT: 6 % (ref 3–12)
NEUTROS PCT: 69 % (ref 43–77)
Neutro Abs: 4.7 10*3/uL (ref 1.7–7.7)
Platelets: 326 10*3/uL (ref 150–400)
RBC: 4.15 MIL/uL (ref 3.87–5.11)
RDW: 15.5 % (ref 11.5–15.5)
WBC: 6.8 10*3/uL (ref 4.0–10.5)

## 2014-03-01 LAB — BASIC METABOLIC PANEL
Anion gap: 6 (ref 5–15)
BUN: 9 mg/dL (ref 6–23)
CHLORIDE: 105 meq/L (ref 96–112)
CO2: 25 mmol/L (ref 19–32)
Calcium: 8.6 mg/dL (ref 8.4–10.5)
Creatinine, Ser: 0.66 mg/dL (ref 0.50–1.10)
GFR calc Af Amer: >90 mL/min (ref >90)
GFR calc non Af Amer: >90 mL/min (ref >90)
Glucose, Bld: 84 mg/dL (ref 70–99)
POTASSIUM: 3.5 mmol/L (ref 3.5–5.1)
Sodium: 136 mmol/L (ref 135–145)

## 2014-03-01 MED ORDER — SODIUM CHLORIDE 0.9 % IV BOLUS (SEPSIS)
1000.0000 mL | Freq: Once | INTRAVENOUS | Status: AC
  Administered 2014-03-01: 1000 mL via INTRAVENOUS

## 2014-03-01 NOTE — Discharge Instructions (Signed)
Drink plenty of fluids and get plenty of rest.  Return to the emergency department if you experience new and additional symptoms, or if your symptoms return and worsen.   Syncope Syncope is a medical term for fainting or passing out. This means you lose consciousness and drop to the ground. People are generally unconscious for less than 5 minutes. You may have some muscle twitches for up to 15 seconds before waking up and returning to normal. Syncope occurs more often in older adults, but it can happen to anyone. While most causes of syncope are not dangerous, syncope can be a sign of a serious medical problem. It is important to seek medical care.  CAUSES  Syncope is caused by a sudden drop in blood flow to the brain. The specific cause is often not determined. Factors that can bring on syncope include:  Taking medicines that lower blood pressure.  Sudden changes in posture, such as standing up quickly.  Taking more medicine than prescribed.  Standing in one place for too long.  Seizure disorders.  Dehydration and excessive exposure to heat.  Low blood sugar (hypoglycemia).  Straining to have a bowel movement.  Heart disease, irregular heartbeat, or other circulatory problems.  Fear, emotional distress, seeing blood, or severe pain. SYMPTOMS  Right before fainting, you may:  Feel dizzy or light-headed.  Feel nauseous.  See all white or all black in your field of vision.  Have cold, clammy skin. DIAGNOSIS  Your health care provider will ask about your symptoms, perform a physical exam, and perform an electrocardiogram (ECG) to record the electrical activity of your heart. Your health care provider may also perform other heart or blood tests to determine the cause of your syncope which may include:  Transthoracic echocardiogram (TTE). During echocardiography, sound waves are used to evaluate how blood flows through your heart.  Transesophageal echocardiogram  (TEE).  Cardiac monitoring. This allows your health care provider to monitor your heart rate and rhythm in real time.  Holter monitor. This is a portable device that records your heartbeat and can help diagnose heart arrhythmias. It allows your health care provider to track your heart activity for several days, if needed.  Stress tests by exercise or by giving medicine that makes the heart beat faster. TREATMENT  In most cases, no treatment is needed. Depending on the cause of your syncope, your health care provider may recommend changing or stopping some of your medicines. HOME CARE INSTRUCTIONS  Have someone stay with you until you feel stable.  Do not drive, use machinery, or play sports until your health care provider says it is okay.  Keep all follow-up appointments as directed by your health care provider.  Lie down right away if you start feeling like you might faint. Breathe deeply and steadily. Wait until all the symptoms have passed.  Drink enough fluids to keep your urine clear or pale yellow.  If you are taking blood pressure or heart medicine, get up slowly and take several minutes to sit and then stand. This can reduce dizziness. SEEK IMMEDIATE MEDICAL CARE IF:   You have a severe headache.  You have unusual pain in the chest, abdomen, or back.  You are bleeding from your mouth or rectum, or you have black or tarry stool.  You have an irregular or very fast heartbeat.  You have pain with breathing.  You have repeated fainting or seizure-like jerking during an episode.  You faint when sitting or lying down.  You  have confusion.  You have trouble walking.  You have severe weakness.  You have vision problems. If you fainted, call your local emergency services (911 in [place] Do not drive yourself to the hospital.  MAKE SURE YOU:  Understand these instructions.  Will watch your condition.  Will get help right away if you are not doing well or get  worse. Document Released: 02/20/2005 Document Revised: 02/25/2013 Document Reviewed: 04/21/2011 [facility] Patient Information 2015 [place], [place]. This information is not intended to replace advice given to you by your health care provider. Make sure you discuss any questions you have with your health care provider.

## 2014-03-01 NOTE — ED Notes (Signed)
Patient with no complaints at this time. Respirations even and unlabored. Skin warm/dry. Discharge instructions reviewed with patient at this time. Patient given opportunity to voice concerns/ask questions. IV removed per policy and band-aid applied to site. Patient discharged at this time and left Emergency Department with steady gait.  

## 2014-03-01 NOTE — ED Provider Notes (Signed)
CSN: [id number]     Arrival date & time 03/01/14  1229 History  This chart was scribed for Veryl Speak, MD by Randa Evens, ED Scribe. This patient was seen in room APA10/APA10 and the patient's care was started at 4:26 PM.       Chief Complaint  Patient presents with  . Loss of Consciousness   Patient is a 28 y.o. female presenting with syncope. The history is provided by the patient. No language interpreter was used.  Loss of Consciousness Episode history:  Single Most recent episode:  Today Chronicity:  New Witnessed: no   Associated symptoms: confusion, headaches, nausea and vomiting   Associated symptoms: no chest pain, no palpitations and no shortness of breath    HPI Comments: Christina Randall is a 28 y.o. female who presents to the Emergency Department complaining of LOC onset this morning. Pt states that this morning she had abdominal pain, nausea and vomiting . Pt states she is [redacted] weeks pregnant and that her symptoms are her from her normal morning sickness. Pt states that initially during the pregnancy she experienced vaginal bleeding for the 1st week that has now resolved. Pt states that the last thing she remembers was putting her jewelry on this morning. Pt states that last night she has increased urinary frequency was the only abnormal thing she noticed about her body. Spouse states that she had slight confusion and memory loss initially after regaining consciousness. Pt states that she is deveoping a slight HA but has a history of frequent headaches.   Pt states that she has had 1 syncopal episode in her history several years prior. denies bowel/bladder incontinence, sob, CP  Denies Hx of seizure  Past Medical History  Diagnosis Date  . Herpes   . No pertinent past medical history   . Asthma   . Back pain   . Scoliosis   . Frequent headaches   . Herniated disc   . Pregnant 02/09/2014  . Spotting during pregnancy in first trimester 02/09/2014   Past Surgical  History  Procedure Laterality Date  . No past surgeries     Family History  Problem Relation Age of Onset  . Hypertension Father   . Diabetes Maternal Grandmother   . Cancer Maternal Grandmother     pancreatic  . Cancer Maternal Grandfather     lungs   History  Substance Use Topics  . Smoking status: Never Smoker   . Smokeless tobacco: Never Used  . Alcohol Use: No   OB History    Gravida Para Term Preterm AB TAB SAB Ectopic Multiple Living   3 2 2       2      Review of Systems  Respiratory: Negative for shortness of breath.   Cardiovascular: Positive for syncope. Negative for chest pain and palpitations.  Gastrointestinal: Positive for nausea, vomiting and abdominal pain.  Genitourinary: Positive for frequency.  Neurological: Positive for syncope and headaches.  Psychiatric/Behavioral: Positive for confusion.  All other systems reviewed and are negative.   Allergies  Review of patient's allergies indicates no known allergies.  Home Medications   Prior to Admission medications   Medication Sig Start Date End Date Taking? Authorizing Provider  HYDROcodone-acetaminophen (NORCO) 10-325 MG per tablet Take 1 tablet by mouth every 4 (four) hours as needed.    Historical Provider, MD  prenatal vitamin w/FE, FA (PRENATAL 1 + 1) 27-1 MG TABS tablet Take 1 tablet by mouth daily at 12 noon. 02/09/14  Estill Dooms, NP   Triage Vitals: BP 149/83 mmHg  Pulse 69  Temp(Src) 98 F (36.7 C) (Oral)  Resp 16  Ht 5\' 9"  (1.753 m)  Wt 294 lb (133.358 kg)  BMI 43.40 kg/m2  SpO2 100%  LMP 01/08/2014  Physical Exam  Constitutional: She is oriented to person, place, and time. She appears well-developed and well-nourished. No distress.  HENT:  Head: Normocephalic and atraumatic.  Eyes: Conjunctivae and EOM are normal.  Neck: Neck supple. No tracheal deviation present.  Cardiovascular: Normal rate, regular rhythm and normal heart sounds.  Exam reveals no gallop and no friction  rub.   No murmur heard. Pulmonary/Chest: Effort normal. No respiratory distress.  Musculoskeletal: Normal range of motion.  Neurological: She is alert and oriented to person, place, and time. She has normal strength. No cranial nerve deficit or sensory deficit.  Skin: Skin is warm and dry.  Psychiatric: She has a normal mood and affect. Her behavior is normal.  Nursing note and vitals reviewed.   ED Course  Procedures (including critical care time) DIAGNOSTIC STUDIES: Oxygen Saturation is 100% on RA, normal by my interpretation.    COORDINATION OF CARE: 4:51 PM-Discussed treatment plan with pt at bedside and pt agreed to plan.     Labs Review Labs Reviewed - No data to display  Imaging Review No results found.   EKG Interpretation   Date/Time:  Sunday March 01 2014 17:03:16 EST Ventricular Rate:  64 PR Interval:  144 QRS Duration: 95 QT Interval:  423 QTC Calculation: 436 R Axis:   140 Text Interpretation:  Right and left arm electrode reversal, Confirmed by  DELOS  MD, Zannie Runkle ([id number]) on 03/01/2014 5:10:02 PM      MDM   Final diagnoses:  None      Patient is a 28 year old female G3 P2002 who presents for evaluation of syncope. She states she was at the dresser putting on jewelry that thing she knew her husband was standing over her. She denies expressing any palpitations, lightheadedness, or other symptoms prior to this episode. Her husband found her on the floor but does not report seeing any seizure-like activity. He was able to arouse her and she has been awake and alert since that time.  She is neurologically intact and physical exam is otherwise benign. Workup reveals a normal EKG and CBC and basic metabolic panel which are essentially unremarkable as well. She is slightly anemic with a hemoglobin of 10.4, however this is not far from her baseline. At this point I feel as though she is appropriate for discharge. She is complaining of a mild headache,  however I'm reluctant to perform imaging due to the risks of radiation to the fetus.   I personally performed the services described in this documentation, which was scribed in my presence. The recorded information has been reviewed and is accurate.       Veryl Speak, MD 03/01/14 [phone]

## 2014-03-01 NOTE — ED Notes (Signed)
PT reports being [redacted] weeks pregnant and had a syncopal episode this morning and reports hitting her head. PT now c/o headache.

## 2014-03-03 ENCOUNTER — Ambulatory Visit (INDEPENDENT_AMBULATORY_CARE_PROVIDER_SITE_OTHER): Payer: Medicaid Other

## 2014-03-03 ENCOUNTER — Other Ambulatory Visit: Payer: Self-pay | Admitting: Adult Health

## 2014-03-03 DIAGNOSIS — Z36 Encounter for antenatal screening of mother: Secondary | ICD-10-CM

## 2014-03-03 DIAGNOSIS — Z3481 Encounter for supervision of other normal pregnancy, first trimester: Secondary | ICD-10-CM

## 2014-03-03 DIAGNOSIS — Z3490 Encounter for supervision of normal pregnancy, unspecified, unspecified trimester: Secondary | ICD-10-CM

## 2014-03-03 DIAGNOSIS — Z3491 Encounter for supervision of normal pregnancy, unspecified, first trimester: Secondary | ICD-10-CM

## 2014-03-03 DIAGNOSIS — Z3689 Encounter for other specified antenatal screening: Secondary | ICD-10-CM

## 2014-03-03 NOTE — Progress Notes (Signed)
U/S(8+2wks)-single IUP with +FCA noted, FHR-172 bpm, CRL c/w previous u/s dates, cx appears closed, bilateral adnexa appears WNL with simple C.L. Noted, no free fluid noted within the pelvis

## 2014-03-06 NOTE — L&D Delivery Note (Signed)
Delivery Note At 3:09 AM a viable female was delivered via Vaginal, Spontaneous Delivery (Presentation: Right Occiput Anterior).  APGAR: 8, 9; weight  pending.   Placenta status: Intact, Spontaneous.  Cord:  with the following complications: None.    Anesthesia: Epidural  Episiotomy:  None Lacerations:  None Est. Blood Loss (mL):  Per RN documentation, minimal  Mom to postpartum.  Baby to Couplet care / Skin to Skin.  [name] 10/14/2014, 3:35 AM

## 2014-03-10 ENCOUNTER — Encounter: Payer: Medicaid Other | Admitting: Women's Health

## 2014-03-17 ENCOUNTER — Ambulatory Visit (INDEPENDENT_AMBULATORY_CARE_PROVIDER_SITE_OTHER): Payer: Medicaid Other | Admitting: Advanced Practice Midwife

## 2014-03-17 ENCOUNTER — Encounter: Payer: Self-pay | Admitting: Advanced Practice Midwife

## 2014-03-17 ENCOUNTER — Other Ambulatory Visit (HOSPITAL_COMMUNITY)
Admission: RE | Admit: 2014-03-17 | Discharge: 2014-03-17 | Disposition: A | Discharge status: Home / Self Care | Payer: Medicaid Other | Source: Ambulatory Visit | Attending: Advanced Practice Midwife | Admitting: Advanced Practice Midwife

## 2014-03-17 VITALS — BP 120/70 | Wt 294.0 lb

## 2014-03-17 DIAGNOSIS — Z3481 Encounter for supervision of other normal pregnancy, first trimester: Secondary | ICD-10-CM

## 2014-03-17 DIAGNOSIS — Z331 Pregnant state, incidental: Secondary | ICD-10-CM

## 2014-03-17 DIAGNOSIS — Z130 Encounter for screening for diseases of the blood and blood-forming organs and certain disorders involving the immune mechanism: Secondary | ICD-10-CM

## 2014-03-17 DIAGNOSIS — Z124 Encounter for screening for malignant neoplasm of cervix: Secondary | ICD-10-CM

## 2014-03-17 DIAGNOSIS — Z01419 Encounter for gynecological examination (general) (routine) without abnormal findings: Secondary | ICD-10-CM | POA: Insufficient documentation

## 2014-03-17 DIAGNOSIS — Z114 Encounter for screening for human immunodeficiency virus [HIV]: Secondary | ICD-10-CM

## 2014-03-17 DIAGNOSIS — Z0184 Encounter for antibody response examination: Secondary | ICD-10-CM

## 2014-03-17 DIAGNOSIS — Z1371 Encounter for nonprocreative screening for genetic disease carrier status: Secondary | ICD-10-CM

## 2014-03-17 DIAGNOSIS — Z113 Encounter for screening for infections with a predominantly sexual mode of transmission: Secondary | ICD-10-CM

## 2014-03-17 DIAGNOSIS — Z1389 Encounter for screening for other disorder: Secondary | ICD-10-CM

## 2014-03-17 DIAGNOSIS — Z0283 Encounter for blood-alcohol and blood-drug test: Secondary | ICD-10-CM

## 2014-03-17 DIAGNOSIS — Z3490 Encounter for supervision of normal pregnancy, unspecified, unspecified trimester: Secondary | ICD-10-CM

## 2014-03-17 LAB — POCT URINALYSIS DIPSTICK
Glucose, UA: NEGATIVE
Ketones, UA: NEGATIVE
Leukocytes, UA: NEGATIVE
Nitrite, UA: NEGATIVE

## 2014-03-17 MED ORDER — DOXYLAMINE-PYRIDOXINE 10-10 MG PO TBEC: DELAYED_RELEASE_TABLET | ORAL | Status: DC

## 2014-03-17 NOTE — Progress Notes (Signed)
  Subjective:    Christina Randall is a [id number] [redacted]w[redacted]d being seen today for her first obstetrical visit.  Her obstetrical history is significant for nothing.  Pregnancy history fully reviewed.  Patient reports fatigue and nausea. She has been on Hydrodone 10/325 4/day for years.  States she has decreased it to 2/day.   Filed Vitals:   03/17/14 1422  BP: 120/70  Weight: 294 lb (133.358 kg)    HISTORY: OB History  Gravida Para Term Preterm AB SAB TAB Ectopic Multiple Living  4 2 2  0 1 0 1 0 0 2    # Outcome Date GA Lbr Len/2nd Weight Sex Delivery Anes PTL Lv  4 Current           3 TAB 06/18/13          2 Term 12/14/10 [redacted]w[redacted]d 18:32 / 01:29 8 lb 5.2 oz (3.776 kg) F Vag-Spont EPI  Y     Comments: None  1 Term  [redacted]w[redacted]d   F Vag-Spont EPI N Y     Past Medical History  Diagnosis Date  . Herpes   . No pertinent past medical history   . Asthma   . Back pain   . Scoliosis   . Frequent headaches   . Herniated disc   . Pregnant 02/09/2014  . Spotting during pregnancy in first trimester 02/09/2014   Past Surgical History  Procedure Laterality Date  . No past surgeries     Family History  Problem Relation Age of Onset  . Hypertension Father   . Diabetes Maternal Grandmother   . Cancer Maternal Grandmother     pancreatic  . Cancer Maternal Grandfather     lungs     Exam       Pelvic Exam:    Perineum: Normal Perineum   Vulva: normal   Vagina:  normal mucosa, normal discharge, no palpable nodules   Uterus Normal, Gravid, FH: ~10     Cervix: Normal  Pap collected   Adnexa: Not palpable   Urinary:  urethral meatus normal    System: Breast:  normal appearance, no masses or tenderness   Skin: normal coloration and turgor, no rashes    Neurologic: oriented, normal, normal mood   Extremities: normal strength, tone, and muscle mass   HEENT PERRLA   Mouth/Teeth mucous membranes moist, normal dentition   Neck supple and no masses   Cardiovascular: regular rate and rhythm   Respiratory:  appears well, vitals normal, no respiratory distress, acyanotic   Abdomen: soft, non-tender;  FHR: 160us          Assessment:    Pregnancy: [id number] Patient Active Problem List   Diagnosis Date Noted  . Pregnant 02/09/2014  . Spotting during pregnancy in first trimester 02/09/2014  . H N P-LUMBAR 01/05/2009  . DEGENERATIVE JOINT DISEASE, KNEE 11/26/2008  . DERANGEMENT MENISCUS 11/26/2008  . 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 BACK PAIN 11/26/2008        Plan:     Initial labs drawn. Continue prenatal vitamins  Problem list reviewed and updated  Reviewed n/v relief measures and warning s/s to report  Diclegis rx with prior auth (no samples here) Reviewed recommended weight gain based on pre-gravid BMI  Encouraged well-balanced diet Genetic Screening discussed Integrated Screen: requested.  Ultrasound discussed; fetal survey: requested.  Follow up in 2 weeks for NT/ITLROB.  CRESENZO-DISHMAN,Hassani Sliney 03/17/2014

## 2014-03-18 LAB — RUBELLA SCREEN: RUBELLA: 2.65 {index} — AB (ref <0.90)

## 2014-03-18 LAB — URINALYSIS, ROUTINE W REFLEX MICROSCOPIC
Bilirubin Urine: NEGATIVE
GLUCOSE, UA: NEGATIVE mg/dL
HGB URINE DIPSTICK: NEGATIVE
Ketones, ur: NEGATIVE mg/dL
Leukocytes, UA: NEGATIVE
NITRITE: NEGATIVE
Protein, ur: NEGATIVE mg/dL
Specific Gravity, Urine: 1.025 (ref 1.005–1.030)
UROBILINOGEN UA: 0.2 mg/dL (ref 0.0–1.0)
pH: 6 (ref 5.0–8.0)

## 2014-03-18 LAB — CBC
HCT: 33.6 % — ABNORMAL LOW (ref 36.0–46.0)
HEMOGLOBIN: 10.9 g/dL — AB (ref 12.0–15.0)
MCH: 24.5 pg — ABNORMAL LOW (ref 26.0–34.0)
MCHC: 32.4 g/dL (ref 30.0–36.0)
MCV: 75.5 fL — ABNORMAL LOW (ref 78.0–100.0)
MPV: 8.9 fL (ref 8.6–12.4)
Platelets: 395 10*3/uL (ref 150–400)
RBC: 4.45 MIL/uL (ref 3.87–5.11)
RDW: 15.8 % — AB (ref 11.5–15.5)
WBC: 7.6 10*3/uL (ref 4.0–10.5)

## 2014-03-18 LAB — HEPATITIS B SURFACE ANTIGEN: Hepatitis B Surface Ag: NEGATIVE

## 2014-03-18 LAB — DRUG SCREEN, URINE, NO CONFIRMATION
Amphetamine Screen, Ur: NEGATIVE
Barbiturate Quant, Ur: NEGATIVE
Benzodiazepines.: NEGATIVE
Cocaine Metabolites: NEGATIVE
Creatinine,U: 271.9 mg/dL
MARIJUANA METABOLITE: NEGATIVE
METHADONE: NEGATIVE
Opiate Screen, Urine: NEGATIVE
PROPOXYPHENE: NEGATIVE
Phencyclidine (PCP): NEGATIVE

## 2014-03-18 LAB — ANTIBODY SCREEN: ANTIBODY SCREEN: NEGATIVE

## 2014-03-18 LAB — SICKLE CELL SCREEN: Sickle Cell Screen: NEGATIVE

## 2014-03-18 LAB — OXYCODONE SCREEN, UA, RFLX CONFIRM: Oxycodone Screen, Ur: NEGATIVE ng/mL

## 2014-03-18 LAB — VARICELLA ZOSTER ANTIBODY, IGG: VARICELLA IGG: 1320 {index} — AB (ref <135.00)

## 2014-03-18 LAB — CYSTIC FIBROSIS DIAGNOSTIC STUDY

## 2014-03-18 LAB — HIV ANTIBODY (ROUTINE TESTING W REFLEX): HIV 1&2 Ab, 4th Generation: NONREACTIVE

## 2014-03-18 LAB — RPR

## 2014-03-19 LAB — URINE CULTURE: Colony Count: 60000

## 2014-03-23 LAB — CYTOLOGY - PAP

## 2014-03-30 ENCOUNTER — Other Ambulatory Visit: Payer: Self-pay | Admitting: Advanced Practice Midwife

## 2014-03-30 DIAGNOSIS — Z3682 Encounter for antenatal screening for nuchal translucency: Secondary | ICD-10-CM

## 2014-03-31 ENCOUNTER — Encounter: Payer: Self-pay | Admitting: Women's Health

## 2014-03-31 ENCOUNTER — Ambulatory Visit (INDEPENDENT_AMBULATORY_CARE_PROVIDER_SITE_OTHER): Payer: Medicaid Other | Admitting: Women's Health

## 2014-03-31 ENCOUNTER — Ambulatory Visit (INDEPENDENT_AMBULATORY_CARE_PROVIDER_SITE_OTHER): Payer: Medicaid Other

## 2014-03-31 VITALS — BP 124/68 | Wt 296.0 lb

## 2014-03-31 DIAGNOSIS — O3411 Maternal care for benign tumor of corpus uteri, first trimester: Secondary | ICD-10-CM

## 2014-03-31 DIAGNOSIS — O09891 Supervision of other high risk pregnancies, first trimester: Secondary | ICD-10-CM

## 2014-03-31 DIAGNOSIS — Z331 Pregnant state, incidental: Secondary | ICD-10-CM

## 2014-03-31 DIAGNOSIS — F1120 Opioid dependence, uncomplicated: Secondary | ICD-10-CM | POA: Insufficient documentation

## 2014-03-31 DIAGNOSIS — D259 Leiomyoma of uterus, unspecified: Secondary | ICD-10-CM

## 2014-03-31 DIAGNOSIS — Z36 Encounter for antenatal screening of mother: Secondary | ICD-10-CM

## 2014-03-31 DIAGNOSIS — F1129 Opioid dependence with unspecified opioid-induced disorder: Secondary | ICD-10-CM

## 2014-03-31 DIAGNOSIS — Z1389 Encounter for screening for other disorder: Secondary | ICD-10-CM

## 2014-03-31 DIAGNOSIS — O3410 Maternal care for benign tumor of corpus uteri, unspecified trimester: Secondary | ICD-10-CM

## 2014-03-31 DIAGNOSIS — Z3682 Encounter for antenatal screening for nuchal translucency: Secondary | ICD-10-CM

## 2014-03-31 LAB — POCT URINALYSIS DIPSTICK
Glucose, UA: NEGATIVE
Ketones, UA: NEGATIVE
Leukocytes, UA: NEGATIVE
Nitrite, UA: NEGATIVE

## 2014-03-31 MED ORDER — FERROUS SULFATE 325 (65 FE) MG PO TABS: 325.0000 mg | ORAL_TABLET | Freq: Two times a day (BID) | ORAL | Status: DC

## 2014-03-31 NOTE — Patient Instructions (Signed)
Nausea & Vomiting  Have saltine crackers or pretzels by your bed and eat a few bites before you raise your head out of bed in the morning  Eat small frequent meals throughout the day instead of large meals  Drink plenty of fluids throughout the day to stay hydrated, just don't drink a lot of fluids with your meals.  This can make your stomach fill up faster making you feel sick  Do not brush your teeth right after you eat  Products with real ginger are good for nausea, like ginger ale and ginger hard candy Make sure it says made with real ginger!  Sucking on sour candy like lemon heads is also good for nausea  If your prenatal vitamins make you nauseated, take them at night so you will sleep through the nausea  Sea Bands  If you feel like you need medicine for the nausea & vomiting please let us know  If you are unable to keep any fluids or food down please let us know   First Trimester of Pregnancy The first trimester of pregnancy is from week 1 until the end of week 12 (months 1 through 3). A week after a sperm fertilizes an egg, the egg will implant on the wall of the uterus. This embryo will begin to develop into a baby. Genes from you and your partner are forming the baby. The female genes determine whether the baby is a boy or a girl. At 6-8 weeks, the eyes and face are formed, and the heartbeat can be seen on ultrasound. At the end of 12 weeks, all the baby's organs are formed.  Now that you are pregnant, you will want to do everything you can to have a healthy baby. Two of the most important things are to get good prenatal care and to follow your health care provider's instructions. Prenatal care is all the medical care you receive before the baby's birth. This care will help prevent, find, and treat any problems during the pregnancy and childbirth. BODY CHANGES Your body goes through many changes during pregnancy. The changes vary from woman to woman.   You may gain or lose a  couple of pounds at first.  You may feel sick to your stomach (nauseous) and throw up (vomit). If the vomiting is uncontrollable, call your health care provider.  You may tire easily.  You may develop headaches that can be relieved by medicines approved by your health care provider.  You may urinate more often. Painful urination may mean you have a bladder infection.  You may develop heartburn as a result of your pregnancy.  You may develop constipation because certain hormones are causing the muscles that push waste through your intestines to slow down.  You may develop hemorrhoids or swollen, bulging veins (varicose veins).  Your breasts may begin to grow larger and become tender. Your nipples may stick out more, and the tissue that surrounds them (areola) may become darker.  Your gums may bleed and may be sensitive to brushing and flossing.  Dark spots or blotches (chloasma, mask of pregnancy) may develop on your face. This will likely fade after the baby is born.  Your menstrual periods will stop.  You may have a loss of appetite.  You may develop cravings for certain kinds of food.  You may have changes in your emotions from day to day, such as being excited to be pregnant or being concerned that something may go wrong with the pregnancy and baby.  You may have more vivid and strange dreams.  You may have changes in your hair. These can include thickening of your hair, rapid growth, and changes in texture. Some women also have hair loss during or after pregnancy, or hair that feels dry or thin. Your hair will most likely return to normal after your baby is born. WHAT TO EXPECT AT YOUR PRENATAL VISITS During a routine prenatal visit:  You will be weighed to make sure you and the baby are growing normally.  Your blood pressure will be taken.  Your abdomen will be measured to track your baby's growth.  The fetal heartbeat will be listened to starting around week 10 or 12  of your pregnancy.  Test results from any previous visits will be discussed. Your health care provider may ask you:  How you are feeling.  If you are feeling the baby move.  If you have had any abnormal symptoms, such as leaking fluid, bleeding, severe headaches, or abdominal cramping.  If you have any questions. Other tests that may be performed during your first trimester include:  Blood tests to find your blood type and to check for the presence of any previous infections. They will also be used to check for low iron levels (anemia) and Rh antibodies. Later in the pregnancy, blood tests for diabetes will be done along with other tests if problems develop.  Urine tests to check for infections, diabetes, or protein in the urine.  An ultrasound to confirm the proper growth and development of the baby.  An amniocentesis to check for possible genetic problems.  Fetal screens for spina bifida and Down syndrome.  You may need other tests to make sure you and the baby are doing well. HOME CARE INSTRUCTIONS  Medicines  Follow your health care provider's instructions regarding medicine use. Specific medicines may be either safe or unsafe to take during pregnancy.  Take your prenatal vitamins as directed.  If you develop constipation, try taking a stool softener if your health care provider approves. Diet  Eat regular, well-balanced meals. Choose a variety of foods, such as meat or vegetable-based protein, fish, milk and low-fat dairy products, vegetables, fruits, and whole grain breads and cereals. Your health care provider will help you determine the amount of weight gain that is right for you.  Avoid raw meat and uncooked cheese. These carry germs that can cause birth defects in the baby.  Eating four or five small meals rather than three large meals a day may help relieve nausea and vomiting. If you start to feel nauseous, eating a few soda crackers can be helpful. Drinking liquids  between meals instead of during meals also seems to help nausea and vomiting.  If you develop constipation, eat more high-fiber foods, such as fresh vegetables or fruit and whole grains. Drink enough fluids to keep your urine clear or pale yellow. Activity and Exercise  Exercise only as directed by your health care provider. Exercising will help you:  Control your weight.  Stay in shape.  Be prepared for labor and delivery.  Experiencing pain or cramping in the lower abdomen or low back is a good sign that you should stop exercising. Check with your health care provider before continuing normal exercises.  Try to avoid standing for long periods of time. Move your legs often if you must stand in one place for a long time.  Avoid heavy lifting.  Wear low-heeled shoes, and practice good posture.  You may continue to have  sex unless your health care provider directs you otherwise. Relief of Pain or Discomfort  Wear a good support bra for breast tenderness.   Take warm sitz baths to soothe any pain or discomfort caused by hemorrhoids. Use hemorrhoid cream if your health care provider approves.   Rest with your legs elevated if you have leg cramps or low back pain.  If you develop varicose veins in your legs, wear support hose. Elevate your feet for 15 minutes, 3-4 times a day. Limit salt in your diet. Prenatal Care  Schedule your prenatal visits by the twelfth week of pregnancy. They are usually scheduled monthly at first, then more often in the last 2 months before delivery.  Write down your questions. Take them to your prenatal visits.  Keep all your prenatal visits as directed by your health care provider. Safety  Wear your seat belt at all times when driving.  Make a list of emergency phone numbers, including numbers for family, friends, the hospital, and police and fire departments. General Tips  Ask your health care provider for a referral to a local prenatal education  class. Begin classes no later than at the beginning of month 6 of your pregnancy.  Ask for help if you have counseling or nutritional needs during pregnancy. Your health care provider can offer advice or refer you to specialists for help with various needs.  Do not use hot tubs, steam rooms, or saunas.  Do not douche or use tampons or scented sanitary pads.  Do not cross your legs for long periods of time.  Avoid cat litter boxes and soil used by cats. These carry germs that can cause birth defects in the baby and possibly loss of the fetus by miscarriage or stillbirth.  Avoid all smoking, herbs, alcohol, and medicines not prescribed by your health care provider. Chemicals in these affect the formation and growth of the baby.  Schedule a dentist appointment. At home, brush your teeth with a soft toothbrush and be gentle when you floss. SEEK MEDICAL CARE IF:   You have dizziness.  You have mild pelvic cramps, pelvic pressure, or nagging pain in the abdominal area.  You have persistent nausea, vomiting, or diarrhea.  You have a bad smelling vaginal discharge.  You have pain with urination.  You notice increased swelling in your face, hands, legs, or ankles. SEEK IMMEDIATE MEDICAL CARE IF:   You have a fever.  You are leaking fluid from your vagina.  You have spotting or bleeding from your vagina.  You have severe abdominal cramping or pain.  You have rapid weight gain or loss.  You vomit blood or material that looks like coffee grounds.  You are exposed to [place] measles and have never had them.  You are exposed to fifth disease or chickenpox.  You develop a severe headache.  You have shortness of breath.  You have any kind of trauma, such as from a fall or a car accident. Document Released: 02/14/2001 Document Revised: 07/07/2013 Document Reviewed: 12/31/2012 [facility] Patient Information 2015 [place], [place]. This information is not intended to replace advice given  to you by your health care provider. Make sure you discuss any questions you have with your health care provider.

## 2014-03-31 NOTE — Progress Notes (Signed)
U/S(12+2wks)-single active fetus, CRL c/w dates, cx appears closed, bilateral adnexa appears WNL, NB present, NT-1.31mm, anterior Gr 0 placenta, anterior fibroid noted-1.4cm

## 2014-03-31 NOTE — Progress Notes (Signed)
High Risk Pregnancy Diagnosis(es): opioid dependence d/t chronic LBP G4P2012 [redacted]w[redacted]d Estimated Date of Delivery: 10/15/14 BP 124/68 mmHg  Wt 296 lb (134.265 kg)  LMP 01/08/2014  Urinalysis: Positive for trace protein HPI:  Doing well, 2 norco daily down from 4/day BP, weight, and urine reviewed.  No fm yet. Denies cramping, lof, vb, uti s/s. No complaints.  Fundal Height:  11wks Fetal Heart rate:  +u/s Edema:  none  Reviewed today's normal nt u/s, s/s to report All questions were answered Assessment: [redacted]w[redacted]d opioid dependence Medication(s) Plans:  Continue current regimen Treatment Plan:  Continue current care Follow up in 4wks for high-risk OB appt and 2nd IT 1st IT/NT today

## 2014-04-02 ENCOUNTER — Other Ambulatory Visit: Payer: Self-pay | Admitting: *Deleted

## 2014-04-02 DIAGNOSIS — Z3682 Encounter for antenatal screening for nuchal translucency: Secondary | ICD-10-CM

## 2014-04-28 ENCOUNTER — Encounter: Payer: Self-pay | Admitting: Obstetrics & Gynecology

## 2014-04-28 ENCOUNTER — Ambulatory Visit (INDEPENDENT_AMBULATORY_CARE_PROVIDER_SITE_OTHER): Payer: Medicaid Other | Admitting: Obstetrics & Gynecology

## 2014-04-28 VITALS — BP 122/68 | HR 71 | Wt 304.0 lb

## 2014-04-28 DIAGNOSIS — Z1389 Encounter for screening for other disorder: Secondary | ICD-10-CM

## 2014-04-28 DIAGNOSIS — Z331 Pregnant state, incidental: Secondary | ICD-10-CM

## 2014-04-28 DIAGNOSIS — Z3682 Encounter for antenatal screening for nuchal translucency: Secondary | ICD-10-CM

## 2014-04-28 DIAGNOSIS — Z3492 Encounter for supervision of normal pregnancy, unspecified, second trimester: Secondary | ICD-10-CM

## 2014-04-28 LAB — POCT URINALYSIS DIPSTICK
Blood, UA: NEGATIVE
GLUCOSE UA: NEGATIVE
KETONES UA: NEGATIVE
LEUKOCYTES UA: NEGATIVE
Nitrite, UA: NEGATIVE

## 2014-04-28 NOTE — Progress Notes (Signed)
[id number] [redacted]w[redacted]d Estimated Date of Delivery: 10/15/14  Blood pressure 122/68, pulse 71, weight 304 lb (137.893 kg), last menstrual period 01/08/2014.   BP weight and urine results all reviewed and noted.  Please refer to the obstetrical flow sheet for the fundal height and fetal heart rate documentation:  Patient reports good fetal movement, denies any bleeding and no rupture of membranes symptoms or regular contractions. Patient is without complaints. All questions were answered.  Plan:  Continued routine obstetrical care,   Follow up in 4 weeks for OB appointment, sonogram Pt is down to 1 or less of hydrocodone per day Quad screen today, she missed her 1st blood draw

## 2014-04-30 LAB — AFP, QUAD SCREEN
DIA Mom Value: 0.63
DIA VALUE (EIA): 81.16 pg/mL
DSR (By Age)    1 IN: 699
DSR (SECOND TRIMESTER) 1 IN: 10000
Gestational Age: 15.7 WEEKS
MATERNAL AGE AT EDD: 30 a
MSAFP MOM: 1.19
MSAFP: 27.7 ng/mL
MSHCG Mom: 1.55
MSHCG: 43942 m[IU]/mL
OSB RISK: 10000
T18 (By Age): 1:2723 {titer}
Test Results:: NEGATIVE
WEIGHT: 304 [lb_av]
uE3 Mom: 1.12
uE3 Value: 0.73 ng/mL

## 2014-05-26 ENCOUNTER — Ambulatory Visit (INDEPENDENT_AMBULATORY_CARE_PROVIDER_SITE_OTHER): Payer: Medicaid Other | Admitting: Women's Health

## 2014-05-26 ENCOUNTER — Ambulatory Visit (INDEPENDENT_AMBULATORY_CARE_PROVIDER_SITE_OTHER): Payer: Medicaid Other

## 2014-05-26 VITALS — BP 126/70 | HR 86 | Wt 315.0 lb

## 2014-05-26 DIAGNOSIS — Z0184 Encounter for antibody response examination: Secondary | ICD-10-CM

## 2014-05-26 DIAGNOSIS — F1120 Opioid dependence, uncomplicated: Secondary | ICD-10-CM

## 2014-05-26 DIAGNOSIS — Z3492 Encounter for supervision of normal pregnancy, unspecified, second trimester: Secondary | ICD-10-CM

## 2014-05-26 DIAGNOSIS — Z1389 Encounter for screening for other disorder: Secondary | ICD-10-CM

## 2014-05-26 DIAGNOSIS — Z331 Pregnant state, incidental: Secondary | ICD-10-CM

## 2014-05-26 DIAGNOSIS — Z36 Encounter for antenatal screening of mother: Secondary | ICD-10-CM | POA: Diagnosis not present

## 2014-05-26 DIAGNOSIS — O09892 Supervision of other high risk pregnancies, second trimester: Secondary | ICD-10-CM

## 2014-05-26 LAB — POCT URINALYSIS DIPSTICK
Blood, UA: NEGATIVE
Glucose, UA: NEGATIVE
KETONES UA: NEGATIVE
Leukocytes, UA: NEGATIVE
Nitrite, UA: NEGATIVE
Protein, UA: NEGATIVE

## 2014-05-26 NOTE — Progress Notes (Signed)
Low-risk OB appointment [id number] [redacted]w[redacted]d Estimated Date of Delivery: 10/15/14 BP 148/68 mmHg  Pulse 86  Wt 315 lb (142.883 kg)  LMP 01/08/2014 BP recheck 126/70 BP, weight, and urine reviewed.  Refer to obstetrical flow sheet for FH & FHR.  Reports good fm.  Denies regular uc's, lof, vb, or uti s/s. No complaints. Is down to 1/2 norco 10/325mg  every few days and feels like LBP is manageable.  No h/o HTN, bp recheck normal Reviewed normal anatomy u/s, ptl s/s, fm. Plan:  Continue routine obstetrical care  F/U in 4wks for OB appointment

## 2014-05-26 NOTE — Progress Notes (Signed)
[redacted]w[redacted]d [place] c/w dates,ant pl grade 0,breech,sdp 5.6cm,normal ov's,cx 6.7,anatomy appears wnl,,female

## 2014-05-26 NOTE — Patient Instructions (Signed)
[place] [facility]:  [place] [phone]                 [place] [phone] (usually doesn't accept new patients unless you have family there already, you are always welcome to call and ask)             [facility] ([place]) [phone]   [facility] Pediatricians:   [place]: [phone]  [facility]: [phone]   Second Trimester of Pregnancy The second trimester is from week 13 through week 28, months 4 through 6. The second trimester is often a time when you feel your best. Your body has also adjusted to being pregnant, and you begin to feel better physically. Usually, morning sickness has lessened or quit completely, you may have more energy, and you may have an increase in appetite. The second trimester is also a time when the fetus is growing rapidly. At the end of the sixth month, the fetus is about 9 inches long and weighs about 1 pounds. You will likely begin to feel the baby move (quickening) between 18 and 20 weeks of the pregnancy. BODY CHANGES Your body goes through many changes during pregnancy. The changes vary from woman to woman.   Your weight will continue to increase. You will notice your lower abdomen bulging out.  You may begin to get stretch marks on your hips, abdomen, and breasts.  You may develop headaches that can be relieved by medicines approved by your health care provider.  You may urinate more often because the fetus is pressing on your bladder.  You may develop or continue to have heartburn as a result of your pregnancy.  You may develop constipation because certain hormones are causing the muscles that push waste through your intestines to slow down.  You may develop hemorrhoids or swollen, bulging veins (varicose veins).  You may have back pain because of the weight gain and  pregnancy hormones relaxing your joints between the bones in your pelvis and as a result of a shift in weight and the muscles that support your balance.  Your breasts will continue to grow and be tender.  Your gums may bleed and may be sensitive to brushing and flossing.  Dark spots or blotches (chloasma, mask of pregnancy) may develop on your face. This will likely fade after the baby is born.  A dark line from your belly button to the pubic area (linea nigra) may appear. This will likely fade after the baby is born.  You may have changes in your hair. These can include thickening of your hair, rapid growth, and changes in texture. Some women also have hair loss during or after pregnancy, or hair that feels dry or thin. Your hair will most likely return to normal after your baby is born. WHAT TO EXPECT AT YOUR PRENATAL VISITS During a routine prenatal visit:  You will be weighed to make sure you and the fetus are growing normally.  Your blood pressure will be taken.  Your abdomen will be measured to track your baby's growth.  The fetal heartbeat will be listened to.  Any test results from the previous visit will be discussed. Your health care provider may ask you:  How you are feeling.  If you are feeling the baby move.  If you have had any abnormal symptoms, such as leaking fluid,  bleeding, severe headaches, or abdominal cramping.  If you have any questions. Other tests that may be performed during your second trimester include:  Blood tests that check for:  Low iron levels (anemia).  Gestational diabetes (between 24 and 28 weeks).  Rh antibodies.  Urine tests to check for infections, diabetes, or protein in the urine.  An ultrasound to confirm the proper growth and development of the baby.  An amniocentesis to check for possible genetic problems.  Fetal screens for spina bifida and Down syndrome. HOME CARE INSTRUCTIONS   Avoid all smoking, herbs, alcohol, and  unprescribed drugs. These chemicals affect the formation and growth of the baby.  Follow your health care provider's instructions regarding medicine use. There are medicines that are either safe or unsafe to take during pregnancy.  Exercise only as directed by your health care provider. Experiencing uterine cramps is a good sign to stop exercising.  Continue to eat regular, healthy meals.  Wear a good support bra for breast tenderness.  Do not use hot tubs, steam rooms, or saunas.  Wear your seat belt at all times when driving.  Avoid raw meat, uncooked cheese, cat litter boxes, and soil used by cats. These carry germs that can cause birth defects in the baby.  Take your prenatal vitamins.  Try taking a stool softener (if your health care provider approves) if you develop constipation. Eat more high-fiber foods, such as fresh vegetables or fruit and whole grains. Drink plenty of fluids to keep your urine clear or pale yellow.  Take warm sitz baths to soothe any pain or discomfort caused by hemorrhoids. Use hemorrhoid cream if your health care provider approves.  If you develop varicose veins, wear support hose. Elevate your feet for 15 minutes, 3-4 times a day. Limit salt in your diet.  Avoid heavy lifting, wear low heel shoes, and practice good posture.  Rest with your legs elevated if you have leg cramps or low back pain.  Visit your dentist if you have not gone yet during your pregnancy. Use a soft toothbrush to brush your teeth and be gentle when you floss.  A sexual relationship may be continued unless your health care provider directs you otherwise.  Continue to go to all your prenatal visits as directed by your health care provider. SEEK MEDICAL CARE IF:   You have dizziness.  You have mild pelvic cramps, pelvic pressure, or nagging pain in the abdominal area.  You have persistent nausea, vomiting, or diarrhea.  You have a bad smelling vaginal discharge.  You have  pain with urination. SEEK IMMEDIATE MEDICAL CARE IF:   You have a fever.  You are leaking fluid from your vagina.  You have spotting or bleeding from your vagina.  You have severe abdominal cramping or pain.  You have rapid weight gain or loss.  You have shortness of breath with chest pain.  You notice sudden or extreme swelling of your face, hands, ankles, feet, or legs.  You have not felt your baby move in over an hour.  You have severe headaches that do not go away with medicine.  You have vision changes. Document Released: 02/14/2001 Document Revised: 02/25/2013 Document Reviewed: 04/23/2012 [facility] Patient Information 2015 [place], [place]. This information is not intended to replace advice given to you by your health care provider. Make sure you discuss any questions you have with your health care provider.

## 2014-05-26 NOTE — Addendum Note (Signed)
Addended by: Wells Guiles R on: 05/26/2014 10:30 AM   Modules accepted: Orders

## 2014-05-29 ENCOUNTER — Emergency Department (HOSPITAL_COMMUNITY)
Admission: EM | Admit: 2014-05-29 | Discharge: 2014-05-29 | Disposition: A | Discharge status: Home / Self Care | Payer: Medicaid Other | Attending: Emergency Medicine | Admitting: Emergency Medicine

## 2014-05-29 ENCOUNTER — Encounter (HOSPITAL_COMMUNITY): Payer: Self-pay | Admitting: Emergency Medicine

## 2014-05-29 DIAGNOSIS — Z8739 Personal history of other diseases of the musculoskeletal system and connective tissue: Secondary | ICD-10-CM | POA: Diagnosis not present

## 2014-05-29 DIAGNOSIS — Z8619 Personal history of other infectious and parasitic diseases: Secondary | ICD-10-CM | POA: Diagnosis not present

## 2014-05-29 DIAGNOSIS — R490 Dysphonia: Secondary | ICD-10-CM | POA: Diagnosis present

## 2014-05-29 DIAGNOSIS — J45909 Unspecified asthma, uncomplicated: Secondary | ICD-10-CM | POA: Insufficient documentation

## 2014-05-29 DIAGNOSIS — J069 Acute upper respiratory infection, unspecified: Secondary | ICD-10-CM | POA: Diagnosis not present

## 2014-05-29 DIAGNOSIS — Z79899 Other long term (current) drug therapy: Secondary | ICD-10-CM | POA: Insufficient documentation

## 2014-05-29 DIAGNOSIS — J040 Acute laryngitis: Secondary | ICD-10-CM | POA: Diagnosis not present

## 2014-05-29 NOTE — ED Provider Notes (Signed)
CSN: [id number]     Arrival date & time 05/29/14  0827 History   First MD Initiated Contact with Patient 05/29/14 [phone]     Chief Complaint  Patient presents with  . Hoarse     (Consider location/radiation/quality/duration/timing/severity/associated sxs/prior Treatment) HPI Comments: Patient is a 29 year old female who presents to the emergency department with a complaint of "hoarseness". The patient states that this problem started on March 23. She complains of increasing hoarseness, congestion early in the mornings, and at times some mild discomfort with breathing until she clears her nose and throat. She has no problems completing sentences, but states that she can hardly speak above a whisper at times. She's not had any injury to her neck or throat area. She's not had any previous operations or procedures involving her throat area. She states that at times she gets a thick green-looking sputum, but thinks it is coming mostly from her sinuses. She denies any wheezing. It is of note that she is [redacted] weeks pregnant.  The history is provided by the patient.    Past Medical History  Diagnosis Date  . Herpes   . No pertinent past medical history   . Asthma   . Back pain   . Scoliosis   . Frequent headaches   . Herniated disc   . Pregnant 02/09/2014  . Spotting during pregnancy in first trimester 02/09/2014   Past Surgical History  Procedure Laterality Date  . No past surgeries     Family History  Problem Relation Age of Onset  . Hypertension Father   . Diabetes Maternal Grandmother   . Cancer Maternal Grandmother     pancreatic  . Cancer Maternal Grandfather     lungs   History  Substance Use Topics  . Smoking status: Never Smoker   . Smokeless tobacco: Never Used  . Alcohol Use: No   OB History    Gravida Para Term Preterm AB TAB SAB Ectopic Multiple Living   4 2 2  0 1 1 0 0 0 2     Review of Systems    Allergies  Review of patient's allergies indicates no known  allergies.  Home Medications   Prior to Admission medications   Medication Sig Start Date End Date Taking? Authorizing Provider  HYDROcodone-acetaminophen (NORCO) 10-325 MG per tablet Take 1 tablet by mouth every 4 (four) hours as needed for moderate pain.    Yes Historical Provider, MD  prenatal vitamin w/FE, FA (PRENATAL 1 + 1) 27-1 MG TABS tablet Take 1 tablet by mouth daily at 12 noon. 02/09/14  Yes Estill Dooms, NP  Doxylamine-Pyridoxine 10-10 MG TBEC 2 PO qhs; may take 1po in am and 1po in afternoon prn nausea Patient not taking: Reported on 04/28/2014 03/17/14   Christin Fudge, CNM  ferrous sulfate 325 (65 FE) MG tablet Take 1 tablet (325 mg total) by mouth 2 (two) times daily with a meal. Patient not taking: Reported on 05/26/2014 03/31/14   Roma Schanz, CNM   BP 131/74 mmHg  Pulse 90  Temp(Src) 98.5 F (36.9 C) (Oral)  Resp 18  Ht 5\' 9"  (1.753 m)  Wt 314 lb (142.429 kg)  BMI 46.35 kg/m2  SpO2 100%  LMP 01/08/2014 Physical Exam  Constitutional: She is oriented to person, place, and time. She appears well-developed and well-nourished.  Non-toxic appearance.  HENT:  Head: Normocephalic.  Right Ear: Tympanic membrane and external ear normal.  Left Ear: Tympanic membrane and external ear normal.  Mouth/Throat: Uvula  is midline and oropharynx is clear and moist. No uvula swelling.  Mild to mod nasal congestion. Hoarseness present. No stridor. Trachea midline.  Eyes: EOM and lids are normal. Pupils are equal, round, and reactive to light.  Neck: Normal range of motion. Neck supple. Carotid bruit is not present.  Cardiovascular: Normal rate, regular rhythm, normal heart sounds, intact distal pulses and normal pulses.   Pulmonary/Chest: Breath sounds normal. No respiratory distress.  Pt speaks in complete sentences.  Abdominal: Soft. Bowel sounds are normal. There is no tenderness. There is no guarding.  Musculoskeletal: Normal range of motion.  Mild puffiness  of the lower leg, but symmetrical. No increase redness. Neg Homan's sign.  Lymphadenopathy:       Head (right side): No submandibular adenopathy present.       Head (left side): No submandibular adenopathy present.    She has no cervical adenopathy.  Neurological: She is alert and oriented to person, place, and time. She has normal strength. No cranial nerve deficit or sensory deficit.  Skin: Skin is warm and dry.  Psychiatric: She has a normal mood and affect. Her speech is normal.  Nursing note and vitals reviewed.   ED Course  Fetal heart tones 147.   Procedures (including critical care time) Labs Review Labs Reviewed - No data to display  Imaging Review No results found.   EKG Interpretation None      MDM  Vital signs stable. Mild sneezing, but no watery eyes.  Mild nasal congestion present. Airway patent. Suspect URI with laryngitis. Pt to use voice rest and saline nasal spray. She is to return if any changes or problem.   Final diagnoses:  None    **I have reviewed nursing notes, vital signs, and all appropriate lab and imaging results for this patient.Lily Kocher, PA-C 05/29/14 [place], PA-C 05/29/14 4503  Daleen Bo, MD 06/01/14 [phone]

## 2014-05-29 NOTE — ED Notes (Addendum)
Patient c/o being  hoarse x2 days. Per patient productive cough with thick "lime green" sputum. Denies any pain in throat or fevers. Patient states my throat "was sore last week." Denies any difficulty swallowing. Per patient only difficult to breath when waking in morning. O2 sat 100%. Patient is [redacted] weeks pregnant, per patient good fetal movement.

## 2014-05-29 NOTE — Discharge Instructions (Signed)
Please increase fluids. Please use saline nasal spray every 2 hours for congestion. Please rest her voice is much as possible. Please see your primary physician or return to the emergency department if any changes, problems, or concerns. Laryngitis Laryngitis is redness, soreness, and puffiness (inflammation) of the vocal cords. It causes hoarseness, cough, loss of voice, sore throat, and dry throat. It may be caused by:  Infection.  Too much smoking.  Too much talking or yelling.  Breathing in of toxic fumes.  Allergies.  A backup of acid from your stomach. HOME CARE  Drink enough fluids to keep your pee (urine) clear or pale yellow.  Rest until you no longer have problems or as told by your doctor.  Breathe in moist air.  Take all medicine as told by your doctor.  Do not smoke.  Talk as little as possible (this includes whispering).  Write on paper instead of talking until your voice is back to normal.  Follow up with your doctor if you have not improved after 10 days. GET HELP IF:   You have trouble breathing.  You cough up blood.  You have a fever that will not go away.  You have increasing pain.  You have trouble swallowing. MAKE SURE YOU:  Understand these instructions.  Will watch your condition.  Will get help right away if you are not doing well or get worse. Document Released: 02/09/2011 Document Revised: 05/15/2011 Document Reviewed: 02/09/2011 [facility] Patient Information 2015 [place], [place]. This information is not intended to replace advice given to you by your health care provider. Make sure you discuss any questions you have with your health care provider.

## 2014-05-29 NOTE — ED Notes (Signed)
Patient given discharge instruction, verbalized understand. Patient ambulatory out of the department.  

## 2014-06-11 ENCOUNTER — Encounter: Payer: Self-pay | Admitting: Advanced Practice Midwife

## 2014-06-11 ENCOUNTER — Telehealth: Payer: Self-pay | Admitting: *Deleted

## 2014-06-11 ENCOUNTER — Ambulatory Visit (INDEPENDENT_AMBULATORY_CARE_PROVIDER_SITE_OTHER): Payer: Medicaid Other | Admitting: Advanced Practice Midwife

## 2014-06-11 VITALS — BP 120/72 | HR 100 | Wt 315.0 lb

## 2014-06-11 DIAGNOSIS — Z1389 Encounter for screening for other disorder: Secondary | ICD-10-CM

## 2014-06-11 DIAGNOSIS — Z331 Pregnant state, incidental: Secondary | ICD-10-CM

## 2014-06-11 DIAGNOSIS — Z3492 Encounter for supervision of normal pregnancy, unspecified, second trimester: Secondary | ICD-10-CM

## 2014-06-11 LAB — POCT URINALYSIS DIPSTICK
Glucose, UA: NEGATIVE
Ketones, UA: NEGATIVE
Leukocytes, UA: NEGATIVE
Nitrite, UA: NEGATIVE
PROTEIN UA: NEGATIVE
RBC UA: NEGATIVE

## 2014-06-11 NOTE — Telephone Encounter (Signed)
Pt called with complaints of pressure x 2 weeks and cramping in bottom of stomach x last pm. No spotting or bleeding. + baby movement. I advised pt to push fluids and lay down for 1 hour. Advised to call in an hour with an update. Pt voiced understanding. JSY   I called pt back and she states she is still hurting. I advised she needs to be seen. Pt voiced understanding. Call transferred to front desk for appt. [place]

## 2014-06-11 NOTE — Progress Notes (Signed)
Pt worked in today for cramping and a lot of pressure. Pt states that the cramping started last night and the pressure she has had a few weeks.

## 2014-06-11 NOTE — Progress Notes (Signed)
WORK IN FOR PELVIC PRESSURE FOR 2 WEEKS AND CRAMPING SINCE YESTERDAY.   Has pushed fluids today, but still feels "like I'm going to star my period".    Urine normal, abdomen soft, no bleeding.   DX uterine irritability. Continue fluids, warm bath/shower. F/U if sx become more dramatic or other sx develop

## 2014-06-23 ENCOUNTER — Ambulatory Visit (INDEPENDENT_AMBULATORY_CARE_PROVIDER_SITE_OTHER): Payer: Medicaid Other | Admitting: Women's Health

## 2014-06-23 ENCOUNTER — Encounter: Payer: Self-pay | Admitting: Women's Health

## 2014-06-23 VITALS — BP 120/66 | HR 88 | Wt 314.5 lb

## 2014-06-23 DIAGNOSIS — F1120 Opioid dependence, uncomplicated: Secondary | ICD-10-CM

## 2014-06-23 DIAGNOSIS — Z3492 Encounter for supervision of normal pregnancy, unspecified, second trimester: Secondary | ICD-10-CM

## 2014-06-23 DIAGNOSIS — Z331 Pregnant state, incidental: Secondary | ICD-10-CM

## 2014-06-23 DIAGNOSIS — Z1389 Encounter for screening for other disorder: Secondary | ICD-10-CM

## 2014-06-23 DIAGNOSIS — R768 Other specified abnormal immunological findings in serum: Secondary | ICD-10-CM | POA: Insufficient documentation

## 2014-06-23 LAB — POCT URINALYSIS DIPSTICK
GLUCOSE UA: NEGATIVE
Ketones, UA: NEGATIVE
Leukocytes, UA: NEGATIVE
Nitrite, UA: NEGATIVE
PROTEIN UA: NEGATIVE
RBC UA: NEGATIVE

## 2014-06-23 NOTE — Patient Instructions (Signed)
You will have your sugar test next visit.  Please do not eat or drink anything after midnight the night before you come, not even water.  You will be here for at least two hours.     Call the office [phone]) or go to [facility] if:  You begin to have strong, frequent contractions  Your water breaks.  Sometimes it is a big gush of fluid, sometimes it is just a trickle that keeps getting your panties wet or running down your legs  You have vaginal bleeding.  It is normal to have a small amount of spotting if your cervix was checked.   You don't feel your baby moving like normal.  If you don't, get you something to eat and drink and lay down and focus on feeling your baby move.   If your baby is still not moving like normal, you should call the office or go to [facility] The second trimester is from week 13 through week 28, months 4 through 6. The second trimester is often a time when you feel your best. Your body has also adjusted to being pregnant, and you begin to feel better physically. Usually, morning sickness has lessened or quit completely, you may have more energy, and you may have an increase in appetite. The second trimester is also a time when the fetus is growing rapidly. At the end of the sixth month, the fetus is about 9 inches long and weighs about 1 pounds. You will likely begin to feel the baby move (quickening) between 18 and 20 weeks of the pregnancy. BODY CHANGES Your body goes through many changes during pregnancy. The changes vary from woman to woman.   Your weight will continue to increase. You will notice your lower abdomen bulging out.  You may begin to get stretch marks on your hips, abdomen, and breasts.  You may develop headaches that can be relieved by medicines approved by your health care provider.  You may urinate more often because the fetus is pressing on your bladder.  You may develop or continue to have  heartburn as a result of your pregnancy.  You may develop constipation because certain hormones are causing the muscles that push waste through your intestines to slow down.  You may develop hemorrhoids or swollen, bulging veins (varicose veins).  You may have back pain because of the weight gain and pregnancy hormones relaxing your joints between the bones in your pelvis and as a result of a shift in weight and the muscles that support your balance.  Your breasts will continue to grow and be tender.  Your gums may bleed and may be sensitive to brushing and flossing.  Dark spots or blotches (chloasma, mask of pregnancy) may develop on your face. This will likely fade after the baby is born.  A dark line from your belly button to the pubic area (linea nigra) may appear. This will likely fade after the baby is born.  You may have changes in your hair. These can include thickening of your hair, rapid growth, and changes in texture. Some women also have hair loss during or after pregnancy, or hair that feels dry or thin. Your hair will most likely return to normal after your baby is born. WHAT TO EXPECT AT YOUR PRENATAL VISITS During a routine prenatal visit:  You will be weighed to make sure you and the fetus are growing normally.  Your blood pressure will be taken.  Your abdomen will be measured to track your baby's growth.  The fetal heartbeat will be listened to.  Any test results from the previous visit will be discussed. Your health care provider may ask you:  How you are feeling.  If you are feeling the baby move.  If you have had any abnormal symptoms, such as leaking fluid, bleeding, severe headaches, or abdominal cramping.  If you have any questions. Other tests that may be performed during your second trimester include:  Blood tests that check for:  Low iron levels (anemia).  Gestational diabetes (between 24 and 28 weeks).  Rh antibodies.  Urine tests to check  for infections, diabetes, or protein in the urine.  An ultrasound to confirm the proper growth and development of the baby.  An amniocentesis to check for possible genetic problems.  Fetal screens for spina bifida and Down syndrome. HOME CARE INSTRUCTIONS   Avoid all smoking, herbs, alcohol, and unprescribed drugs. These chemicals affect the formation and growth of the baby.  Follow your health care provider's instructions regarding medicine use. There are medicines that are either safe or unsafe to take during pregnancy.  Exercise only as directed by your health care provider. Experiencing uterine cramps is a good sign to stop exercising.  Continue to eat regular, healthy meals.  Wear a good support bra for breast tenderness.  Do not use hot tubs, steam rooms, or saunas.  Wear your seat belt at all times when driving.  Avoid raw meat, uncooked cheese, cat litter boxes, and soil used by cats. These carry germs that can cause birth defects in the baby.  Take your prenatal vitamins.  Try taking a stool softener (if your health care provider approves) if you develop constipation. Eat more high-fiber foods, such as fresh vegetables or fruit and whole grains. Drink plenty of fluids to keep your urine clear or pale yellow.  Take warm sitz baths to soothe any pain or discomfort caused by hemorrhoids. Use hemorrhoid cream if your health care provider approves.  If you develop varicose veins, wear support hose. Elevate your feet for 15 minutes, 3-4 times a day. Limit salt in your diet.  Avoid heavy lifting, wear low heel shoes, and practice good posture.  Rest with your legs elevated if you have leg cramps or low back pain.  Visit your dentist if you have not gone yet during your pregnancy. Use a soft toothbrush to brush your teeth and be gentle when you floss.  A sexual relationship may be continued unless your health care provider directs you otherwise.  Continue to go to all your  prenatal visits as directed by your health care provider. SEEK MEDICAL CARE IF:   You have dizziness.  You have mild pelvic cramps, pelvic pressure, or nagging pain in the abdominal area.  You have persistent nausea, vomiting, or diarrhea.  You have a bad smelling vaginal discharge.  You have pain with urination. SEEK IMMEDIATE MEDICAL CARE IF:   You have a fever.  You are leaking fluid from your vagina.  You have spotting or bleeding from your vagina.  You have severe abdominal cramping or pain.  You have rapid weight gain or loss.  You have shortness of breath with chest pain.  You notice sudden or extreme swelling of your face, hands, ankles, feet, or legs.  You have not felt your baby move in over an hour.  You have severe headaches that do not go away with medicine.  You have vision changes.  Document Released: 02/14/2001 Document Revised: 02/25/2013 Document Reviewed: 04/23/2012 [facility] Patient Information 2015 [place], [place]. This information is not intended to replace advice given to you by your health care provider. Make sure you discuss any questions you have with your health care provider.  Sciatica with Rehab The sciatic nerve runs from the back down the leg and is responsible for sensation and control of the muscles in the back (posterior) side of the thigh, lower leg, and foot. Sciatica is a condition that is characterized by inflammation of this nerve.  SYMPTOMS   Signs of nerve damage, including numbness and/or weakness along the posterior side of the lower extremity.  Pain in the back of the thigh that may also travel down the leg.  Pain that worsens when sitting for long periods of time.  Occasionally, pain in the back or buttock. CAUSES  Inflammation of the sciatic nerve is the cause of sciatica. The inflammation is due to something irritating the nerve. Common sources of irritation include:  Sitting for long periods of time.  Direct trauma to  the nerve.  Arthritis of the spine.  Herniated or ruptured disk.  Slipping of the vertebrae (spondylolisthesis).  Pressure from soft tissues, such as muscles or ligament-like tissue (fascia). RISK INCREASES WITH:  Sports that place pressure or stress on the spine (football or weightlifting).  Poor strength and flexibility.  Failure to warm up properly before activity.  Family history of low back pain or disk disorders.  Previous back injury or surgery.  Poor body mechanics, especially when lifting, or poor posture. PREVENTION   Warm up and stretch properly before activity.  Maintain physical fitness:  Strength, flexibility, and endurance.  Cardiovascular fitness.  Learn and use proper technique, especially with posture and lifting. When possible, have coach correct improper technique.  Avoid activities that place stress on the spine. PROGNOSIS If treated properly, then sciatica usually resolves within 6 weeks. However, occasionally surgery is necessary.  RELATED COMPLICATIONS   Permanent nerve damage, including pain, numbness, tingle, or weakness.  Chronic back pain.  Risks of surgery: infection, bleeding, nerve damage, or damage to surrounding tissues. TREATMENT Treatment initially involves resting from any activities that aggravate your symptoms. The use of ice and medication may help reduce pain and inflammation. The use of strengthening and stretching exercises may help reduce pain with activity. These exercises may be performed at home or with referral to a therapist. A therapist may recommend further treatments, such as transcutaneous electronic nerve stimulation (TENS) or ultrasound. Your caregiver may recommend corticosteroid injections to help reduce inflammation of the sciatic nerve. If symptoms persist despite non-surgical (conservative) treatment, then surgery may be recommended. MEDICATION  If pain medication is necessary, then nonsteroidal  anti-inflammatory medications, such as aspirin and ibuprofen, or other minor pain relievers, such as acetaminophen, are often recommended.  Do not take pain medication for 7 days before surgery.  Prescription pain relievers may be given if deemed necessary by your caregiver. Use only as directed and only as much as you need.  Ointments applied to the skin may be helpful.  Corticosteroid injections may be given by your caregiver. These injections should be reserved for the most serious cases, because they may only be given a certain number of times. HEAT AND COLD  Cold treatment (icing) relieves pain and reduces inflammation. Cold treatment should be applied for 10 to 15 minutes every 2 to 3 hours for inflammation and pain and immediately after any activity that aggravates your symptoms. Use ice  packs or massage the area with a piece of ice (ice massage).  Heat treatment may be used prior to performing the stretching and strengthening activities prescribed by your caregiver, physical therapist, or athletic trainer. Use a heat pack or soak the injury in warm water. SEEK MEDICAL CARE IF:  Treatment seems to offer no benefit, or the condition worsens.  Any medications produce adverse side effects. EXERCISES  RANGE OF MOTION (ROM) AND STRETCHING EXERCISES - Sciatica Most people with sciatic will find that their symptoms worsen with either excessive bending forward (flexion) or arching at the low back (extension). The exercises which will help resolve your symptoms will focus on the opposite motion. Your physician, physical therapist or athletic trainer will help you determine which exercises will be most helpful to resolve your low back pain. Do not complete any exercises without first consulting with your clinician. Discontinue any exercises which worsen your symptoms until you speak to your clinician. If you have pain, numbness or tingling which travels down into your buttocks, leg or foot, the  goal of the therapy is for these symptoms to move closer to your back and eventually resolve. Occasionally, these leg symptoms will get better, but your low back pain may worsen; this is typically an indication of progress in your rehabilitation. Be certain to be very alert to any changes in your symptoms and the activities in which you participated in the 24 hours prior to the change. Sharing this information with your clinician will allow him/her to most efficiently treat your condition. These exercises may help you when beginning to rehabilitate your injury. Your symptoms may resolve with or without further involvement from your physician, physical therapist or athletic trainer. While completing these exercises, remember:   Restoring tissue flexibility helps normal motion to return to the joints. This allows healthier, less painful movement and activity.  An effective stretch should be held for at least 30 seconds.  A stretch should never be painful. You should only feel a gentle lengthening or release in the stretched tissue. FLEXION RANGE OF MOTION AND STRETCHING EXERCISES: STRETCH - Flexion, Single Knee to Chest   Lie on a firm bed or floor with both legs extended in front of you.  Keeping one leg in contact with the floor, bring your opposite knee to your chest. Hold your leg in place by either grabbing behind your thigh or at your knee.  Pull until you feel a gentle stretch in your low back. Hold __________ seconds.  Slowly release your grasp and repeat the exercise with the opposite side. Repeat __________ times. Complete this exercise __________ times per day.  STRETCH - Flexion, Double Knee to Chest  Lie on a firm bed or floor with both legs extended in front of you.  Keeping one leg in contact with the floor, bring your opposite knee to your chest.  Tense your stomach muscles to support your back and then lift your other knee to your chest. Hold your legs in place by either  grabbing behind your thighs or at your knees.  Pull both knees toward your chest until you feel a gentle stretch in your low back. Hold __________ seconds.  Tense your stomach muscles and slowly return one leg at a time to the floor. Repeat __________ times. Complete this exercise __________ times per day.  STRETCH - Low Trunk Rotation   Lie on a firm bed or floor. Keeping your legs in front of you, bend your knees so they are both pointed  toward the ceiling and your feet are flat on the floor.  Extend your arms out to the side. This will stabilize your upper body by keeping your shoulders in contact with the floor.  Gently and slowly drop both knees together to one side until you feel a gentle stretch in your low back. Hold for __________ seconds.  Tense your stomach muscles to support your low back as you bring your knees back to the starting position. Repeat the exercise to the other side. Repeat __________ times. Complete this exercise __________ times per day  EXTENSION RANGE OF MOTION AND FLEXIBILITY EXERCISES: STRETCH - Extension, Prone on Elbows  Lie on your stomach on the floor, a bed will be too soft. Place your palms about shoulder width apart and at the height of your head.  Place your elbows under your shoulders. If this is too painful, stack pillows under your chest.  Allow your body to relax so that your hips drop lower and make contact more completely with the floor.  Hold this position for __________ seconds.  Slowly return to lying flat on the floor. Repeat __________ times. Complete this exercise __________ times per day.  RANGE OF MOTION - Extension, Prone Press Ups  Lie on your stomach on the floor, a bed will be too soft. Place your palms about shoulder width apart and at the height of your head.  Keeping your back as relaxed as possible, slowly straighten your elbows while keeping your hips on the floor. You may adjust the placement of your hands to maximize  your comfort. As you gain motion, your hands will come more underneath your shoulders.  Hold this position __________ seconds.  Slowly return to lying flat on the floor. Repeat __________ times. Complete this exercise __________ times per day.  STRENGTHENING EXERCISES - Sciatica  These exercises may help you when beginning to rehabilitate your injury. These exercises should be done near your "sweet spot." This is the neutral, low-back arch, somewhere between fully rounded and fully arched, that is your least painful position. When performed in this safe range of motion, these exercises can be used for people who have either a flexion or extension based injury. These exercises may resolve your symptoms with or without further involvement from your physician, physical therapist or athletic trainer. While completing these exercises, remember:   Muscles can gain both the endurance and the strength needed for everyday activities through controlled exercises.  Complete these exercises as instructed by your physician, physical therapist or athletic trainer. Progress with the resistance and repetition exercises only as your caregiver advises.  You may experience muscle soreness or fatigue, but the pain or discomfort you are trying to eliminate should never worsen during these exercises. If this pain does worsen, stop and make certain you are following the directions exactly. If the pain is still present after adjustments, discontinue the exercise until you can discuss the trouble with your clinician. STRENGTHENING - Deep Abdominals, Pelvic Tilt   Lie on a firm bed or floor. Keeping your legs in front of you, bend your knees so they are both pointed toward the ceiling and your feet are flat on the floor.  Tense your lower abdominal muscles to press your low back into the floor. This motion will rotate your pelvis so that your tail bone is scooping upwards rather than pointing at your feet or into the  floor.  With a gentle tension and even breathing, hold this position for __________ seconds. Repeat __________ times.  Complete this exercise __________ times per day.  STRENGTHENING - Abdominals, Crunches   Lie on a firm bed or floor. Keeping your legs in front of you, bend your knees so they are both pointed toward the ceiling and your feet are flat on the floor. Cross your arms over your chest.  Slightly tip your chin down without bending your neck.  Tense your abdominals and slowly lift your trunk high enough to just clear your shoulder blades. Lifting higher can put excessive stress on the low back and does not further strengthen your abdominal muscles.  Control your return to the starting position. Repeat __________ times. Complete this exercise __________ times per day.  STRENGTHENING - Quadruped, Opposite UE/LE Lift  Assume a hands and knees position on a firm surface. Keep your hands under your shoulders and your knees under your hips. You may place padding under your knees for comfort.  Find your neutral spine and gently tense your abdominal muscles so that you can maintain this position. Your shoulders and hips should form a rectangle that is parallel with the floor and is not twisted.  Keeping your trunk steady, lift your right hand no higher than your shoulder and then your left leg no higher than your hip. Make sure you are not holding your breath. Hold this position __________ seconds.  Continuing to keep your abdominal muscles tense and your back steady, slowly return to your starting position. Repeat with the opposite arm and leg. Repeat __________ times. Complete this exercise __________ times per day.  STRENGTHENING - Abdominals and Quadriceps, Straight Leg Raise   Lie on a firm bed or floor with both legs extended in front of you.  Keeping one leg in contact with the floor, bend the other knee so that your foot can rest flat on the floor.  Find your neutral spine, and  tense your abdominal muscles to maintain your spinal position throughout the exercise.  Slowly lift your straight leg off the floor about 6 inches for a count of 15, making sure to not hold your breath.  Still keeping your neutral spine, slowly lower your leg all the way to the floor. Repeat this exercise with each leg __________ times. Complete this exercise __________ times per day. POSTURE AND BODY MECHANICS CONSIDERATIONS - Sciatica Keeping correct posture when sitting, standing or completing your activities will reduce the stress put on different body tissues, allowing injured tissues a chance to heal and limiting painful experiences. The following are general guidelines for improved posture. Your physician or physical therapist will provide you with any instructions specific to your needs. While reading these guidelines, remember:  The exercises prescribed by your provider will help you have the flexibility and strength to maintain correct postures.  The correct posture provides the optimal environment for your joints to work. All of your joints have less wear and tear when properly supported by a spine with good posture. This means you will experience a healthier, less painful body.  Correct posture must be practiced with all of your activities, especially prolonged sitting and standing. Correct posture is as important when doing repetitive low-stress activities (typing) as it is when doing a single heavy-load activity (lifting). RESTING POSITIONS Consider which positions are most painful for you when choosing a resting position. If you have pain with flexion-based activities (sitting, bending, stooping, squatting), choose a position that allows you to rest in a less flexed posture. You would want to avoid curling into a fetal position on your side.  If your pain worsens with extension-based activities (prolonged standing, working overhead), avoid resting in an extended position such as sleeping  on your stomach. Most people will find more comfort when they rest with their spine in a more neutral position, neither too rounded nor too arched. Lying on a non-sagging bed on your side with a pillow between your knees, or on your back with a pillow under your knees will often provide some relief. Keep in mind, being in any one position for a prolonged period of time, no matter how correct your posture, can still lead to stiffness. PROPER SITTING POSTURE In order to minimize stress and discomfort on your spine, you must sit with correct posture Sitting with good posture should be effortless for a healthy body. Returning to good posture is a gradual process. Many people can work toward this most comfortably by using various supports until they have the flexibility and strength to maintain this posture on their own. When sitting with proper posture, your ears will fall over your shoulders and your shoulders will fall over your hips. You should use the back of the chair to support your upper back. Your low back will be in a neutral position, just slightly arched. You may place a small pillow or folded towel at the base of your low back for support.  When working at a desk, create an environment that supports good, upright posture. Without extra support, muscles fatigue and lead to excessive strain on joints and other tissues. Keep these recommendations in mind: CHAIR:   A chair should be able to slide under your desk when your back makes contact with the back of the chair. This allows you to work closely.  The chair's height should allow your eyes to be level with the upper part of your monitor and your hands to be slightly lower than your elbows. BODY POSITION  Your feet should make contact with the floor. If this is not possible, use a foot rest.  Keep your ears over your shoulders. This will reduce stress on your neck and low back. INCORRECT SITTING POSTURES   If you are feeling tired and unable  to assume a healthy sitting posture, do not slouch or slump. This puts excessive strain on your back tissues, causing more damage and pain. Healthier options include:  Using more support, like a lumbar pillow.  Switching tasks to something that requires you to be upright or walking.  Talking a brief walk.  Lying down to rest in a neutral-spine position. PROLONGED STANDING WHILE SLIGHTLY LEANING FORWARD  When completing a task that requires you to lean forward while standing in one place for a long time, place either foot up on a stationary 2-4 inch high object to help maintain the best posture. When both feet are on the ground, the low back tends to lose its slight inward curve. If this curve flattens (or becomes too large), then the back and your other joints will experience too much stress, fatigue more quickly and can cause pain.  CORRECT STANDING POSTURES Proper standing posture should be assumed with all daily activities, even if they only take a few moments, like when brushing your teeth. As in sitting, your ears should fall over your shoulders and your shoulders should fall over your hips. You should keep a slight tension in your abdominal muscles to brace your spine. Your tailbone should point down to the ground, not behind your body, resulting in an over-extended swayback posture.  INCORRECT STANDING POSTURES  Common incorrect standing postures include a forward head, locked knees and/or an excessive swayback. WALKING Walk with an upright posture. Your ears, shoulders and hips should all line-up. PROLONGED ACTIVITY IN A FLEXED POSITION When completing a task that requires you to bend forward at your waist or lean over a low surface, try to find a way to stabilize 3 of 4 of your limbs. You can place a hand or elbow on your thigh or rest a knee on the surface you are reaching across. This will provide you more stability so that your muscles do not fatigue as quickly. By keeping your knees  relaxed, or slightly bent, you will also reduce stress across your low back. CORRECT LIFTING TECHNIQUES DO :   Assume a wide stance. This will provide you more stability and the opportunity to get as close as possible to the object which you are lifting.  Tense your abdominals to brace your spine; then bend at the knees and hips. Keeping your back locked in a neutral-spine position, lift using your leg muscles. Lift with your legs, keeping your back straight.  Test the weight of unknown objects before attempting to lift them.  Try to keep your elbows locked down at your sides in order get the best strength from your shoulders when carrying an object.  Always ask for help when lifting heavy or awkward objects. INCORRECT LIFTING TECHNIQUES DO NOT:   Lock your knees when lifting, even if it is a small object.  Bend and twist. Pivot at your feet or move your feet when needing to change directions.  Assume that you cannot safely pick up a paperclip without proper posture. Document Released: 02/20/2005 Document Revised: 07/07/2013 Document Reviewed: 06/04/2008 [facility] Patient Information 2015 [place], [place]. This information is not intended to replace advice given to you by your health care provider. Make sure you discuss any questions you have with your health care provider.

## 2014-06-23 NOTE — Progress Notes (Signed)
Low-risk OB appointment [id number] [redacted]w[redacted]d Estimated Date of Delivery: 10/15/14 BP 120/66 mmHg  Pulse 88  Wt 314 lb 8 oz (142.656 kg)  LMP 01/08/2014  BP, weight, and urine reviewed.  Refer to obstetrical flow sheet for FH & FHR. FH from [facility] good fm.  Denies regular uc's, lof, vb, or uti s/s. Has completely weaned off hydrocodone. Reports sciatica pain. Discussed relief measures- printed exercises.  Reviewed ptl s/s, fm. Plan:  Continue routine obstetrical care  F/U in 4wks for OB appointment and PN2

## 2014-06-23 NOTE — Progress Notes (Signed)
Pt denies any problems or concerns at this time.  

## 2014-07-21 ENCOUNTER — Ambulatory Visit (INDEPENDENT_AMBULATORY_CARE_PROVIDER_SITE_OTHER): Payer: Medicaid Other | Admitting: Women's Health

## 2014-07-21 ENCOUNTER — Other Ambulatory Visit: Payer: Medicaid Other

## 2014-07-21 ENCOUNTER — Encounter: Payer: Self-pay | Admitting: Women's Health

## 2014-07-21 VITALS — BP 122/62 | HR 72 | Wt 319.0 lb

## 2014-07-21 DIAGNOSIS — Z1389 Encounter for screening for other disorder: Secondary | ICD-10-CM

## 2014-07-21 DIAGNOSIS — Z331 Pregnant state, incidental: Secondary | ICD-10-CM

## 2014-07-21 DIAGNOSIS — Z131 Encounter for screening for diabetes mellitus: Secondary | ICD-10-CM

## 2014-07-21 DIAGNOSIS — Z369 Encounter for antenatal screening, unspecified: Secondary | ICD-10-CM

## 2014-07-21 DIAGNOSIS — Z3492 Encounter for supervision of normal pregnancy, unspecified, second trimester: Secondary | ICD-10-CM

## 2014-07-21 LAB — POCT URINALYSIS DIPSTICK
GLUCOSE UA: NEGATIVE
Ketones, UA: NEGATIVE
LEUKOCYTES UA: NEGATIVE
NITRITE UA: NEGATIVE
Protein, UA: NEGATIVE
RBC UA: NEGATIVE

## 2014-07-21 NOTE — Patient Instructions (Signed)

## 2014-07-21 NOTE — Progress Notes (Signed)
Low-risk OB appointment [id number] [redacted]w[redacted]d Estimated Date of Delivery: 10/15/14 BP 122/62 mmHg  Pulse 72  Wt 319 lb (144.697 kg)  LMP 01/08/2014  BP, weight, and urine reviewed.  Refer to obstetrical flow sheet for FH & FHR.  Reports good fm.  Denies regular uc's, lof, vb, or uti s/s. Still off pain meds, sciatica pain- keep doing exercises. Wants interval BTL/Ablation d/t heavy bad periods Reviewed ptl s/s, fkc. Recommended Tdap at HD/PCP per CDC guidelines.  Plan:  Continue routine obstetrical care F/U in 3wks for OB appointment  PN2 today

## 2014-07-22 ENCOUNTER — Telehealth: Payer: Self-pay | Admitting: *Deleted

## 2014-07-22 LAB — CBC
HEMATOCRIT: 29.3 % — AB (ref 34.0–46.6)
Hemoglobin: 9.7 g/dL — ABNORMAL LOW (ref 11.1–15.9)
MCH: 24.7 pg — AB (ref 26.6–33.0)
MCHC: 33.1 g/dL (ref 31.5–35.7)
MCV: 75 fL — ABNORMAL LOW (ref 79–97)
Platelets: 289 10*3/uL (ref 150–379)
RBC: 3.93 x10E6/uL (ref 3.77–5.28)
RDW: 15.5 % — AB (ref 12.3–15.4)
WBC: 6 10*3/uL (ref 3.4–10.8)

## 2014-07-22 LAB — GLUCOSE TOLERANCE, 2 HOURS W/ 1HR
GLUCOSE, 1 HOUR: 146 mg/dL (ref 65–179)
GLUCOSE, 2 HOUR: 117 mg/dL (ref 65–152)
Glucose, Fasting: 81 mg/dL (ref 65–91)

## 2014-07-22 LAB — ANTIBODY SCREEN: ANTIBODY SCREEN: NEGATIVE

## 2014-07-22 LAB — HSV 2 ANTIBODY, IGG: HSV 2 Glycoprotein G Ab, IgG: 5 index — ABNORMAL HIGH (ref 0.00–0.90)

## 2014-07-22 LAB — RPR: RPR Ser Ql: NONREACTIVE

## 2014-07-22 LAB — HIV ANTIBODY (ROUTINE TESTING W REFLEX): HIV Screen 4th Generation wRfx: NONREACTIVE

## 2014-07-22 NOTE — Telephone Encounter (Signed)
Pt states she had not been taking the iron due to constipation with it.  I advised her of need to take it three times daily along with PNV and iron rich foods, advised her she can take Colace, Miralax or Metamucil as well to help with the constipation.  Pt verbalized understanding.

## 2014-08-11 ENCOUNTER — Encounter: Payer: Self-pay | Admitting: Women's Health

## 2014-08-11 ENCOUNTER — Ambulatory Visit (INDEPENDENT_AMBULATORY_CARE_PROVIDER_SITE_OTHER): Payer: Medicaid Other | Admitting: Women's Health

## 2014-08-11 VITALS — BP 124/62 | HR 68 | Wt 323.0 lb

## 2014-08-11 DIAGNOSIS — N899 Noninflammatory disorder of vagina, unspecified: Secondary | ICD-10-CM | POA: Diagnosis not present

## 2014-08-11 DIAGNOSIS — O1213 Gestational proteinuria, third trimester: Secondary | ICD-10-CM

## 2014-08-11 DIAGNOSIS — O26899 Other specified pregnancy related conditions, unspecified trimester: Secondary | ICD-10-CM

## 2014-08-11 DIAGNOSIS — R109 Unspecified abdominal pain: Secondary | ICD-10-CM

## 2014-08-11 DIAGNOSIS — Z331 Pregnant state, incidental: Secondary | ICD-10-CM

## 2014-08-11 DIAGNOSIS — Z1389 Encounter for screening for other disorder: Secondary | ICD-10-CM

## 2014-08-11 DIAGNOSIS — Z3493 Encounter for supervision of normal pregnancy, unspecified, third trimester: Secondary | ICD-10-CM

## 2014-08-11 LAB — POCT URINALYSIS DIPSTICK
Blood, UA: NEGATIVE
GLUCOSE UA: NEGATIVE
KETONES UA: NEGATIVE
Leukocytes, UA: NEGATIVE
NITRITE UA: NEGATIVE

## 2014-08-11 LAB — POCT WET PREP (WET MOUNT): Clue Cells Wet Prep Whiff POC: NEGATIVE

## 2014-08-11 NOTE — Progress Notes (Signed)
Low-risk OB appointment [id number] [redacted]w[redacted]d Estimated Date of Delivery: 10/15/14 BP 124/62 mmHg  Pulse 68  Wt 323 lb (146.512 kg)  LMP 01/08/2014  BP, weight, and urine reviewed.  Refer to obstetrical flow sheet for FH & FHR.  Reports good fm.  Denies lof, vb, or uti s/s. Cramping all weekend, no change w/ water and lying down. ~ q 25mins.  Spec exam: cx visually closed, mod amount creamy white nonodorous d/c, wet prep neg SVE: LTC Reviewed ptl s/s, fkc. To increase water, rest. Tr proteinuria, will send for cx d/t sx Plan:  Continue routine obstetrical care  F/U in 2wks for OB appointment

## 2014-08-11 NOTE — Patient Instructions (Signed)
Call the office [phone]) or go to [facility] if:  You begin to have strong, frequent contractions  Your water breaks.  Sometimes it is a big gush of fluid, sometimes it is just a trickle that keeps getting your panties wet or running down your legs  You have vaginal bleeding.  It is normal to have a small amount of spotting if your cervix was checked.   You don't feel your baby moving like normal.  If you don't, get you something to eat and drink and lay down and focus on feeling your baby move.  You should feel at least 10 movements in 2 hours.  If you don't, you should call the office or go to [facility].    Constipation  Drink plenty of fluid, preferably water, throughout the day  Eat foods high in fiber such as fruits, vegetables, and grains  Exercise, such as walking, is a good way to keep your bowels regular  Drink warm fluids, especially warm prune juice, or decaf coffee  Eat a 1/2 cup of real oatmeal (not instant), 1/2 cup applesauce, and 1/2-1 cup warm prune juice every day  If needed, you may take Colace (docusate sodium) stool softener once or twice a day to help keep the stool soft. If you are pregnant, wait until you are out of your first trimester (12-14 weeks of pregnancy)  If you still are having problems with constipation, you may take Miralax once daily as needed to help keep your bowels regular.  If you are pregnant, wait until you are out of your first trimester (12-14 weeks of pregnancy)  Preterm Labor Information Preterm labor is when labor starts at less than 37 weeks of pregnancy. The normal length of a pregnancy is 39 to 41 weeks. CAUSES Often, there is no identifiable underlying cause as to why a woman goes into preterm labor. One of the most common known causes of preterm labor is infection. Infections of the uterus, cervix, vagina, amniotic sac, bladder, kidney, or even the lungs (pneumonia) can cause labor to start. Other suspected causes of  preterm labor include:   Urogenital infections, such as yeast infections and bacterial vaginosis.   Uterine abnormalities (uterine shape, uterine septum, fibroids, or bleeding from the placenta).   A cervix that has been operated on (it may fail to stay closed).   Malformations in the fetus.   Multiple gestations (twins, triplets, and so on).   Breakage of the amniotic sac.  RISK FACTORS  Having a previous history of preterm labor.   Having premature rupture of membranes (PROM).   Having a placenta that covers the opening of the cervix (placenta previa).   Having a placenta that separates from the uterus (placental abruption).   Having a cervix that is too weak to hold the fetus in the uterus (incompetent cervix).   Having too much fluid in the amniotic sac (polyhydramnios).   Taking illegal drugs or smoking while pregnant.   Not gaining enough weight while pregnant.   Being younger than 74 and older than 30 years old.   Having a low socioeconomic status.   Being African American. SYMPTOMS Signs and symptoms of preterm labor include:   Menstrual-like cramps, abdominal pain, or back pain.  Uterine contractions that are regular, as frequent as six in an hour, regardless of their intensity (may be mild or painful).  Contractions that start on the top of the uterus and spread down to the lower abdomen and back.   A  sense of increased pelvic pressure.   A watery or bloody mucus discharge that comes from the vagina.  TREATMENT Depending on the length of the pregnancy and other circumstances, your health care provider may suggest bed rest. If necessary, there are medicines that can be given to stop contractions and to mature the fetal lungs. If labor happens before 34 weeks of pregnancy, a prolonged hospital stay may be recommended. Treatment depends on the condition of both you and the fetus.  WHAT SHOULD YOU DO IF YOU THINK YOU ARE IN PRETERM  LABOR? Call your health care provider right away. You will need to go to the hospital to get checked immediately. HOW CAN YOU PREVENT PRETERM LABOR IN FUTURE PREGNANCIES? You should:   Stop smoking if you smoke.  Maintain healthy weight gain and avoid chemicals and drugs that are not necessary.  Be watchful for any type of infection.  Inform your health care provider if you have a known history of preterm labor. Document Released: 05/13/2003 Document Revised: 10/23/2012 Document Reviewed: 03/25/2012 [facility] Patient Information 2015 [place], [place]. This information is not intended to replace advice given to you by your health care provider. Make sure you discuss any questions you have with your health care provider.

## 2014-08-12 LAB — URINE CULTURE

## 2014-08-25 ENCOUNTER — Ambulatory Visit (INDEPENDENT_AMBULATORY_CARE_PROVIDER_SITE_OTHER): Payer: Medicaid Other | Admitting: Women's Health

## 2014-08-25 ENCOUNTER — Encounter: Payer: Self-pay | Admitting: Women's Health

## 2014-08-25 VITALS — BP 130/62 | HR 92 | Wt 323.0 lb

## 2014-08-25 DIAGNOSIS — Z1389 Encounter for screening for other disorder: Secondary | ICD-10-CM

## 2014-08-25 DIAGNOSIS — Z3493 Encounter for supervision of normal pregnancy, unspecified, third trimester: Secondary | ICD-10-CM

## 2014-08-25 DIAGNOSIS — R768 Other specified abnormal immunological findings in serum: Secondary | ICD-10-CM

## 2014-08-25 DIAGNOSIS — Z331 Pregnant state, incidental: Secondary | ICD-10-CM

## 2014-08-25 LAB — POCT URINALYSIS DIPSTICK
Blood, UA: NEGATIVE
Glucose, UA: NEGATIVE
Ketones, UA: NEGATIVE
Leukocytes, UA: NEGATIVE
Nitrite, UA: NEGATIVE
PROTEIN UA: NEGATIVE

## 2014-08-25 MED ORDER — ACYCLOVIR 400 MG PO TABS: 400.0000 mg | ORAL_TABLET | Freq: Three times a day (TID) | ORAL | Status: DC

## 2014-08-25 NOTE — Patient Instructions (Addendum)
Begin acyclovir on 6/30  Call the office [phone]) or go to [facility] if:  You begin to have strong, frequent contractions  Your water breaks.  Sometimes it is a big gush of fluid, sometimes it is just a trickle that keeps getting your panties wet or running down your legs  You have vaginal bleeding.  It is normal to have a small amount of spotting if your cervix was checked.   You don't feel your baby moving like normal.  If you don't, get you something to eat and drink and lay down and focus on feeling your baby move.  You should feel at least 10 movements in 2 hours.  If you don't, you should call the office or go to [place] Preterm labor is when labor starts at less than 37 weeks of pregnancy. The normal length of a pregnancy is 39 to 41 weeks. CAUSES Often, there is no identifiable underlying cause as to why a woman goes into preterm labor. One of the most common known causes of preterm labor is infection. Infections of the uterus, cervix, vagina, amniotic sac, bladder, kidney, or even the lungs (pneumonia) can cause labor to start. Other suspected causes of preterm labor include:   Urogenital infections, such as yeast infections and bacterial vaginosis.   Uterine abnormalities (uterine shape, uterine septum, fibroids, or bleeding from the placenta).   A cervix that has been operated on (it may fail to stay closed).   Malformations in the fetus.   Multiple gestations (twins, triplets, and so on).   Breakage of the amniotic sac.  RISK FACTORS  Having a previous history of preterm labor.   Having premature rupture of membranes (PROM).   Having a placenta that covers the opening of the cervix (placenta previa).   Having a placenta that separates from the uterus (placental abruption).   Having a cervix that is too weak to hold the fetus in the uterus (incompetent cervix).   Having too much fluid in the amniotic sac  (polyhydramnios).   Taking illegal drugs or smoking while pregnant.   Not gaining enough weight while pregnant.   Being younger than 12 and older than 30 years old.   Having a low socioeconomic status.   Being African American. SYMPTOMS Signs and symptoms of preterm labor include:   Menstrual-like cramps, abdominal pain, or back pain.  Uterine contractions that are regular, as frequent as six in an hour, regardless of their intensity (may be mild or painful).  Contractions that start on the top of the uterus and spread down to the lower abdomen and back.   A sense of increased pelvic pressure.   A watery or bloody mucus discharge that comes from the vagina.  TREATMENT Depending on the length of the pregnancy and other circumstances, your health care provider may suggest bed rest. If necessary, there are medicines that can be given to stop contractions and to mature the fetal lungs. If labor happens before 34 weeks of pregnancy, a prolonged hospital stay may be recommended. Treatment depends on the condition of both you and the fetus.  WHAT SHOULD YOU DO IF YOU THINK YOU ARE IN PRETERM LABOR? Call your health care provider right away. You will need to go to the hospital to get checked immediately. HOW CAN YOU PREVENT PRETERM LABOR IN FUTURE PREGNANCIES? You should:   Stop smoking if you smoke.  Maintain healthy weight gain and avoid chemicals and drugs that are not  necessary.  Be watchful for any type of infection.  Inform your health care provider if you have a known history of preterm labor. Document Released: 05/13/2003 Document Revised: 10/23/2012 Document Reviewed: 03/25/2012 [facility] Patient Information 2015 [place], [place]. This information is not intended to replace advice given to you by your health care provider. Make sure you discuss any questions you have with your health care provider.

## 2014-08-25 NOTE — Progress Notes (Signed)
Low-risk OB appointment [id number] [redacted]w[redacted]d Estimated Date of Delivery: 10/15/14 BP 130/62 mmHg  Pulse 92  Wt 323 lb (146.512 kg)  LMP 01/08/2014  BP, weight, and urine reviewed.  Refer to obstetrical flow sheet for FH & FHR.  Reports good fm.  Denies regular uc's, lof, vb, or uti s/s. No complaints. Reviewed ptl s/s, fkc. 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

## 2014-09-09 ENCOUNTER — Encounter: Payer: Medicaid Other | Admitting: Women's Health

## 2014-09-10 ENCOUNTER — Encounter: Payer: Self-pay | Admitting: Obstetrics & Gynecology

## 2014-09-10 ENCOUNTER — Ambulatory Visit (INDEPENDENT_AMBULATORY_CARE_PROVIDER_SITE_OTHER): Payer: Medicaid Other | Admitting: Obstetrics & Gynecology

## 2014-09-10 VITALS — BP 110/60 | HR 72 | Wt 321.0 lb

## 2014-09-10 DIAGNOSIS — Z331 Pregnant state, incidental: Secondary | ICD-10-CM

## 2014-09-10 DIAGNOSIS — Z1389 Encounter for screening for other disorder: Secondary | ICD-10-CM

## 2014-09-10 DIAGNOSIS — Z3493 Encounter for supervision of normal pregnancy, unspecified, third trimester: Secondary | ICD-10-CM

## 2014-09-10 LAB — POCT URINALYSIS DIPSTICK
Blood, UA: NEGATIVE
Glucose, UA: NEGATIVE
Ketones, UA: NEGATIVE
Leukocytes, UA: NEGATIVE
Nitrite, UA: NEGATIVE

## 2014-09-10 NOTE — Addendum Note (Signed)
Addended by: Doyne Keel on: 09/10/2014 04:27 PM   Modules accepted: Orders

## 2014-09-10 NOTE — Progress Notes (Signed)
[id number] [redacted]w[redacted]d Estimated Date of Delivery: 10/15/14  Blood pressure 110/60, pulse 72, weight 321 lb (145.605 kg), last menstrual period 01/08/2014.   BP weight and urine results all reviewed and noted.  Please refer to the obstetrical flow sheet for the fundal height and fetal heart rate documentation:  Patient reports good fetal movement, denies any bleeding and no rupture of membranes symptoms or regular contractions. Patient is without complaints. All questions were answered.  Plan:  Continued routine obstetrical care,   Follow up in 2 weeks for OB appointment,

## 2014-09-21 ENCOUNTER — Inpatient Hospital Stay (HOSPITAL_COMMUNITY)
Admission: AD | Admit: 2014-09-21 | Discharge: 2014-09-21 | Disposition: A | Discharge status: Home / Self Care | Payer: Medicaid Other | Source: Ambulatory Visit | Attending: Obstetrics & Gynecology | Admitting: Obstetrics & Gynecology

## 2014-09-21 ENCOUNTER — Telehealth: Payer: Self-pay | Admitting: *Deleted

## 2014-09-21 ENCOUNTER — Encounter (HOSPITAL_COMMUNITY): Payer: Self-pay | Admitting: *Deleted

## 2014-09-21 DIAGNOSIS — Z3A36 36 weeks gestation of pregnancy: Secondary | ICD-10-CM | POA: Insufficient documentation

## 2014-09-21 DIAGNOSIS — N949 Unspecified condition associated with female genital organs and menstrual cycle: Secondary | ICD-10-CM

## 2014-09-21 DIAGNOSIS — R109 Unspecified abdominal pain: Secondary | ICD-10-CM | POA: Diagnosis present

## 2014-09-21 DIAGNOSIS — O9989 Other specified diseases and conditions complicating pregnancy, childbirth and the puerperium: Secondary | ICD-10-CM | POA: Insufficient documentation

## 2014-09-21 DIAGNOSIS — R1032 Left lower quadrant pain: Secondary | ICD-10-CM | POA: Insufficient documentation

## 2014-09-21 LAB — URINALYSIS, ROUTINE W REFLEX MICROSCOPIC
Bilirubin Urine: NEGATIVE
Glucose, UA: NEGATIVE mg/dL
HGB URINE DIPSTICK: NEGATIVE
KETONES UR: NEGATIVE mg/dL
Leukocytes, UA: NEGATIVE
Nitrite: NEGATIVE
Protein, ur: NEGATIVE mg/dL
Specific Gravity, Urine: 1.025 (ref 1.005–1.030)
UROBILINOGEN UA: 0.2 mg/dL (ref 0.0–1.0)
pH: 6.5 (ref 5.0–8.0)

## 2014-09-21 NOTE — Discharge Instructions (Signed)
Braxton Hicks Contractions °Contractions of the uterus can occur throughout pregnancy. Contractions are not always a sign that you are in labor.  °WHAT ARE BRAXTON HICKS CONTRACTIONS?  °Contractions that occur before labor are called Braxton Hicks contractions, or false labor. Toward the end of pregnancy (32-34 weeks), these contractions can develop more often and may become more forceful. This is not true labor because these contractions do not result in opening (dilatation) and thinning of the cervix. They are sometimes difficult to tell apart from true labor because these contractions can be forceful and people have different pain tolerances. You should not feel embarrassed if you go to the hospital with false labor. Sometimes, the only way to tell if you are in true labor is for your health care provider to look for changes in the cervix. °If there are no prenatal problems or other health problems associated with the pregnancy, it is completely safe to be sent home with false labor and await the onset of true labor. °HOW CAN YOU TELL THE DIFFERENCE BETWEEN TRUE AND FALSE LABOR? °False Labor °· The contractions of false labor are usually shorter and not as hard as those of true labor.   °· The contractions are usually irregular.   °· The contractions are often felt in the front of the lower abdomen and in the groin.   °· The contractions may go away when you walk around or change positions while lying down.   °· The contractions get weaker and are shorter lasting as time goes on.   °· The contractions do not usually become progressively stronger, regular, and closer together as with true labor.   °True Labor °· Contractions in true labor last 30-70 seconds, become very regular, usually become more intense, and increase in frequency.   °· The contractions do not go away with walking.   °· The discomfort is usually felt in the top of the uterus and spreads to the lower abdomen and low back.   °· True labor can be  determined by your health care provider with an exam. This will show that the cervix is dilating and getting thinner.   °WHAT TO REMEMBER °· Keep up with your usual exercises and follow other instructions given by your health care provider.   °· Take medicines as directed by your health care provider.   °· Keep your regular prenatal appointments.   °· Eat and drink lightly if you think you are going into labor.   °· If Braxton Hicks contractions are making you uncomfortable:   °¨ Change your position from lying down or resting to walking, or from walking to resting.   °¨ Sit and rest in a tub of warm water.   °¨ Drink 2-3 glasses of water. Dehydration may cause these contractions.   °¨ Do slow and deep breathing several times an hour.   °WHEN SHOULD I SEEK IMMEDIATE MEDICAL CARE? °Seek immediate medical care if: °· Your contractions become stronger, more regular, and closer together.   °· You have fluid leaking or gushing from your vagina.   °· You have a fever.   °· You pass blood-tinged mucus.   °· You have vaginal bleeding.   °· You have continuous abdominal pain.   °· You have low back pain that you never had before.   °· You feel your baby's head pushing down and causing pelvic pressure.   °· Your baby is not moving as much as it used to.   °Document Released: 02/20/2005 Document Revised: 02/25/2013 Document Reviewed: 12/02/2012 °[employer]® Patient Information ©2015 [employer]. This information is not intended to replace advice given to you by your health care   provider. Make sure you discuss any questions you have with your health care provider. ° °

## 2014-09-21 NOTE — MAU Note (Signed)
Contracting since Sat, now every 5-96min.  Denies leaking or bleeding.

## 2014-09-21 NOTE — MAU Provider Note (Signed)
History     CSN: [id number]  Arrival date and time: 09/21/14 1054   First Provider Initiated Contact with Patient 09/21/14 1257      Chief Complaint  Patient presents with  . Labor Eval   HPI Comments: Pt is a a 29 [id number] at [redacted]w[redacted]d who presents to MAU w/ complaints of L sided abd pain which is constantly, but worsens when laying to the L and when baby kicks the area. She denies any changes in Urine freq, dysuria, hematuria or vaginal d/c.    OB History    Gravida Para Term Preterm AB TAB SAB Ectopic Multiple Living   4 2 2  0 1 1 0 0 0 2      Past Medical History  Diagnosis Date  . Herpes   . No pertinent past medical history   . Asthma   . Back pain   . Scoliosis   . Frequent headaches   . Herniated disc   . Pregnant 02/09/2014  . Spotting during pregnancy in first trimester 02/09/2014    Past Surgical History  Procedure Laterality Date  . No past surgeries      Family History  Problem Relation Age of Onset  . Hypertension Father   . Diabetes Maternal Grandmother   . Cancer Maternal Grandmother     pancreatic  . Cancer Maternal Grandfather     lungs    History  Substance Use Topics  . Smoking status: Never Smoker   . Smokeless tobacco: Never Used  . Alcohol Use: No    Allergies: No Known Allergies  Prescriptions prior to admission  Medication Sig Dispense Refill Last Dose  . acetaminophen (TYLENOL) 500 MG tablet Take 500 mg by mouth every 6 (six) hours as needed.   09/21/2014 at Unknown time  . acyclovir (ZOVIRAX) 400 MG tablet Take 1 tablet (400 mg total) by mouth 3 (three) times daily. 90 tablet 3 09/20/2014 at Unknown time  . ferrous sulfate 325 (65 FE) MG tablet Take 1 tablet (325 mg total) by mouth 2 (two) times daily with a meal. 60 tablet 3 09/20/2014 at Unknown time  . prenatal vitamin w/FE, FA (PRENATAL 1 + 1) 27-1 MG TABS tablet Take 1 tablet by mouth daily at 12 noon. 30 each 11 09/20/2014 at Unknown time  . Doxylamine-Pyridoxine 10-10 MG TBEC 2  PO qhs; may take 1po in am and 1po in afternoon prn nausea (Patient not taking: Reported on 04/28/2014) 120 tablet 3 Not Taking    Review of Systems  Constitutional: Negative for fever, chills and malaise/fatigue.  Respiratory: Negative for shortness of breath.   Cardiovascular: Positive for leg swelling. Negative for chest pain.  Gastrointestinal: Positive for abdominal pain.  Genitourinary: Negative for dysuria, urgency, frequency, hematuria and flank pain.  Musculoskeletal: Positive for back pain.  Neurological: Positive for weakness.   Physical Exam   Blood pressure 127/74, pulse 95, temperature 98.3 F (36.8 C), temperature source Oral, resp. rate 18, height 5\' 9"  (1.753 m), weight 147.238 kg (324 lb 9.6 oz), last menstrual period 01/08/2014.  Physical Exam  Constitutional: She is oriented to person, place, and time. She appears well-developed and well-nourished. No distress.  HENT:  Head: Normocephalic and atraumatic.  Eyes: Conjunctivae and EOM are normal.  Neck: Normal range of motion.  Cardiovascular: Normal rate, regular rhythm and normal heart sounds.   Respiratory: Effort normal and breath sounds normal. No respiratory distress.  GI: Soft. Bowel sounds are normal. She exhibits no distension and no mass.  There is tenderness. There is no rebound and no guarding.  Pt with mild tenderness in the LLQ  Genitourinary:  Cx FT/thick/-3  Musculoskeletal: Normal range of motion.  Neurological: She is alert and oriented to person, place, and time.  Skin: She is not diaphoretic.    MAU Course  Procedures  MDM Cx exam UA ordered NST read  Assessment and Plan  Pt is a 29 yo f [id number] who presents to MAU w/ complaints of LLQ abd pain  LLQ abd pain -diff included UTI, uterine ligament strain -will complete UA to evaluate for UTI and d/c w/ recs for tylenol and positional precautions   Christina Randall 09/21/2014, 1:05 PM   CNM attestation:  I have seen and examined this  patient; I agree with above documentation in the resident's note.   Christina Randall is a 29 y.o. [id number] reporting low abd discomfort +FM, denies LOF, VB, contractions, vaginal discharge.  PE: BP 128/73 mmHg  Pulse 82  Temp(Src) 98.3 F (36.8 C) (Oral)  Resp 16  Ht 5\' 9"  (1.753 m)  Wt 147.238 kg (324 lb 9.6 oz)  BMI 47.91 kg/m2  LMP 01/08/2014 Gen: calm comfortable, NAD Resp: normal effort, no distress Abd: gravid  ROS, labs, PMH reviewed NST reactive 130-140s, +accels, no decels No ctx per toco  Plan: - labor precautions - comfort meas rev'd - continue routine follow up in OB clinic  Christina Randall, CNM 6:06 PM

## 2014-09-21 NOTE — Telephone Encounter (Signed)
Pt c/o contractions on and off since Saturday night and pain on left bottom side of stomach, + FM, no vaginal bleeding. Pt states pushing water and taking tylenol with no improvement. Offered pt an appt today at 3 pm with Dr. Glo Herring. Pt states has a WIC appt and will not be able to come. Pt states she is going to [facility] for evaluation. Informed pt she is probably having Braxton Hicks contractions which is common this late in pregnancy. Pt states she is going to go to [facility] for evaluation for side pain.

## 2014-09-21 NOTE — MAU Note (Signed)
Assumed care of patient.

## 2014-09-24 ENCOUNTER — Ambulatory Visit (INDEPENDENT_AMBULATORY_CARE_PROVIDER_SITE_OTHER): Payer: Medicaid Other | Admitting: Obstetrics and Gynecology

## 2014-09-24 ENCOUNTER — Encounter: Payer: Self-pay | Admitting: Obstetrics and Gynecology

## 2014-09-24 VITALS — BP 120/72 | HR 68 | Wt 324.5 lb

## 2014-09-24 DIAGNOSIS — Z369 Encounter for antenatal screening, unspecified: Secondary | ICD-10-CM

## 2014-09-24 DIAGNOSIS — Z331 Pregnant state, incidental: Secondary | ICD-10-CM

## 2014-09-24 DIAGNOSIS — Z3493 Encounter for supervision of normal pregnancy, unspecified, third trimester: Secondary | ICD-10-CM

## 2014-09-24 DIAGNOSIS — Z1389 Encounter for screening for other disorder: Secondary | ICD-10-CM

## 2014-09-24 LAB — POCT URINALYSIS DIPSTICK
Glucose, UA: NEGATIVE
KETONES UA: NEGATIVE
LEUKOCYTES UA: NEGATIVE
Nitrite, UA: NEGATIVE
PROTEIN UA: NEGATIVE
RBC UA: NEGATIVE

## 2014-09-24 LAB — OB RESULTS CONSOLE GC/CHLAMYDIA
Chlamydia: NEGATIVE
Gonorrhea: NEGATIVE

## 2014-09-24 LAB — OB RESULTS CONSOLE GBS: GBS: POSITIVE

## 2014-09-24 NOTE — Progress Notes (Signed)
Pt states that she is still having pain on her left side, pt went to [facility] on Monday and was told that it was round ligament pain.

## 2014-09-24 NOTE — Progress Notes (Signed)
Patient ID: Christina Randall, female   DOB: Jul 22, 1985, 29 y.o.   MRN: [id number] [redacted]w[redacted]d Estimated Date of Delivery: 10/15/14  Blood pressure 120/72, pulse 68, weight 324 lb 8 oz (147.192 kg), last menstrual period 01/08/2014.   refer to the ob flow sheet for FH and FHR, also BP, Wt, Urine results: negative  Patient reports good fetal movement, denies any bleeding and no rupture of membranes symptoms or regular contractions.  Patient complaints of ongoing left sided pain. Pt reports presenting to [facility] on Monday for the same and was Dx with round ligament pain.   FH: 40 cm FHR: 142  Questions were answered.  Cervix: dilated 2cm long anterior -2 to -3 Assessment:  1. GBS collected  2. LROB pregnancy at [redacted]w[redacted]d  Plan:  Continued routine obstetrical care.   F/u in 1 weeks for prenatal care.    This chart was scribed for Jonnie Kind, MD by Stephania Fragmin, ED Scribe. This patient was seen in room Room 3 and the patient's care was started at 2:33 PM.  I personally performed the services described in this documentation, which was SCRIBED in my presence. The recorded information has been reviewed and considered accurate. It has been edited as necessary during review. Jonnie Kind, MD

## 2014-09-26 LAB — GC/CHLAMYDIA PROBE AMP
CHLAMYDIA, DNA PROBE: NEGATIVE
Neisseria gonorrhoeae by PCR: NEGATIVE

## 2014-09-28 LAB — STREP GP B NAA+RFLX: Strep Gp B NAA+Rflx: POSITIVE — AB

## 2014-09-28 LAB — STREP GP B SUSCEPTIBILITY

## 2014-09-30 ENCOUNTER — Ambulatory Visit (INDEPENDENT_AMBULATORY_CARE_PROVIDER_SITE_OTHER): Payer: Medicaid Other | Admitting: Obstetrics and Gynecology

## 2014-09-30 ENCOUNTER — Encounter: Payer: Self-pay | Admitting: Obstetrics and Gynecology

## 2014-09-30 VITALS — BP 120/70 | HR 76 | Wt 321.0 lb

## 2014-09-30 DIAGNOSIS — Z331 Pregnant state, incidental: Secondary | ICD-10-CM

## 2014-09-30 DIAGNOSIS — Z1389 Encounter for screening for other disorder: Secondary | ICD-10-CM

## 2014-09-30 DIAGNOSIS — Z3493 Encounter for supervision of normal pregnancy, unspecified, third trimester: Secondary | ICD-10-CM

## 2014-09-30 LAB — POCT URINALYSIS DIPSTICK
Blood, UA: NEGATIVE
GLUCOSE UA: NEGATIVE
KETONES UA: NEGATIVE
Leukocytes, UA: NEGATIVE
NITRITE UA: NEGATIVE

## 2014-09-30 NOTE — Progress Notes (Signed)
Patient ID: Christina Randall, female   DOB: 1985/03/13, 29 y.o.   MRN: [id number]  [id number] [redacted]w[redacted]d Estimated Date of Delivery: 10/15/14  Blood pressure 120/70, pulse 76, weight 321 lb (145.605 kg), last menstrual period 01/08/2014.   refer to the ob flow sheet for FH and FHR, also BP, Wt, Urine results:notable for negative  Patient reports  + good fetal movement, denies any bleeding and no rupture of membranes symptoms or regular contractions.  Patient complaints: None. Patient does note she was diagnosed with round ligament pain at [facility], causing ongoing, unchanged, left sided pain. Patient does note an area of dry skin on her arm and neck.  FHR: 157 bpm FH: 39 cm  Physical exam Skin: Apparent eczema, antecubital fossa, base of neck  Questions were answered. Assessment: Pregnancy [redacted]w[redacted]d, LROB   Apparent eczema Plan:  Continued routine obstetrical care  1% hydrocortisone cream OTC for eczema   F/u in 1 weeks for pnx care   This chart was SCRIBED for Mallory Shirk, MD by Stephania Fragmin, ED Scribe. This patient was seen in room 1, and the patient's care was started at 11:37 AM.  I personally performed the services described in this documentation, which was SCRIBED in my presence. The recorded information has been reviewed and considered accurate. It has been edited as necessary during review. Jonnie Kind, MD

## 2014-09-30 NOTE — Progress Notes (Signed)
Pt denies any problems or concerns at this time.  

## 2014-10-08 ENCOUNTER — Ambulatory Visit (INDEPENDENT_AMBULATORY_CARE_PROVIDER_SITE_OTHER): Payer: Medicaid Other | Admitting: Obstetrics and Gynecology

## 2014-10-08 ENCOUNTER — Encounter: Payer: Self-pay | Admitting: Obstetrics and Gynecology

## 2014-10-08 VITALS — BP 120/64 | HR 76 | Wt 329.0 lb

## 2014-10-08 DIAGNOSIS — Z1389 Encounter for screening for other disorder: Secondary | ICD-10-CM

## 2014-10-08 DIAGNOSIS — Z3493 Encounter for supervision of normal pregnancy, unspecified, third trimester: Secondary | ICD-10-CM

## 2014-10-08 DIAGNOSIS — Z331 Pregnant state, incidental: Secondary | ICD-10-CM

## 2014-10-08 LAB — POCT URINALYSIS DIPSTICK
Blood, UA: NEGATIVE
GLUCOSE UA: NEGATIVE
Ketones, UA: NEGATIVE
NITRITE UA: NEGATIVE
Protein, UA: NEGATIVE

## 2014-10-08 NOTE — Progress Notes (Signed)
Patient ID: Christina Randall, female   DOB: 06/07/1985, 29 y.o.   MRN: [id number]  [id number] [redacted]w[redacted]d Estimated Date of Delivery: 10/15/14  Blood pressure 120/64, pulse 76, weight 329 lb (149.233 kg), last menstrual period 01/08/2014.   refer to the ob flow sheet for FH and FHR, also BP, Wt, Urine results:notable for negative  Patient reports  + good fetal movement, denies any bleeding and no rupture of membranes symptoms or regular contractions. Patient complaints:Pt reports increased back pain in the last few weeks. She has tried a supportive abdominal band with some relief. Pt has a history of chronic back pain.   FH-42 cm FHR-142 bpm Cervix-soft, closed  Questions were answered. Assessment: [redacted]w[redacted]d; [id number] Plan:  Continued routine obstetrical care. Consider IOL at 41 wk.  F/u in 1 week for routine care   This chart was scribed for Jonnie Kind, MD by Tula Nakayama, Medical Scribe. This patient was seen in room 1 and the patient's care was started at 12:03 PM.   I personally performed the services described in this documentation, which was SCRIBED in my presence. The recorded information has been reviewed and considered accurate. It has been edited as necessary during review. Jonnie Kind, MD

## 2014-10-13 ENCOUNTER — Inpatient Hospital Stay (HOSPITAL_COMMUNITY)
Admission: AD | Admit: 2014-10-13 | Discharge: 2014-10-13 | Disposition: A | Discharge status: Home / Self Care | Payer: Medicaid Other | Source: Ambulatory Visit | Attending: Obstetrics & Gynecology | Admitting: Obstetrics & Gynecology

## 2014-10-13 ENCOUNTER — Encounter (HOSPITAL_COMMUNITY): Payer: Self-pay | Admitting: *Deleted

## 2014-10-13 ENCOUNTER — Inpatient Hospital Stay (HOSPITAL_COMMUNITY)
Admission: AD | Admit: 2014-10-13 | Discharge: 2014-10-16 | DRG: 774 | Disposition: A | Discharge status: Home / Self Care | Payer: Medicaid Other | Source: Ambulatory Visit | Attending: Obstetrics & Gynecology | Admitting: Obstetrics & Gynecology

## 2014-10-13 DIAGNOSIS — O99824 Streptococcus B carrier state complicating childbirth: Secondary | ICD-10-CM | POA: Diagnosis present

## 2014-10-13 DIAGNOSIS — O9832 Other infections with a predominantly sexual mode of transmission complicating childbirth: Secondary | ICD-10-CM | POA: Diagnosis present

## 2014-10-13 DIAGNOSIS — O99214 Obesity complicating childbirth: Secondary | ICD-10-CM | POA: Diagnosis present

## 2014-10-13 DIAGNOSIS — A6000 Herpesviral infection of urogenital system, unspecified: Secondary | ICD-10-CM | POA: Diagnosis present

## 2014-10-13 DIAGNOSIS — Z3493 Encounter for supervision of normal pregnancy, unspecified, third trimester: Secondary | ICD-10-CM

## 2014-10-13 DIAGNOSIS — O321XX0 Maternal care for breech presentation, not applicable or unspecified: Principal | ICD-10-CM | POA: Diagnosis present

## 2014-10-13 DIAGNOSIS — Z3A39 39 weeks gestation of pregnancy: Secondary | ICD-10-CM | POA: Diagnosis present

## 2014-10-13 DIAGNOSIS — Z6841 Body Mass Index (BMI) 40.0 and over, adult: Secondary | ICD-10-CM

## 2014-10-13 NOTE — Progress Notes (Signed)
Marlou Porch CNM on unit and aware of pt's cervical reck with no change. EFM strip reviewed and pt stable for d/c home

## 2014-10-13 NOTE — Discharge Instructions (Signed)
Braxton Hicks Contractions °Contractions of the uterus can occur throughout pregnancy. Contractions are not always a sign that you are in labor.  °WHAT ARE BRAXTON HICKS CONTRACTIONS?  °Contractions that occur before labor are called Braxton Hicks contractions, or false labor. Toward the end of pregnancy (32-34 weeks), these contractions can develop more often and may become more forceful. This is not true labor because these contractions do not result in opening (dilatation) and thinning of the cervix. They are sometimes difficult to tell apart from true labor because these contractions can be forceful and people have different pain tolerances. You should not feel embarrassed if you go to the hospital with false labor. Sometimes, the only way to tell if you are in true labor is for your health care provider to look for changes in the cervix. °If there are no prenatal problems or other health problems associated with the pregnancy, it is completely safe to be sent home with false labor and await the onset of true labor. °HOW CAN YOU TELL THE DIFFERENCE BETWEEN TRUE AND FALSE LABOR? °False Labor °· The contractions of false labor are usually shorter and not as hard as those of true labor.   °· The contractions are usually irregular.   °· The contractions are often felt in the front of the lower abdomen and in the groin.   °· The contractions may go away when you walk around or change positions while lying down.   °· The contractions get weaker and are shorter lasting as time goes on.   °· The contractions do not usually become progressively stronger, regular, and closer together as with true labor.   °True Labor °· Contractions in true labor last 30-70 seconds, become very regular, usually become more intense, and increase in frequency.   °· The contractions do not go away with walking.   °· The discomfort is usually felt in the top of the uterus and spreads to the lower abdomen and low back.   °· True labor can be  determined by your health care provider with an exam. This will show that the cervix is dilating and getting thinner.   °WHAT TO REMEMBER °· Keep up with your usual exercises and follow other instructions given by your health care provider.   °· Take medicines as directed by your health care provider.   °· Keep your regular prenatal appointments.   °· Eat and drink lightly if you think you are going into labor.   °· If Braxton Hicks contractions are making you uncomfortable:   °¨ Change your position from lying down or resting to walking, or from walking to resting.   °¨ Sit and rest in a tub of warm water.   °¨ Drink 2-3 glasses of water. Dehydration may cause these contractions.   °¨ Do slow and deep breathing several times an hour.   °WHEN SHOULD I SEEK IMMEDIATE MEDICAL CARE? °Seek immediate medical care if: °· Your contractions become stronger, more regular, and closer together.   °· You have fluid leaking or gushing from your vagina.   °· You have a fever.   °· You pass blood-tinged mucus.   °· You have vaginal bleeding.   °· You have continuous abdominal pain.   °· You have low back pain that you never had before.   °· You feel your baby's head pushing down and causing pelvic pressure.   °· Your baby is not moving as much as it used to.   °Document Released: 02/20/2005 Document Revised: 02/25/2013 Document Reviewed: 12/02/2012 °[employer]® Patient Information ©2015 [employer]. This information is not intended to replace advice given to you by your health care   provider. Make sure you discuss any questions you have with your health care provider. ° °

## 2014-10-13 NOTE — Progress Notes (Signed)
Written and verbal d/c instructions given and understanding voiced. 

## 2014-10-13 NOTE — MAU Note (Addendum)
Contractions became regular today, now are every 5-64min.  Was 2 when last checked

## 2014-10-13 NOTE — Progress Notes (Signed)
Marlou Porch CNM on unit and aware of pt's admission and status.

## 2014-10-13 NOTE — MAU Note (Signed)
Having contractions all night. No leaking no blood

## 2014-10-14 ENCOUNTER — Inpatient Hospital Stay (HOSPITAL_COMMUNITY): Payer: Medicaid Other | Admitting: Anesthesiology

## 2014-10-14 ENCOUNTER — Encounter (HOSPITAL_COMMUNITY): Payer: Self-pay | Admitting: *Deleted

## 2014-10-14 DIAGNOSIS — A6000 Herpesviral infection of urogenital system, unspecified: Secondary | ICD-10-CM | POA: Diagnosis present

## 2014-10-14 DIAGNOSIS — Z6841 Body Mass Index (BMI) 40.0 and over, adult: Secondary | ICD-10-CM

## 2014-10-14 DIAGNOSIS — Z3A39 39 weeks gestation of pregnancy: Secondary | ICD-10-CM | POA: Diagnosis present

## 2014-10-14 DIAGNOSIS — O9832 Other infections with a predominantly sexual mode of transmission complicating childbirth: Secondary | ICD-10-CM | POA: Diagnosis not present

## 2014-10-14 DIAGNOSIS — O99214 Obesity complicating childbirth: Secondary | ICD-10-CM | POA: Diagnosis present

## 2014-10-14 DIAGNOSIS — O99824 Streptococcus B carrier state complicating childbirth: Secondary | ICD-10-CM

## 2014-10-14 DIAGNOSIS — O321XX0 Maternal care for breech presentation, not applicable or unspecified: Secondary | ICD-10-CM | POA: Diagnosis present

## 2014-10-14 LAB — CBC
HCT: 31.2 % — ABNORMAL LOW (ref 36.0–46.0)
Hemoglobin: 9.9 g/dL — ABNORMAL LOW (ref 12.0–15.0)
MCH: 23.3 pg — ABNORMAL LOW (ref 26.0–34.0)
MCHC: 31.7 g/dL (ref 30.0–36.0)
MCV: 73.6 fL — AB (ref 78.0–100.0)
PLATELETS: 246 10*3/uL (ref 150–400)
RBC: 4.24 MIL/uL (ref 3.87–5.11)
RDW: 17.2 % — AB (ref 11.5–15.5)
WBC: 7.5 10*3/uL (ref 4.0–10.5)

## 2014-10-14 LAB — TYPE AND SCREEN
ABO/RH(D): O POS
Antibody Screen: NEGATIVE

## 2014-10-14 LAB — ABO/RH: ABO/RH(D): O POS

## 2014-10-14 LAB — RPR: RPR Ser Ql: NONREACTIVE

## 2014-10-14 MED ORDER — IBUPROFEN 600 MG PO TABS
600.0000 mg | ORAL_TABLET | Freq: Four times a day (QID) | ORAL | Status: DC
  Administered 2014-10-14 – 2014-10-16 (×9): 600 mg via ORAL
  Filled 2014-10-14 (×10): qty 1

## 2014-10-14 MED ORDER — ACETAMINOPHEN 325 MG PO TABS: 650.0000 mg | ORAL_TABLET | ORAL | Status: DC | PRN

## 2014-10-14 MED ORDER — DIPHENHYDRAMINE HCL 50 MG/ML IJ SOLN: 12.5000 mg | INTRAMUSCULAR | Status: DC | PRN

## 2014-10-14 MED ORDER — PHENYLEPHRINE 40 MCG/ML (10ML) SYRINGE FOR IV PUSH (FOR BLOOD PRESSURE SUPPORT)
80.0000 ug | PREFILLED_SYRINGE | INTRAVENOUS | Status: DC | PRN
  Filled 2014-10-14: qty 2

## 2014-10-14 MED ORDER — LACTATED RINGERS IV SOLN
INTRAVENOUS | Status: DC
  Administered 2014-10-14: via INTRAVENOUS

## 2014-10-14 MED ORDER — ONDANSETRON HCL 4 MG PO TABS: 4.0000 mg | ORAL_TABLET | ORAL | Status: DC | PRN

## 2014-10-14 MED ORDER — FLEET ENEMA 7-19 GM/118ML RE ENEM: 1.0000 | ENEMA | RECTAL | Status: DC | PRN

## 2014-10-14 MED ORDER — FENTANYL CITRATE (PF) 100 MCG/2ML IJ SOLN
50.0000 ug | Freq: Once | INTRAMUSCULAR | Status: AC
  Administered 2014-10-14: 50 ug via INTRAVENOUS

## 2014-10-14 MED ORDER — LIDOCAINE HCL (PF) 1 % IJ SOLN
30.0000 mL | INTRAMUSCULAR | Status: DC | PRN
  Filled 2014-10-14: qty 30

## 2014-10-14 MED ORDER — WITCH HAZEL-GLYCERIN EX PADS: 1.0000 "application " | MEDICATED_PAD | CUTANEOUS | Status: DC | PRN

## 2014-10-14 MED ORDER — ONDANSETRON HCL 4 MG/2ML IJ SOLN: 4.0000 mg | INTRAMUSCULAR | Status: DC | PRN

## 2014-10-14 MED ORDER — OXYCODONE-ACETAMINOPHEN 5-325 MG PO TABS: 1.0000 | ORAL_TABLET | ORAL | Status: DC | PRN

## 2014-10-14 MED ORDER — CITRIC ACID-SODIUM CITRATE 334-500 MG/5ML PO SOLN: 30.0000 mL | ORAL | Status: DC | PRN

## 2014-10-14 MED ORDER — FENTANYL CITRATE (PF) 100 MCG/2ML IJ SOLN
INTRAMUSCULAR | Status: AC
Start: 2014-10-14 — End: 2014-10-14
  Administered 2014-10-14: 50 ug via INTRAVENOUS
  Filled 2014-10-14: qty 2

## 2014-10-14 MED ORDER — FENTANYL 2.5 MCG/ML BUPIVACAINE 1/10 % EPIDURAL INFUSION (WH - ANES): 14.0000 mL/h | INTRAMUSCULAR | Status: DC | PRN

## 2014-10-14 MED ORDER — FENTANYL 2.5 MCG/ML BUPIVACAINE 1/10 % EPIDURAL INFUSION (WH - ANES)
INTRAMUSCULAR | Status: DC | PRN
  Administered 2014-10-14: 14 mL/h via EPIDURAL

## 2014-10-14 MED ORDER — DIPHENHYDRAMINE HCL 25 MG PO CAPS
25.0000 mg | ORAL_CAPSULE | Freq: Four times a day (QID) | ORAL | Status: DC | PRN
  Administered 2014-10-14: 25 mg via ORAL
  Filled 2014-10-14: qty 1

## 2014-10-14 MED ORDER — LACTATED RINGERS IV SOLN: 500.0000 mL | INTRAVENOUS | Status: DC | PRN

## 2014-10-14 MED ORDER — FENTANYL 2.5 MCG/ML BUPIVACAINE 1/10 % EPIDURAL INFUSION (WH - ANES)
INTRAMUSCULAR | Status: AC
  Filled 2014-10-14: qty 125

## 2014-10-14 MED ORDER — PRENATAL MULTIVITAMIN CH
1.0000 | ORAL_TABLET | Freq: Every day | ORAL | Status: DC
  Administered 2014-10-14 – 2014-10-16 (×3): 1 via ORAL
  Filled 2014-10-14 (×3): qty 1

## 2014-10-14 MED ORDER — LIDOCAINE HCL (PF) 1 % IJ SOLN
INTRAMUSCULAR | Status: DC | PRN
  Administered 2014-10-14 (×2): 5 mL

## 2014-10-14 MED ORDER — SENNOSIDES-DOCUSATE SODIUM 8.6-50 MG PO TABS
2.0000 | ORAL_TABLET | ORAL | Status: DC
  Administered 2014-10-15 (×2): 2 via ORAL
  Filled 2014-10-14 (×2): qty 2

## 2014-10-14 MED ORDER — ACYCLOVIR 400 MG PO TABS
400.0000 mg | ORAL_TABLET | Freq: Three times a day (TID) | ORAL | Status: DC
  Administered 2014-10-14 – 2014-10-16 (×7): 400 mg via ORAL
  Filled 2014-10-14 (×11): qty 1

## 2014-10-14 MED ORDER — OXYTOCIN 40 UNITS IN LACTATED RINGERS INFUSION - SIMPLE MED
62.5000 mL/h | INTRAVENOUS | Status: DC
  Filled 2014-10-14: qty 1000

## 2014-10-14 MED ORDER — EPHEDRINE 5 MG/ML INJ
10.0000 mg | INTRAVENOUS | Status: DC | PRN
  Filled 2014-10-14: qty 2

## 2014-10-14 MED ORDER — OXYCODONE-ACETAMINOPHEN 5-325 MG PO TABS: 2.0000 | ORAL_TABLET | ORAL | Status: DC | PRN

## 2014-10-14 MED ORDER — OXYCODONE-ACETAMINOPHEN 5-325 MG PO TABS
2.0000 | ORAL_TABLET | ORAL | Status: DC | PRN
  Administered 2014-10-14 – 2014-10-15 (×4): 2 via ORAL
  Filled 2014-10-14 (×5): qty 2

## 2014-10-14 MED ORDER — DIBUCAINE 1 % RE OINT: 1.0000 "application " | TOPICAL_OINTMENT | RECTAL | Status: DC | PRN

## 2014-10-14 MED ORDER — TETANUS-DIPHTH-ACELL PERTUSSIS 5-2.5-18.5 LF-MCG/0.5 IM SUSP: 0.5000 mL | Freq: Once | INTRAMUSCULAR | Status: DC

## 2014-10-14 MED ORDER — SODIUM CHLORIDE 0.9 % IV SOLN
2.0000 g | Freq: Once | INTRAVENOUS | Status: AC
  Administered 2014-10-14: 2 g via INTRAVENOUS
  Filled 2014-10-14: qty 2000

## 2014-10-14 MED ORDER — LANOLIN HYDROUS EX OINT: TOPICAL_OINTMENT | CUTANEOUS | Status: DC | PRN

## 2014-10-14 MED ORDER — OXYCODONE-ACETAMINOPHEN 5-325 MG PO TABS
1.0000 | ORAL_TABLET | ORAL | Status: DC | PRN
  Administered 2014-10-14 – 2014-10-15 (×2): 1 via ORAL
  Filled 2014-10-14 (×2): qty 1

## 2014-10-14 MED ORDER — ONDANSETRON HCL 4 MG/2ML IJ SOLN: 4.0000 mg | Freq: Four times a day (QID) | INTRAMUSCULAR | Status: DC | PRN

## 2014-10-14 MED ORDER — SIMETHICONE 80 MG PO CHEW: 80.0000 mg | CHEWABLE_TABLET | ORAL | Status: DC | PRN

## 2014-10-14 MED ORDER — FENTANYL 2.5 MCG/ML BUPIVACAINE 1/10 % EPIDURAL INFUSION (WH - ANES)
14.0000 mL/h | INTRAMUSCULAR | Status: DC | PRN
Start: 2014-10-14 — End: 2014-10-14

## 2014-10-14 MED ORDER — PHENYLEPHRINE 40 MCG/ML (10ML) SYRINGE FOR IV PUSH (FOR BLOOD PRESSURE SUPPORT)
PREFILLED_SYRINGE | INTRAVENOUS | Status: AC
  Filled 2014-10-14: qty 10

## 2014-10-14 MED ORDER — OXYTOCIN BOLUS FROM INFUSION
500.0000 mL | INTRAVENOUS | Status: DC
  Administered 2014-10-14: 500 mL via INTRAVENOUS

## 2014-10-14 MED ORDER — ZOLPIDEM TARTRATE 5 MG PO TABS: 5.0000 mg | ORAL_TABLET | Freq: Every evening | ORAL | Status: DC | PRN

## 2014-10-14 MED ORDER — BENZOCAINE-MENTHOL 20-0.5 % EX AERO
1.0000 "application " | INHALATION_SPRAY | CUTANEOUS | Status: DC | PRN
  Administered 2014-10-14: 1 via TOPICAL
  Filled 2014-10-14: qty 56

## 2014-10-14 NOTE — H&P (Signed)
Christina Randall is a 29 y.o. female [id number] at [redacted]w[redacted]d by LMP presenting to MAU for SOL.  Has been having contractions now for several days, but earlier today contractions became "more intense" and have been occuring every few minutes. She reports +FMs, No LOF, no VB, no blurry vision, headaches or peripheral edema, and RUQ pain.  She plans on bottle feeding. She requests BTL/ablation for birth control.    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  Also complicated by opioid dependence earlier in pregnancy, though patient has since weaned off narcotics (documentation in chart that patient had weaned completely off by 06/23/14).  No known history of cHTN or DM.  GBS positive.   Dating: By LMP 01/08/14--->  Estimated Date of Delivery: 10/15/14  Sono:  05/26/14 Detailed 2nd Trimester [place] @[redacted]w[redacted]d  by U/S, @[redacted]w[redacted]d  by LMP, normal female anatomy, breech presentation, 356g, placenta anterior  Other [place] during this pregnancy: -02/10/14 (Dating and viability sonogram) -02/17/14 (Dating and viability sonogram) -03/31/14 (Nuchal translucency)  ROS: All systems reviewed and negative except as stated in HPI   OB History    Gravida Para Term Preterm AB TAB SAB Ectopic Multiple Living   4 2 2  0 1 1 0 0 0 2     Past Medical History  Diagnosis Date  . Herpes   . No pertinent past medical history   . Asthma   . Back pain   . Scoliosis   . Frequent headaches   . Herniated disc   . Pregnant 02/09/2014  . Spotting during pregnancy in first trimester 02/09/2014   Past Surgical History  Procedure Laterality Date  . No past surgeries     Family History: family history includes Cancer in her maternal grandfather and maternal grandmother; Diabetes in her maternal grandmother; Hypertension in her father.  Social History:  reports  that she has never smoked. She has never used smokeless tobacco. She reports that she does not drink alcohol or use illicit drugs.  [place], [place], 1st baby, dating  Dating By LMP/ c/w 6wk u/s  Pap 03/17/2014 NORMAL  GC/CT Initial: -/- 36+wks:  Genetic Screen AFP: neg   CF screen negative  Anatomic [place] Normal female ' Burney Gauze'  Flu vaccine declined  Tdap Recommended ~ 28wks  Glucose Screen  2 hr normal: 81/146/117  GBS   Feed Preference Thinking about breast  Contraception Interval BTL/Ablation  Circumcision Yes, at [place] declined  Pediatrician Undecided, info given    Prenatal Transfer Tool  Maternal Diabetes: No Genetic Screening: Normal Maternal Ultrasounds/Referrals: Normal Fetal Ultrasounds or other Referrals: None Maternal Substance Abuse: Opioid use (Norco) earlier in pregnancy, weaned off entirely in April 2016 Significant Maternal Medications: Meds include: Other: Acyclovir Significant Maternal Lab Results: Lab values include: Group B Strep positive Other Comments: HSV-II  Physical Exam: Last menstrual period 01/08/2014. General appearance: Uncomfortable during contractions but appears nontoxic Lungs: Normal work of breathing Heart: regular rate and rhythm, no murmur Abdomen: soft, non-tender; Gravid abdomen consistent with dates Pelvic: No visible lesions on perineum, vulva or inside vagina.   Extremities: No LE edema, no sign of DVT Presentation: cephalic Fetal monitoring: Baseline HR 130bpm, good variability, accelerations present, early decels  In MAU: Dilation: 4.5 Effacement (%): 90 Station: -2 Exam by:: Denise RN Last menstrual period 01/08/2014.  Prenatal labs: ABO, Rh:  Antibody: Negative (05/17 0913) Rubella: 2.65 (01/12 1520) RPR: Non Reactive (05/17 0913)  HBsAg: NEGATIVE (01/12 1520)  HIV: NONREACTIVE (01/12 1520)  GBS:  Positive  Results for orders placed or performed during the hospital encounter of 10/13/14 (from the past 24 hour(s))  CBC     Status: Abnormal   Collection Time: 10/14/14  1:06 AM  Result Value Ref Range   WBC 7.5 4.0 - 10.5 K/uL   RBC 4.24 3.87 - 5.11 MIL/uL   Hemoglobin 9.9 (L) 12.0 - 15.0 g/dL   HCT 31.2 (L) 36.0 - 46.0 %   MCV 73.6 (L) 78.0 - 100.0 fL   MCH 23.3 (L) 26.0 - 34.0 pg   MCHC 31.7 30.0 - 36.0 g/dL   RDW 17.2 (H) 11.5 - 15.5 %   Platelets 246 150 - 400 K/uL  Type and screen     Status: None (Preliminary result)   Collection Time: 10/14/14  1:06 AM  Result Value Ref Range   ABO/RH(D) O POS    Antibody Screen NEG    Sample Expiration 10/17/2014    On L&D: Dilation: 6 Effacement (%): 90 Station: -2 Presentation: Vertex Exam by:: E. Rothermel   Assessment/Plan: Christina Randall is a 29 y/o [id number] presenting in spontaneous labor.  Admit to L&D with expectant management.  #Labor: Expectant management with augmentation as needed #Pain:  Epidural #FWB:  Category 1 strip #ID: GBS prophylaxis initiated with Ampicillin 2g IV upon admission to L&D #MOF: Bottle #MOC: BTL/Ablation #Circ:  Unknown  The above information was documented with assistance from Ladell Pier, MS3.    Christina Ivan, MD 10/14/2014, 12:10 AM     OB FELLOW HISTORY AND PHYSICAL ATTESTATION  I have seen and examined this patient; I agree with above documentation in the resident's note.   Christina Randall is a 29 y.o. [id number] here for SOL.  PE: BP 104/77 mmHg  Pulse 61  Temp(Src) 98 F (36.7 C) (Oral)  Resp 20  Ht 5\' 9"  (1.753 m)  Wt 329 lb (149.233 kg)  BMI 48.56 kg/m2  SpO2 100%  LMP 01/08/2014  Breastfeeding? Unknown Gen: calm comfortable, NAD Resp: normal effort, no distress Abd: gravid  ROS, labs, PMH reviewed  Plan: See above  Christina Randall 10/14/2014, 7:48 AM

## 2014-10-14 NOTE — Progress Notes (Signed)
UR chart review completed.  

## 2014-10-14 NOTE — Progress Notes (Signed)
CSW acknowledges consult for history of opiate dependency.   CSW screening out referral at this time since MOB was prescribed hydrocodone.  No infant drug screen required since hydrocodone was prescribed.  No additional psychosocial concerns noted in MOB's chart.  Contact CSW if needs arise.  Lucita Ferrara, LCSW [phone]

## 2014-10-14 NOTE — Anesthesia Procedure Notes (Signed)
Epidural Patient location during procedure: OB Start time: 10/14/2014 1:28 AM End time: 10/14/2014 1:47 AM  Staffing Anesthesiologist: Alexis Frock  Preanesthetic Checklist Completed: patient identified, site marked, surgical consent, pre-op evaluation, timeout performed, IV checked, risks and benefits discussed and monitors and equipment checked  Epidural Patient position: sitting Prep: site prepped and draped and DuraPrep Patient monitoring: heart rate, continuous pulse ox and blood pressure Approach: midline Location: L3-L4 Injection technique: LOR air  Needle:  Needle type: Tuohy  Needle gauge: 17 G Needle length: 9 cm and 9 Needle insertion depth: 8 cm Catheter type: closed end flexible Catheter size: 20 Guage Catheter at skin depth: 15 cm Test dose: negative  Assessment Events: blood not aspirated, injection not painful, no injection resistance, negative IV test and no paresthesia  Additional Notes   Patient tolerated the insertion well without complications.Reason for block:procedure for pain

## 2014-10-14 NOTE — Anesthesia Preprocedure Evaluation (Signed)
Anesthesia Evaluation  Patient identified by MRN, date of birth, ID band Patient awake and Patient confused    Reviewed: Allergy & Precautions, H&P , NPO status , Patient's Chart, lab work & pertinent test results  Airway Mallampati: II       Dental   Pulmonary neg pulmonary ROS,  breath sounds clear to auscultation  Pulmonary exam normal       Cardiovascular Exercise Tolerance: Good Normal cardiovascular examRhythm:regular Rate:Normal     Neuro/Psych Disk problems    GI/Hepatic   Endo/Other  Morbid obesity  Renal/GU      Musculoskeletal   Abdominal   Peds  Hematology   Anesthesia Other Findings   Reproductive/Obstetrics (+) Pregnancy                             Anesthesia Physical Anesthesia Plan  ASA: III  Anesthesia Plan: Epidural   Post-op Pain Management:    Induction:   Airway Management Planned:   Additional Equipment:   Intra-op Plan:   Post-operative Plan:   Informed Consent: I have reviewed the patients History and Physical, chart, labs and discussed the procedure including the risks, benefits and alternatives for the proposed anesthesia with the patient or authorized representative who has indicated his/her understanding and acceptance.     Plan Discussed with:   Anesthesia Plan Comments:         Anesthesia Quick Evaluation

## 2014-10-15 ENCOUNTER — Encounter: Payer: Medicaid Other | Admitting: Obstetrics and Gynecology

## 2014-10-15 NOTE — Progress Notes (Signed)
Patient ID: Christina Randall, female   DOB: 03-27-1985, 29 y.o.   MRN: [id number] Post Partum Day #1  Subjective: Christina Randall is a 29 y.o. [id number] [redacted]w[redacted]d s/p SVD of term female infant. No acute events overnight. Pt denies problems with ambulating, voiding or po intake. Also denies any dizziness or leg swelling. She denies nausea or vomiting. Pain is well controlled. She has had flatus. Lochia Small. Plan for birth control is future bilateral tubal ligation. Method of Feeding: Bottle  Objective: Blood pressure 97/54, pulse 83, temperature 98.3 F (36.8 C), temperature source Oral, resp. rate 18, height 5\' 9"  (1.753 m), weight 149.233 kg (329 lb), last menstrual period 01/08/2014, SpO2 99 %, unknown if currently breastfeeding.  Physical Exam:  General: alert, cooperative and no distress, laying comfortably in bed Lochia: normal flow Chest: CTAB, normal work of breathing Heart: RRR no murmur Abdomen: +BS, soft, nontender Uterine Fundus: firm DVT Evaluation: No calf tenderness Extremities: No pitting LE edema   Recent Labs (last 2 labs)      Recent Labs  10/14/14 0106  HGB 9.9*  HCT 31.2*      Assessment/Plan:  ASSESSMENT: Christina Randall is a 29 y.o. [id number] [redacted]w[redacted]d s/p SVD with no postpartum complications. Overall doing well. Infant will be observed for at least 48 hours due to inadequate GBS treatment. Planning [facility]. Plan to discharge tomorrow.  Plan for discharge tomorrow   LOS: 1 day   The above information was documented with assistance from Ladell Pier, [place].  Ladell Pier 10/15/2014, 10:00 AM   CNM attestation Post Partum Day #1  Christina Randall is a 29 y.o. [id number] s/p SVD.  Pt denies problems with ambulating, voiding or po intake. Pain is well controlled.  Plan for birth control is interval BTL.  Method of Feeding: breast  PE:  BP 97/54 mmHg  Pulse 83  Temp(Src) 98.3 F (36.8 C) (Oral)  Resp 18  Ht  5\' 9"  (1.753 m)  Wt 149.233 kg (329 lb)  BMI 48.56 kg/m2  SpO2 99%  LMP 01/08/2014  Breastfeeding? Unknown Fundus firm  Plan for discharge: 10/16/14  Serita Grammes, CNM 10:46 AM  10/15/2014

## 2014-10-16 MED ORDER — IBUPROFEN 600 MG PO TABS: 600.0000 mg | ORAL_TABLET | Freq: Four times a day (QID) | ORAL | Status: DC

## 2014-10-16 NOTE — Discharge Summary (Signed)
Obstetric Discharge Summary Reason for Admission: onset of labor Prenatal Procedures: none Intrapartum Procedures: spontaneous vaginal delivery Postpartum Procedures: none Complications-Operative and Postpartum: none  Delivery Note At 3:09 AM a viable female was delivered via Vaginal, Spontaneous Delivery (Presentation: Right Occiput Anterior). APGAR: 8, 9; weight pending.  Placenta status: Intact, Spontaneous. Cord: with the following complications: None.   Anesthesia: Epidural  Episiotomy: None Lacerations: None Est. Blood Loss (mL): Per RN documentation, minimal  Mom to postpartum. Baby to Couplet care / Skin to Skin.   Hospital Course:  Active Problems:   Normal delivery   Christina Randall is a 29 y.o. [id number] s/p SVD.  Patient was admitted 8/10.  She has postpartum course that was uncomplicated including no problems with ambulating, PO intake, urination, pain, or bleeding. The pt feels ready to go home and  will be discharged with outpatient follow-up.   Today: No acute events overnight.  Pt denies problems with ambulating, voiding or po intake.  She denies nausea or vomiting.  Pain is well controlled.  She has had flatus. She has not had bowel movement.  Lochia Small.  Plan for birth control is interval bilateral tubal ligation.  Method of Feeding: breast  Physical Exam:  General: alert, cooperative, appears stated age and no distress Lochia: appropriate Uterine Fundus: firm DVT Evaluation: No evidence of DVT seen on physical exam.  H/H: Lab Results  Component Value Date/Time   HGB 9.9* 10/14/2014 01:06 AM   HCT 31.2* 10/14/2014 01:06 AM   HCT 29.3* 07/21/2014 09:13 AM    Discharge Diagnoses: Term Pregnancy-delivered  Discharge Information: Date: 10/16/2014 Activity: pelvic rest Diet: routine  Medications: PNV and Ibuprofen Breast feeding:  Yes Condition: stable Instructions: refer to handout Discharge to: home      Medication List    TAKE  these medications        acetaminophen 500 MG tablet  Commonly known as:  TYLENOL  Take 500 mg by mouth every 6 (six) hours as needed for moderate pain.     acyclovir 400 MG tablet  Commonly known as:  ZOVIRAX  Take 1 tablet (400 mg total) by mouth 3 (three) times daily.     ferrous sulfate 325 (65 FE) MG tablet  Take 1 tablet (325 mg total) by mouth 2 (two) times daily with a meal.     ibuprofen 600 MG tablet  Commonly known as:  ADVIL,MOTRIN  Take 1 tablet (600 mg total) by mouth every 6 (six) hours.     prenatal vitamin w/FE, FA 27-1 MG Tabs tablet  Take 1 tablet by mouth daily at 12 noon.           Follow-up Information    Follow up with [facility]. Schedule an appointment as soon as possible for a visit in 6 weeks.   Why:  for postpartum visit   Contact information:   [place] [phone] [phone]      [name] ,MD 10/16/2014,3:26 AM  OB FELLOW DISCHARGE ATTESTATION  I have seen and examined this patient and agree with above documentation in the resident's note. Plan for interval BTL.  Desma Maxim, MD 10:31 AM

## 2014-10-16 NOTE — Discharge Instructions (Signed)

## 2014-11-02 ENCOUNTER — Telehealth: Payer: Self-pay | Admitting: Women's Health

## 2014-11-02 NOTE — Telephone Encounter (Signed)
Pt delivered on 08/10 and wants to return to work ASAP, she had a vaginal delivery and works in Therapist, art.  Spoke with Knute Neu, CNM and she states pt will need an PP visit before she will release her to go back to work.  Pt informed and call transferred to front staff for appointment to be scheduled.

## 2014-11-10 ENCOUNTER — Ambulatory Visit (INDEPENDENT_AMBULATORY_CARE_PROVIDER_SITE_OTHER): Payer: Medicaid Other | Admitting: Women's Health

## 2014-11-10 ENCOUNTER — Encounter: Payer: Self-pay | Admitting: Women's Health

## 2014-11-10 NOTE — Progress Notes (Signed)
Patient ID: Christina Randall, female   DOB: 09/04/1985, 29 y.o.   MRN: [id number] Subjective:    Christina Randall is a 29 y.o. [phone] African American female who presents for a postpartum visit. She is 30 weeks postpartum tomorrow following a spontaneous vaginal delivery at 39.6 gestational weeks. Anesthesia: epidural. I have fully reviewed the prenatal and intrapartum course. Postpartum course has been uncomplicated. Baby's course has been complicated by cyst on Lt lower gums- goes to see ENT tomorrow. Baby is feeding by bottle. Bleeding no bleeding. Bowel function is normal. Bladder function is normal. Patient is not sexually active. Last sexual activity: prior to birth of baby. Contraception method is abstinence and wants endometrial ablation d/t heavy periods. Postpartum depression screening: negative. Score 0.  Last pap 03/17/14 and was normal. Denies ha, scotomata, ruq/epigastric pain, n/v.  No bp problems during pregnancy or prior to pregnancy. Went to PCP who thinks it's elevated d/t chronic back pain and not taking pain meds- told her ok to restart norco- but she hasn't picked up rx yet. The following portions of the patient's history were reviewed and updated as appropriate: allergies, current medications, past medical history, past surgical history and problem list.  Review of Systems Pertinent items are noted in HPI.   Filed Vitals:   11/10/14 1520  BP: 146/86  Pulse: 72  Weight: 306 lb (138.801 kg)   No LMP recorded.  Objective:   General:  alert, cooperative and no distress   Breasts:  deferred, no complaints  Lungs: clear to auscultation bilaterally  Heart:  regular rate and rhythm  Abdomen: soft, nontender   Vulva: normal  Vagina: normal vagina  Cervix:  closed  Corpus: Well-involuted  Adnexa:  Non-palpable  Rectal Exam: No hemorrhoids       No edema, 2+DTRs, no clonus Assessment:   Postpartum exam 4 wks s/p SVB Elevated bp likely r/t pain Bottlefeeding Depression  screening Contraception counseling   Plan:  Take bp bid at home, if still elevated after restarting her pain meds for chronic LBP to let us know Contraception: abstinence until ablation Follow up in: 2 weeks for pre-op w/ MD or earlier if needed  Tawnya Crook CNM, [facility] 11/10/2014 3:32 PM

## 2014-11-10 NOTE — Patient Instructions (Signed)
Endometrial Ablation Endometrial ablation removes the lining of the uterus (endometrium). It is usually a same-day, outpatient treatment. Ablation helps avoid major surgery, such as surgery to remove the cervix and uterus (hysterectomy). After endometrial ablation, you will have little or no menstrual bleeding and may not be able to have children. However, if you are premenopausal, you will need to use a reliable method of birth control following the procedure because of the small chance that pregnancy can occur. There are different reasons to have this procedure, which include:  Heavy periods.  Bleeding that is causing anemia.  Irregular bleeding.  Bleeding fibroids on the lining inside the uterus if they are smaller than 3 centimeters. This procedure should not be done if:  You want children in the future.  You have severe cramps with your menstrual period.  You have precancerous or cancerous cells in your uterus.  You were recently pregnant.  You have gone through menopause.  You have had major surgery on the uterus, such as a cesarean delivery. LET YOUR HEALTH CARE PROVIDER KNOW ABOUT:  Any allergies you have.  All medicines you are taking, including vitamins, herbs, eye drops, creams, and over-the-counter medicines.  Previous problems you or members of your family have had with the use of anesthetics.  Any blood disorders you have.  Previous surgeries you have had.  Medical conditions you have. RISKS AND COMPLICATIONS  Generally, this is a safe procedure. However, as with any procedure, complications can occur. Possible complications include:  Perforation of the uterus.  Bleeding.  Infection of the uterus, bladder, or vagina.  Injury to surrounding organs.  An air bubble to the lung (air embolus).  Pregnancy following the procedure.  Failure of the procedure to help the problem, requiring hysterectomy.  Decreased ability to diagnose cancer in the lining of  the uterus. BEFORE THE PROCEDURE  The lining of the uterus must be tested to make sure there is no pre-cancerous or cancer cells present.  An ultrasound may be performed to look at the size of the uterus and to check for abnormalities.  Medicines may be given to thin the lining of the uterus. PROCEDURE  During the procedure, your health care provider will use a tool called a resectoscope to help see inside your uterus. There are different ways to remove the lining of your uterus.   Radiofrequency - This method uses a radiofrequency-alternating electric current to remove the lining of the uterus.  Cryotherapy - This method uses extreme cold to freeze the lining of the uterus.  Heated-Free Liquid - This method uses heated salt (saline) solution to remove the lining of the uterus.  Microwave - This method uses high-energy microwaves to heat up the lining of the uterus to remove it.  Thermal balloon - This method involves inserting a catheter with a balloon tip into the uterus. The balloon tip is filled with heated fluid to remove the lining of the uterus. AFTER THE PROCEDURE  After your procedure, do not have sexual intercourse or insert anything into your vagina until permitted by your health care provider. After the procedure, you may experience:  Cramps.  Vaginal discharge.  Frequent urination. Document Released: 12/31/2003 Document Revised: 10/23/2012 Document Reviewed: 07/24/2012 [employer] Patient Information 2015 [employer]. This information is not intended to replace advice given to you by your health care provider. Make sure you discuss any questions you have with your health care provider.  

## 2014-11-11 ENCOUNTER — Ambulatory Visit: Payer: Medicaid Other | Admitting: Women's Health

## 2014-11-19 ENCOUNTER — Ambulatory Visit: Payer: Medicaid Other | Admitting: Advanced Practice Midwife

## 2014-11-24 ENCOUNTER — Telehealth: Payer: Self-pay | Admitting: *Deleted

## 2014-11-24 ENCOUNTER — Encounter: Payer: Self-pay | Admitting: Women's Health

## 2014-11-24 ENCOUNTER — Ambulatory Visit (INDEPENDENT_AMBULATORY_CARE_PROVIDER_SITE_OTHER): Payer: Medicaid Other | Admitting: Obstetrics & Gynecology

## 2014-11-24 ENCOUNTER — Ambulatory Visit: Payer: Medicaid Other | Admitting: Women's Health

## 2014-11-24 VITALS — BP 142/80 | HR 76 | Wt 305.0 lb

## 2014-11-24 DIAGNOSIS — Z3009 Encounter for other general counseling and advice on contraception: Secondary | ICD-10-CM | POA: Diagnosis not present

## 2014-11-24 NOTE — Telephone Encounter (Signed)
Pt states saw Dr. Elonda Husky today and her blood pressure was elevated and wanted to make sure Dr. Elonda Husky was aware of her B/P of 142/80 today.

## 2014-11-24 NOTE — Telephone Encounter (Signed)
That is fine 

## 2014-11-26 ENCOUNTER — Encounter (HOSPITAL_COMMUNITY): Payer: Self-pay | Admitting: *Deleted

## 2014-11-26 ENCOUNTER — Emergency Department (HOSPITAL_COMMUNITY)
Admission: EM | Admit: 2014-11-26 | Discharge: 2014-11-26 | Disposition: A | Discharge status: Home / Self Care | Payer: Medicaid Other | Attending: Emergency Medicine | Admitting: Emergency Medicine

## 2014-11-26 DIAGNOSIS — G43109 Migraine with aura, not intractable, without status migrainosus: Secondary | ICD-10-CM | POA: Insufficient documentation

## 2014-11-26 DIAGNOSIS — J45909 Unspecified asthma, uncomplicated: Secondary | ICD-10-CM | POA: Insufficient documentation

## 2014-11-26 DIAGNOSIS — R001 Bradycardia, unspecified: Secondary | ICD-10-CM | POA: Insufficient documentation

## 2014-11-26 DIAGNOSIS — Z8619 Personal history of other infectious and parasitic diseases: Secondary | ICD-10-CM | POA: Diagnosis not present

## 2014-11-26 DIAGNOSIS — R51 Headache: Secondary | ICD-10-CM | POA: Diagnosis present

## 2014-11-26 DIAGNOSIS — Z8739 Personal history of other diseases of the musculoskeletal system and connective tissue: Secondary | ICD-10-CM | POA: Insufficient documentation

## 2014-11-26 MED ORDER — SUMATRIPTAN SUCCINATE 50 MG PO TABS: 50.0000 mg | ORAL_TABLET | ORAL | Status: DC | PRN

## 2014-11-26 MED ORDER — AMLODIPINE BESYLATE 2.5 MG PO TABS: 2.5000 mg | ORAL_TABLET | Freq: Every day | ORAL | Status: DC

## 2014-11-26 MED ORDER — KETOROLAC TROMETHAMINE 60 MG/2ML IM SOLN
60.0000 mg | Freq: Once | INTRAMUSCULAR | Status: AC
  Administered 2014-11-26: 60 mg via INTRAMUSCULAR
  Filled 2014-11-26: qty 2

## 2014-11-26 NOTE — ED Provider Notes (Signed)
CSN: [id number]     Arrival date & time 11/26/14  1254 History   First MD Initiated Contact with Patient 11/26/14 1300     Chief Complaint  Patient presents with  . Migraine     HPI Comments: Patient states that she has had migraine for the last 3 days. Located on the right side of her head and right eye. Describes it as throbbing. Migraine has not been relieved by home therapies including goody powder and tylenol. She has associated photophobia and phonophobia. She goes to sleep and wakes with headache. She endorses having vision changes (aura) before headache onset.  Severity 10/10.   Of note, patient with recent delivery on 8/10/206. Not complicated. However, after delivery patient states blood pressure has been slightly elevated. Her OBs are aware but she has not eben started on any therapy.   Patient is a 29 y.o. female presenting with migraines.  Migraine This is a recurrent problem. The problem has been unchanged. Associated symptoms include headaches, nausea, a visual change and vomiting. Pertinent negatives include no abdominal pain, chest pain, fever or weakness. She has tried sleep and NSAIDs for the symptoms. The treatment provided no relief.    Past Medical History  Diagnosis Date  . Herpes   . No pertinent past medical history   . Asthma   . Back pain   . Scoliosis   . Frequent headaches   . Herniated disc   . Pregnant 02/09/2014  . Spotting during pregnancy in first trimester 02/09/2014   Past Surgical History  Procedure Laterality Date  . No past surgeries     Family History  Problem Relation Age of Onset  . Hypertension Father   . Diabetes Maternal Grandmother   . Cancer Maternal Grandmother     pancreatic  . Cancer Maternal Grandfather     lungs   Social History  Substance Use Topics  . Smoking status: Never Smoker   . Smokeless tobacco: Never Used  . Alcohol Use: No   OB History    Gravida Para Term Preterm AB TAB SAB Ectopic Multiple Living   4 3 3   0 1 1 0 0 0 3     Review of Systems  Constitutional: Negative for fever.  Eyes: Positive for visual disturbance.  Respiratory: Negative for shortness of breath.   Cardiovascular: Negative for chest pain.  Gastrointestinal: Positive for nausea and vomiting. Negative for abdominal pain.  Neurological: Positive for dizziness, light-headedness and headaches. Negative for weakness.  Also per HPI  Allergies  Review of patient's allergies indicates no known allergies.  Home Medications   Prior to Admission medications   Medication Sig Start Date End Date Taking? Authorizing Provider  acetaminophen (TYLENOL) 500 MG tablet Take 500 mg by mouth every 6 (six) hours as needed for moderate pain.     Historical Provider, MD  acyclovir (ZOVIRAX) 400 MG tablet Take 1 tablet (400 mg total) by mouth 3 (three) times daily. Patient not taking: Reported on 11/10/2014 08/25/14   Roma Schanz, CNM  amLODipine (NORVASC) 2.5 MG tablet Take 1 tablet (2.5 mg total) by mouth daily. 11/26/14   Katheren Shams, DO  ferrous sulfate 325 (65 FE) MG tablet Take 1 tablet (325 mg total) by mouth 2 (two) times daily with a meal. Patient not taking: Reported on 11/10/2014 03/31/14   Roma Schanz, CNM  HYDROcodone-acetaminophen (NORCO) 10-325 MG per tablet TK 1 T PO UP TO QID PRF LOW BACK PAIN 11/04/14   Historical  Provider, MD  ibuprofen (ADVIL,MOTRIN) 600 MG tablet Take 1 tablet (600 mg total) by mouth every 6 (six) hours. Patient not taking: Reported on 11/10/2014 10/16/14   Marcene Corning, MD  Multiple Vitamins-Minerals (WOMENS ONE DAILY PO) Take by mouth.    Historical Provider, MD  prenatal vitamin w/FE, FA (PRENATAL 1 + 1) 27-1 MG TABS tablet Take 1 tablet by mouth daily at 12 noon. Patient not taking: Reported on 11/10/2014 02/09/14   Estill Dooms, NP  SUMAtriptan (IMITREX) 50 MG tablet Take 1 tablet (50 mg total) by mouth every 2 (two) hours as needed for migraine. May repeat in 2 hours if headache persists  or recurs. No more than 2 doses. 11/26/14   Katheren Shams, DO   BP 149/90 mmHg  Pulse 44  Temp(Src) 97.9 F (36.6 C) (Oral)  Resp 16  Ht 5\' 9"  (1.753 m)  Wt 295 lb (133.811 kg)  BMI 43.54 kg/m2  SpO2 100%  LMP 11/19/2014 Physical Exam  Constitutional: She is oriented to person, place, and time. She appears well-developed and well-nourished. No distress.  HENT:  Head: Normocephalic and atraumatic.  Mouth/Throat: Oropharynx is clear and moist.  Eyes: EOM are normal. Pupils are equal, round, and reactive to light.  Neck: Normal range of motion. Neck supple.  Cardiovascular: Regular rhythm, normal heart sounds and intact distal pulses.  Bradycardia present.   Pulmonary/Chest: Effort normal and breath sounds normal.  Abdominal: Soft. Bowel sounds are normal. There is no tenderness.  Musculoskeletal: Normal range of motion.  Neurological: She is alert and oriented to person, place, and time. She has normal strength. No cranial nerve deficit or sensory deficit.  Skin: Skin is warm and dry.  Psychiatric: She has a normal mood and affect.    ED Course  Procedures (including critical care time) Labs Review Labs Reviewed - No data to display  Imaging Review No results found. I have personally reviewed and evaluated these images and lab results as part of my medical decision-making.   EKG Interpretation   Date/Time:  Thursday November 26 2014 14:43:43 EDT Ventricular Rate:  43 PR Interval:  151 QRS Duration: 96 QT Interval:  509 QTC Calculation: 430 R Axis:   52 Text Interpretation:  Sinus bradycardia Confirmed by BEATON  MD, ROBERT  ([id number]) on 11/26/2014 2:58:46 PM      MDM   Final diagnoses:  Migraine with aura and without status migrainosus, not intractable   Patient presenting with migraine for 3 days. She has history of migraines. She failed home therapy. Neuro exam unremarkable on assessment.   No labs or imaging needed at this time. Since patient wants to drive  home from ED Toradol injection given for pain relief. Patient had some relief with injection with pain score down to 6/10. Will discharge home with limited amount of sumatriptan.  Patient noted to be bradycardic and hypertensive on admission. Bradycardia is normal sinus bradycardia. EKG unremarkable. Patient asymptomatic. For her blood pressure she was given Rx for low dose Norvasc with instructions to follow-up with PCP for monitoring and need for continued medication.   Discharge in stable condition. Patient agreeable.    Luiz Blare, DO 11/26/2014, 3:43 PM PGY-2, [place], DO 11/26/14 1552  Leonard Schwartz, MD 11/27/14 0730

## 2014-11-26 NOTE — Discharge Instructions (Signed)
Glad you are feeling better after treatment in ED Please take oral medication if headache not better. Do not take more than two doses without seeking help if not working. Follow-up with primary care doctor who can better manage migraines in the long run   Migraine Headache A migraine headache is very bad, throbbing pain on one or both sides of your head. Talk to your doctor about what things may bring on (trigger) your migraine headaches. HOME CARE  Only take medicines as told by your doctor.  Lie down in a dark, quiet room when you have a migraine.  Keep a journal to find out if certain things bring on migraine headaches. For example, write down:  What you eat and drink.  How much sleep you get.  Any change to your diet or medicines.  Lessen how much alcohol you drink.  Quit smoking if you smoke.  Get enough sleep.  Lessen any stress in your life.  Keep lights dim if bright lights bother you or make your migraines worse. GET HELP RIGHT AWAY IF:   Your migraine becomes really bad.  You have a fever.  You have a stiff neck.  You have trouble seeing.  Your muscles are weak, or you lose muscle control.  You lose your balance or have trouble walking.  You feel like you will pass out (faint), or you pass out.  You have really bad symptoms that are different than your first symptoms. MAKE SURE YOU:   Understand these instructions.  Will watch your condition.  Will get help right away if you are not doing well or get worse. Document Released: 11/30/2007 Document Revised: 05/15/2011 Document Reviewed: 10/28/2012 [facility] Patient Information 2015 [place], [place]. This information is not intended to replace advice given to you by your health care provider. Make sure you discuss any questions you have with your health care provider.

## 2014-11-26 NOTE — ED Notes (Signed)
Pt comes in with migraine that has lasted 3 days. Pt has history of the same. Pt has been taking extra strength tylenol and goody powders with no relief. NAD noted.

## 2014-11-26 NOTE — ED Notes (Signed)
Pt HR 48-80's.  Dr Gerarda Fraction informed.  Will monitor pt before discharging home.

## 2014-11-26 NOTE — ED Notes (Signed)
MD at bedside. 

## 2014-12-22 ENCOUNTER — Other Ambulatory Visit: Payer: Self-pay | Admitting: Obstetrics & Gynecology

## 2014-12-22 NOTE — Patient Instructions (Signed)
Christina Randall  12/22/2014     @PREFPERIOPPHARMACY @   Your procedure is scheduled on 12/30/2014  Report to [facility] at 7:00 A.M.  Call this number if you have problems the morning of surgery:  [phone]   Remember:  Do not eat food or drink liquids after midnight.  Take these medicines the morning of surgery with A SIP OF WATER Imitrex, Amlodipine, Norco   Do not wear jewelry, make-up or nail polish.  Do not wear lotions, powders, or perfumes.  You may wear deodorant.  Do not shave 48 hours prior to surgery.  Men may shave face and neck.  Do not bring valuables to the hospital.  [facility] is not responsible for any belongings or valuables.  Contacts, dentures or bridgework may not be worn into surgery.  Leave your suitcase in the car.  After surgery it may be brought to your room.  For patients admitted to the hospital, discharge time will be determined by your treatment team.  Patients discharged the day of surgery will not be allowed to drive home.    Please read over the following fact sheets that you were given. Surgical Site Infection Prevention and Anesthesia Post-op Instructions     PATIENT INSTRUCTIONS POST-ANESTHESIA  IMMEDIATELY FOLLOWING SURGERY:  Do not drive or operate machinery for the first twenty four hours after surgery.  Do not make any important decisions for twenty four hours after surgery or while taking narcotic pain medications or sedatives.  If you develop intractable nausea and vomiting or a severe headache please notify your doctor immediately.  FOLLOW-UP:  Please make an appointment with your surgeon as instructed. You do not need to follow up with anesthesia unless specifically instructed to do so.  WOUND CARE INSTRUCTIONS (if applicable):  Keep a dry clean dressing on the anesthesia/puncture wound site if there is drainage.  Once the wound has quit draining you may leave it open to air.  Generally you should leave the bandage  intact for twenty four hours unless there is drainage.  If the epidural site drains for more than 36-48 hours please call the anesthesia department.  QUESTIONS?:  Please feel free to call your physician or the hospital operator if you have any questions, and they will be happy to assist you.      Salpingectomy Salpingectomy, also called tubectomy, is the surgical removal of one of the fallopian tubes. The fallopian tubes are tubes that are connected to the uterus. These tubes transport the egg from the ovary to the uterus. A salpingectomy may be done for various reasons, including:   A tubal (ectopic) pregnancy. This is especially true if the tube ruptures.  An infected fallopian tube.  The need to remove the fallopian tube when removing an ovary with a cyst or tumor.  The need to remove the fallopian tube when removing the uterus.  Cancer of the fallopian tube or nearby organs. Removing one fallopian tube does not prevent you from becoming pregnant. It also does not cause problems with your menstrual periods.  LET [facility] CARE PROVIDER KNOW ABOUT:  Any allergies you have.  All medicines you are taking, including vitamins, herbs, eye drops, creams, and over-the-counter medicines.  Previous problems you or members of your family have had with the use of anesthetics.  Any blood disorders you have.  Previous surgeries you have had.  Medical conditions you have. RISKS AND COMPLICATIONS  Generally, this is a safe procedure. However, as with any procedure,  complications can occur. Possible complications include:  Injury to surrounding organs.  Bleeding.  Infection.  Problems related to anesthesia. BEFORE THE PROCEDURE  Ask your health care provider about changing or stopping your regular medicines. You may need to stop taking certain medicines, such as aspirin or blood thinners, at least 1 week before the surgery.  Do not eat or drink anything for at least 8 hours before  the surgery.  If you smoke, do not smoke for at least 2 weeks before the surgery.  Make plans to have someone drive you home after the procedure or after your hospital stay. Also arrange for someone to help you with activities during recovery. PROCEDURE   You will be given medicine to help you relax before the procedure (sedative). You will then be given medicine to make you sleep through the procedure (general anesthetic). These medicines will be given through an IV access tube that is put into one of your veins.  Once you are asleep, your lower abdomen will be shaved and cleaned. A thin, flexible tube (catheter) will be placed in your bladder.  The surgeon may use a laparoscopic, robotic, or open technique for this surgery:  In the laparoscopic technique, the surgery is done through two small cuts (incisions) in the abdomen. A thin, lighted tube with a tiny camera on the end (laparoscope) is inserted into one of the incisions. The tools needed for the procedure are put through the other incision.  A robotic technique may be chosen to perform complex surgery in a small space. In the robotic technique, small incisions will be made. A camera and surgical instruments are passed through the incisions. Surgical instruments will be controlled with the help of a robotic arm.  In the open technique, the surgery is done through one large incision in the abdomen.  Using any of these techniques, the surgeon removes the fallopian tube from where it attaches to the uterus. The blood vessels will be clamped and tied.  The surgeon then uses staples or stitches to close the incision or incisions. AFTER THE PROCEDURE   You will be taken to a recovery area where your progress will be monitored for 1-3 hours.  If the laparoscopic technique was used, you may be allowed to go home after several hours. You may have some shoulder pain after the laparoscopic procedure. This is normal and usually goes away in a  day or two.  If the open technique was used, you will be admitted to the hospital for a couple of days.  You will be given pain medicine if needed.  The IV access tube and catheter will be removed before you are discharged.   This information is not intended to replace advice given to you by your health care provider. Make sure you discuss any questions you have with your health care provider.   Document Released: 07/09/2008 Document Revised: 03/13/2014 Document Reviewed: 08/14/2012 Elsevier Interactive Patient Education [employer].

## 2014-12-23 ENCOUNTER — Other Ambulatory Visit: Payer: Self-pay

## 2014-12-23 ENCOUNTER — Encounter (HOSPITAL_COMMUNITY): Payer: Self-pay

## 2014-12-23 ENCOUNTER — Encounter (HOSPITAL_COMMUNITY)
Admission: RE | Admit: 2014-12-23 | Discharge: 2014-12-23 | Disposition: A | Discharge status: Home / Self Care | Payer: Medicaid Other | Source: Ambulatory Visit | Attending: Obstetrics & Gynecology | Admitting: Obstetrics & Gynecology

## 2014-12-23 DIAGNOSIS — Z01818 Encounter for other preprocedural examination: Secondary | ICD-10-CM | POA: Insufficient documentation

## 2014-12-23 HISTORY — DX: Essential (primary) hypertension: I10

## 2014-12-23 HISTORY — DX: Anemia, unspecified: D64.9

## 2014-12-23 LAB — CBC
HEMATOCRIT: 34.1 % — AB (ref 36.0–46.0)
Hemoglobin: 10.9 g/dL — ABNORMAL LOW (ref 12.0–15.0)
MCH: 24.2 pg — AB (ref 26.0–34.0)
MCHC: 32 g/dL (ref 30.0–36.0)
MCV: 75.6 fL — AB (ref 78.0–100.0)
PLATELETS: 285 10*3/uL (ref 150–400)
RBC: 4.51 MIL/uL (ref 3.87–5.11)
RDW: 17.4 % — AB (ref 11.5–15.5)
WBC: 4.5 10*3/uL (ref 4.0–10.5)

## 2014-12-23 LAB — COMPREHENSIVE METABOLIC PANEL
ALK PHOS: 61 U/L (ref 38–126)
ALT: 20 U/L (ref 14–54)
AST: 21 U/L (ref 15–41)
Albumin: 3.4 g/dL — ABNORMAL LOW (ref 3.5–5.0)
Anion gap: 6 (ref 5–15)
BILIRUBIN TOTAL: 0.3 mg/dL (ref 0.3–1.2)
BUN: 9 mg/dL (ref 6–20)
CO2: 25 mmol/L (ref 22–32)
Calcium: 8.8 mg/dL — ABNORMAL LOW (ref 8.9–10.3)
Chloride: 107 mmol/L (ref 101–111)
Creatinine, Ser: 0.75 mg/dL (ref 0.44–1.00)
GFR calc Af Amer: >60 mL/min (ref >60)
GLUCOSE: 93 mg/dL (ref 65–99)
POTASSIUM: 3.8 mmol/L (ref 3.5–5.1)
Sodium: 138 mmol/L (ref 135–145)
TOTAL PROTEIN: 6.8 g/dL (ref 6.5–8.1)

## 2014-12-23 LAB — HCG, QUANTITATIVE, PREGNANCY: hCG, Beta Chain, Quant, S: <1 m[IU]/mL (ref <5)

## 2014-12-24 ENCOUNTER — Telehealth: Payer: Self-pay | Admitting: *Deleted

## 2014-12-24 NOTE — Telephone Encounter (Signed)
Pt states she went to [facility] for pre-op and was told she could still get pregnant with the Laparoscopic Bilateral Salpingectomy. Pt states she has some questions in regards to the procedure because she has heavy periods and thought with this procedure she would not have a period and would like to discuss procedure that helps with heavy bleeding with periods. Surgery scheduled for 12/30/2014.

## 2014-12-30 ENCOUNTER — Encounter (HOSPITAL_COMMUNITY)
Admission: RE | Disposition: A | Discharge status: Home / Self Care | Payer: Self-pay | Source: Ambulatory Visit | Attending: Obstetrics & Gynecology

## 2014-12-30 ENCOUNTER — Encounter (HOSPITAL_COMMUNITY): Payer: Self-pay | Admitting: *Deleted

## 2014-12-30 ENCOUNTER — Ambulatory Visit (HOSPITAL_COMMUNITY)
Admission: RE | Admit: 2014-12-30 | Discharge: 2014-12-30 | Disposition: A | Discharge status: Home / Self Care | Payer: Medicaid Other | Source: Ambulatory Visit | Attending: Obstetrics & Gynecology | Admitting: Obstetrics & Gynecology

## 2014-12-30 ENCOUNTER — Ambulatory Visit (HOSPITAL_COMMUNITY): Payer: Medicaid Other | Admitting: Anesthesiology

## 2014-12-30 DIAGNOSIS — M2240 Chondromalacia patellae, unspecified knee: Secondary | ICD-10-CM | POA: Diagnosis not present

## 2014-12-30 DIAGNOSIS — Z833 Family history of diabetes mellitus: Secondary | ICD-10-CM | POA: Insufficient documentation

## 2014-12-30 DIAGNOSIS — Z6841 Body Mass Index (BMI) 40.0 and over, adult: Secondary | ICD-10-CM | POA: Diagnosis not present

## 2014-12-30 DIAGNOSIS — F1120 Opioid dependence, uncomplicated: Secondary | ICD-10-CM | POA: Diagnosis not present

## 2014-12-30 DIAGNOSIS — Z800 Family history of malignant neoplasm of digestive organs: Secondary | ICD-10-CM | POA: Diagnosis not present

## 2014-12-30 DIAGNOSIS — M549 Dorsalgia, unspecified: Secondary | ICD-10-CM | POA: Insufficient documentation

## 2014-12-30 DIAGNOSIS — R51 Headache: Secondary | ICD-10-CM | POA: Insufficient documentation

## 2014-12-30 DIAGNOSIS — M419 Scoliosis, unspecified: Secondary | ICD-10-CM | POA: Insufficient documentation

## 2014-12-30 DIAGNOSIS — D649 Anemia, unspecified: Secondary | ICD-10-CM | POA: Insufficient documentation

## 2014-12-30 DIAGNOSIS — Z801 Family history of malignant neoplasm of trachea, bronchus and lung: Secondary | ICD-10-CM | POA: Diagnosis not present

## 2014-12-30 DIAGNOSIS — M23309 Other meniscus derangements, unspecified meniscus, unspecified knee: Secondary | ICD-10-CM | POA: Insufficient documentation

## 2014-12-30 DIAGNOSIS — Z302 Encounter for sterilization: Secondary | ICD-10-CM | POA: Diagnosis present

## 2014-12-30 DIAGNOSIS — Z8249 Family history of ischemic heart disease and other diseases of the circulatory system: Secondary | ICD-10-CM | POA: Insufficient documentation

## 2014-12-30 DIAGNOSIS — M5126 Other intervertebral disc displacement, lumbar region: Secondary | ICD-10-CM | POA: Diagnosis not present

## 2014-12-30 DIAGNOSIS — I10 Essential (primary) hypertension: Secondary | ICD-10-CM | POA: Diagnosis not present

## 2014-12-30 DIAGNOSIS — M179 Osteoarthritis of knee, unspecified: Secondary | ICD-10-CM | POA: Insufficient documentation

## 2014-12-30 DIAGNOSIS — J45909 Unspecified asthma, uncomplicated: Secondary | ICD-10-CM | POA: Diagnosis not present

## 2014-12-30 HISTORY — PX: LAPAROSCOPIC BILATERAL SALPINGECTOMY: SHX5889

## 2014-12-30 SURGERY — SALPINGECTOMY, BILATERAL, LAPAROSCOPIC
Anesthesia: General | Laterality: Bilateral

## 2014-12-30 MED ORDER — MIDAZOLAM HCL 2 MG/2ML IJ SOLN
1.0000 mg | INTRAMUSCULAR | Status: DC | PRN
  Administered 2014-12-30: 2 mg via INTRAVENOUS

## 2014-12-30 MED ORDER — CEFAZOLIN SODIUM-DEXTROSE 2-3 GM-% IV SOLR
2.0000 g | INTRAVENOUS | Status: AC
  Administered 2014-12-30: 2 g via INTRAVENOUS

## 2014-12-30 MED ORDER — KETOROLAC TROMETHAMINE 30 MG/ML IJ SOLN
INTRAMUSCULAR | Status: AC
  Filled 2014-12-30: qty 1

## 2014-12-30 MED ORDER — BUPIVACAINE LIPOSOME 1.3 % IJ SUSP
INTRAMUSCULAR | Status: AC
  Filled 2014-12-30: qty 20

## 2014-12-30 MED ORDER — ROCURONIUM BROMIDE 100 MG/10ML IV SOLN
INTRAVENOUS | Status: DC | PRN
  Administered 2014-12-30: 22 mg via INTRAVENOUS
  Administered 2014-12-30: 10 mg via INTRAVENOUS
  Administered 2014-12-30: 8 mg via INTRAVENOUS

## 2014-12-30 MED ORDER — KETOROLAC TROMETHAMINE 10 MG PO TABS: 10.0000 mg | ORAL_TABLET | Freq: Three times a day (TID) | ORAL | Status: DC | PRN

## 2014-12-30 MED ORDER — NEOSTIGMINE METHYLSULFATE 10 MG/10ML IV SOLN
INTRAVENOUS | Status: DC | PRN
  Administered 2014-12-30: 4 mg via INTRAVENOUS

## 2014-12-30 MED ORDER — ONDANSETRON HCL 4 MG/2ML IJ SOLN
4.0000 mg | Freq: Once | INTRAMUSCULAR | Status: AC | PRN
  Administered 2014-12-30: 4 mg via INTRAVENOUS
  Filled 2014-12-30: qty 2

## 2014-12-30 MED ORDER — CEFAZOLIN SODIUM-DEXTROSE 2-3 GM-% IV SOLR
INTRAVENOUS | Status: AC
  Filled 2014-12-30: qty 50

## 2014-12-30 MED ORDER — FENTANYL CITRATE (PF) 100 MCG/2ML IJ SOLN
25.0000 ug | INTRAMUSCULAR | Status: DC | PRN
  Administered 2014-12-30: 50 ug via INTRAVENOUS
  Filled 2014-12-30: qty 2

## 2014-12-30 MED ORDER — ONDANSETRON HCL 8 MG PO TABS: 8.0000 mg | ORAL_TABLET | Freq: Three times a day (TID) | ORAL | Status: DC | PRN

## 2014-12-30 MED ORDER — SUCCINYLCHOLINE CHLORIDE 20 MG/ML IJ SOLN
INTRAMUSCULAR | Status: DC | PRN
  Administered 2014-12-30: 120 mg via INTRAVENOUS

## 2014-12-30 MED ORDER — GLYCOPYRROLATE 0.2 MG/ML IJ SOLN
INTRAMUSCULAR | Status: DC | PRN
  Administered 2014-12-30: .7 mg via INTRAVENOUS
  Administered 2014-12-30: .2 mg via INTRAVENOUS

## 2014-12-30 MED ORDER — FENTANYL CITRATE (PF) 250 MCG/5ML IJ SOLN
INTRAMUSCULAR | Status: DC | PRN
  Administered 2014-12-30 (×4): 50 ug via INTRAVENOUS

## 2014-12-30 MED ORDER — ROCURONIUM BROMIDE 50 MG/5ML IV SOLN
INTRAVENOUS | Status: AC
  Filled 2014-12-30: qty 1

## 2014-12-30 MED ORDER — LIDOCAINE HCL (CARDIAC) 20 MG/ML IV SOLN
INTRAVENOUS | Status: DC | PRN
  Administered 2014-12-30: 40 mg via INTRAVENOUS

## 2014-12-30 MED ORDER — SUCCINYLCHOLINE CHLORIDE 20 MG/ML IJ SOLN
INTRAMUSCULAR | Status: AC
  Filled 2014-12-30: qty 1

## 2014-12-30 MED ORDER — NEOSTIGMINE METHYLSULFATE 10 MG/10ML IV SOLN
INTRAVENOUS | Status: AC
  Filled 2014-12-30: qty 1

## 2014-12-30 MED ORDER — GLYCOPYRROLATE 0.2 MG/ML IJ SOLN
INTRAMUSCULAR | Status: AC
  Filled 2014-12-30: qty 4

## 2014-12-30 MED ORDER — KETOROLAC TROMETHAMINE 30 MG/ML IJ SOLN
30.0000 mg | Freq: Once | INTRAMUSCULAR | Status: AC
  Administered 2014-12-30: 30 mg via INTRAVENOUS

## 2014-12-30 MED ORDER — MIDAZOLAM HCL 2 MG/2ML IJ SOLN
INTRAMUSCULAR | Status: AC
  Filled 2014-12-30: qty 2

## 2014-12-30 MED ORDER — SODIUM CHLORIDE 0.9 % IR SOLN
Status: DC | PRN
  Administered 2014-12-30: 1000 mL

## 2014-12-30 MED ORDER — GLYCOPYRROLATE 0.2 MG/ML IJ SOLN
INTRAMUSCULAR | Status: AC
  Filled 2014-12-30: qty 1

## 2014-12-30 MED ORDER — BUPIVACAINE LIPOSOME 1.3 % IJ SUSP
INTRAMUSCULAR | Status: DC | PRN
  Administered 2014-12-30: 20 mL

## 2014-12-30 MED ORDER — FENTANYL CITRATE (PF) 250 MCG/5ML IJ SOLN
INTRAMUSCULAR | Status: AC
  Filled 2014-12-30: qty 25

## 2014-12-30 MED ORDER — PROPOFOL 10 MG/ML IV BOLUS
INTRAVENOUS | Status: AC
  Filled 2014-12-30: qty 20

## 2014-12-30 MED ORDER — LACTATED RINGERS IV SOLN
INTRAVENOUS | Status: DC
  Administered 2014-12-30 (×2): via INTRAVENOUS

## 2014-12-30 MED ORDER — LIDOCAINE HCL (PF) 1 % IJ SOLN
INTRAMUSCULAR | Status: AC
  Filled 2014-12-30: qty 5

## 2014-12-30 MED ORDER — PROPOFOL 10 MG/ML IV BOLUS
INTRAVENOUS | Status: DC | PRN
  Administered 2014-12-30: 200 mg via INTRAVENOUS

## 2014-12-30 MED ORDER — HYDROCODONE-ACETAMINOPHEN 5-325 MG PO TABS: 1.0000 | ORAL_TABLET | Freq: Four times a day (QID) | ORAL | Status: DC | PRN

## 2014-12-30 SURGICAL SUPPLY — 36 items
BAG HAMPER (MISCELLANEOUS) ×3 IMPLANT
BLADE SURG SZ11 CARB STEEL (BLADE) ×3 IMPLANT
CLOTH BEACON ORANGE TIMEOUT ST (SAFETY) ×3 IMPLANT
COVER LIGHT HANDLE STERIS (MISCELLANEOUS) ×6 IMPLANT
DRAPE PROXIMA HALF (DRAPES) ×3 IMPLANT
ELECT REM PT RETURN 9FT ADLT (ELECTROSURGICAL) ×3
ELECTRODE REM PT RTRN 9FT ADLT (ELECTROSURGICAL) ×1 IMPLANT
FORMALIN 10 PREFIL 120ML (MISCELLANEOUS) ×3 IMPLANT
GLOVE BIOGEL PI IND STRL 7.0 (GLOVE) ×3 IMPLANT
GLOVE BIOGEL PI IND STRL 8 (GLOVE) ×1 IMPLANT
GLOVE BIOGEL PI INDICATOR 7.0 (GLOVE) ×6
GLOVE BIOGEL PI INDICATOR 8 (GLOVE) ×2
GLOVE ECLIPSE 6.5 STRL STRAW (GLOVE) ×3 IMPLANT
GLOVE ECLIPSE 8.0 STRL XLNG CF (GLOVE) ×3 IMPLANT
GLOVE EXAM NITRILE MD LF STRL (GLOVE) ×3 IMPLANT
GOWN STRL REUS W/TWL LRG LVL3 (GOWN DISPOSABLE) ×3 IMPLANT
GOWN STRL REUS W/TWL XL LVL3 (GOWN DISPOSABLE) ×3 IMPLANT
INST SET LAPROSCOPIC GYN AP (KITS) ×3 IMPLANT
KIT ROOM TURNOVER AP CYSTO (KITS) ×3 IMPLANT
NEEDLE INSUFFLATION 14GA 120MM (NEEDLE) ×3 IMPLANT
PACK PERI GYN (CUSTOM PROCEDURE TRAY) ×3 IMPLANT
PAD ARMBOARD 7.5X6 YLW CONV (MISCELLANEOUS) ×3 IMPLANT
SCALPEL HARMONIC ACE (MISCELLANEOUS) ×3 IMPLANT
SET BASIN LINEN APH (SET/KITS/TRAYS/PACK) ×3 IMPLANT
SLEEVE ENDOPATH XCEL 5M (ENDOMECHANICALS) ×3 IMPLANT
SOLUTION ANTI FOG 6CC (MISCELLANEOUS) ×3 IMPLANT
SPONGE GAUZE 2X2 8PLY STER LF (GAUZE/BANDAGES/DRESSINGS) ×3
SPONGE GAUZE 2X2 8PLY STRL LF (GAUZE/BANDAGES/DRESSINGS) ×6 IMPLANT
STAPLER VISISTAT 35W (STAPLE) ×3 IMPLANT
SUT VICRYL 0 UR6 27IN ABS (SUTURE) ×3 IMPLANT
SYR 20CC LL (SYRINGE) ×3 IMPLANT
SYRINGE 10CC LL (SYRINGE) ×3 IMPLANT
TROCAR ENDO BLADELESS 11MM (ENDOMECHANICALS) ×3 IMPLANT
TROCAR XCEL NON-BLD 5MMX100MML (ENDOMECHANICALS) ×6 IMPLANT
TUBING INSUF HEATED (TUBING) ×3 IMPLANT
WARMER LAPAROSCOPE (MISCELLANEOUS) ×3 IMPLANT

## 2014-12-30 NOTE — Anesthesia Procedure Notes (Signed)
Procedure Name: Intubation Date/Time: 12/30/2014 8:43 AM Performed by: Tressie Stalker E Pre-anesthesia Checklist: Patient identified, Patient being monitored, Timeout performed, Emergency Drugs available and Suction available Patient Re-evaluated:Patient Re-evaluated prior to inductionOxygen Delivery Method: Circle System Utilized Preoxygenation: Pre-oxygenation with 100% oxygen Intubation Type: IV induction Ventilation: Mask ventilation without difficulty Laryngoscope Size: Mac and 3 Grade View: Grade I Tube type: Oral Tube size: 7.0 mm Number of attempts: 1 Airway Equipment and Method: Stylet Placement Confirmation: ETT inserted through vocal cords under direct vision,  positive ETCO2 and breath sounds checked- equal and bilateral Secured at: 22 cm Tube secured with: Tape Dental Injury: Teeth and Oropharynx as per pre-operative assessment

## 2014-12-30 NOTE — Anesthesia Postprocedure Evaluation (Signed)
  Anesthesia Post-op Note  Patient: Christina Randall  Procedure(s) Performed: Procedure(s): LAPAROSCOPIC BILATERAL SALPINGECTOMY (Bilateral)  Patient Location: PACU  Anesthesia Type:General  Level of Consciousness: awake, alert  and oriented  Airway and Oxygen Therapy: Patient Spontanous Breathing  Post-op Pain: none  Post-op Assessment: Post-op Vital signs reviewed, Patient's Cardiovascular Status Stable, Respiratory Function Stable, Patent Airway and No signs of Nausea or vomiting              Post-op Vital Signs: Reviewed and stable  Last Vitals:  Filed Vitals:   12/30/14 1030  BP:   Pulse: 61  Temp:   Resp: 14    Complications: No apparent anesthesia complications

## 2014-12-30 NOTE — Anesthesia Preprocedure Evaluation (Addendum)
Anesthesia Evaluation  Patient identified by MRN, date of birth, ID band Patient awake    Reviewed: Allergy & Precautions, Patient's Chart, lab work & pertinent test results  Airway Mallampati: II  TM Distance: >3 FB Neck ROM: Full    Dental  (+) Teeth Intact   Pulmonary asthma ,    Pulmonary exam normal        Cardiovascular hypertension, Pt. on medications Normal cardiovascular exam     Neuro/Psych  Headaches,    GI/Hepatic   Endo/Other  Morbid obesity  Renal/GU      Musculoskeletal  (+) Arthritis , Osteoarthritis,    Abdominal (+) + obese,   Peds  Hematology  (+) anemia ,   Anesthesia Other Findings   Reproductive/Obstetrics                           Anesthesia Physical Anesthesia Plan  ASA: III  Anesthesia Plan: General   Post-op Pain Management:    Induction: Intravenous  Airway Management Planned: Oral ETT  Additional Equipment:   Intra-op Plan:   Post-operative Plan: Extubation in OR  Informed Consent: I have reviewed the patients History and Physical, chart, labs and discussed the procedure including the risks, benefits and alternatives for the proposed anesthesia with the patient or authorized representative who has indicated his/her understanding and acceptance.   Dental advisory given  Plan Discussed with: CRNA  Anesthesia Plan Comments:        Anesthesia Quick Evaluation

## 2014-12-30 NOTE — Op Note (Signed)
Preoperative Diagnosis:  1.  Multiparous female desires permanent sterilization                                          2.  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  Description of Operation:  Patient was taken to the OR and placed into supine position where she underwent general anaesthesia.  She was placed in the dorsal lithotomy position and prepped and draped in the usual sterile fashion.  An incision was made in the umbilicus and dissection taken down to the rectus fascia which was incised and opened.  The non bladed trocar was then placed and the peritoneal cavity was insufflated.  The above noted findings were observed.  Additional trocars were placed in the right and left lower quadrants under direct visualization without difficulty.  The Harmonic scalpel was employed and salpingectomy of both the right and left tubes was performed.   The tubes were removed from the peritoneal cavity and sent to pathology.  There was good hemostasis bilaterally.  The fascia, peritoneum and subcutaneous tissue were closed using 0 vicryl.  The skin was closed using staples.  Exparel 266 mg 20 cc was injected in the 3 incision trocar sites. The patient was awakened from anaesthesia and taken to the PACU with all counts being correct x 3.  The patient received  1 gram of ancef andToradol 30 mg IV preoperatively.  Aamirah Salmi H 12/30/2014 9:39 AM

## 2014-12-30 NOTE — Discharge Instructions (Signed)
Laparoscopic Tubal Ligation, Care After Refer to this sheet in the next few weeks. These instructions provide you with information about caring for yourself after your procedure. Your health care provider may also give you more specific instructions. Your treatment has been planned according to current medical practices, but problems sometimes occur. Call your health care provider if you have any problems or questions after your procedure. WHAT TO EXPECT AFTER THE PROCEDURE After your procedure, it is common to have:  Sore throat.  Soreness at the incision site.  Mild cramping.  Tiredness.  Mild nausea or vomiting.  Shoulder pain. HOME CARE INSTRUCTIONS  Rest for the remainder of the day.  Take medicines only as directed by your health care provider. These include over-the-counter medicines and prescription medicines. Do not take aspirin, which can cause bleeding.  Over the next few days, gradually return to your normal activities and your normal diet.  Avoid sexual intercourse for 2 weeks or as directed by your health care provider.  Do not use tampons, and do not douche.  Do not drive or operate heavy machinery while taking pain medicine.  Do not lift anything that is heavier than 5 lb (2.3 kg) for 2 weeks or as directed by your health care provider.  Do not take baths. Take showers only. Ask your health care provider when you can start taking baths.  Take your temperature twice each day and write it down.  Try to have help for your household needs for the first 7-10 days.  There are many different ways to close and cover an incision, including stitches (sutures), skin glue, and adhesive strips. Follow instructions from your health care provider about:  Incision care.  Bandage (dressing) changes and removal.  Incision closure removal.  Check your incision area every day for signs of infection. Watch for:  Redness, swelling, or pain.  Fluid, blood, or pus.  Keep  all follow-up visits as directed by your health care provider. SEEK MEDICAL CARE IF:  You have redness, swelling, or increasing pain in your incision area.  You have fluid, blood, or pus coming from your incision for longer than 1 day.  You notice a bad smell coming from your incision or your dressing.  The edges of your incision break open after the sutures have been removed.  Your pain does not decrease after 2-3 days.  You have a rash.  You repeatedly become dizzy or light-headed.  You have a reaction to your medicine.  Your pain medicine is not helping.  You are constipated. SEEK IMMEDIATE MEDICAL CARE IF:  You have a fever.  You faint.  You have increasing pain in your abdomen.  You have severe pain in one or both of your shoulders.  You have bleeding or drainage from your suture sites or your vagina after surgery.  You have shortness of breath or have difficulty breathing.  You have chest pain or leg pain.  You have ongoing nausea, vomiting, or diarrhea.   This information is not intended to replace advice given to you by your health care provider. Make sure you discuss any questions you have with your health care provider.   Document Released: 09/09/2004 Document Revised: 07/07/2014 Document Reviewed: 06/03/2011 Elsevier Interactive Patient Education [employer].

## 2014-12-30 NOTE — Transfer of Care (Signed)
Immediate Anesthesia Transfer of Care Note  Patient: Christina Randall  Procedure(s) Performed: Procedure(s): LAPAROSCOPIC BILATERAL SALPINGECTOMY (Bilateral)  Patient Location: PACU  Anesthesia Type:General  Level of Consciousness: awake  Airway & Oxygen Therapy: Patient Spontanous Breathing and Patient connected to face mask oxygen  Post-op Assessment: Report given to RN  Post vital signs: Reviewed and stable  Last Vitals:  Filed Vitals:   12/30/14 0825  BP: 126/86  Pulse:   Temp:   Resp: 18    Complications: No apparent anesthesia complications

## 2014-12-30 NOTE — H&P (Signed)
Preoperative History and Physical  Christina Randall is a 29 y.o. [phone] with Patient's last menstrual period was 12/17/2014. admitted for a laparoscopic bilateral salpingectomy for sterilization.    PMH:    Past Medical History  Diagnosis Date  . Herpes   . No pertinent past medical history   . Asthma   . Back pain   . Scoliosis   . Frequent headaches   . Herniated disc   . Pregnant 02/09/2014  . Spotting during pregnancy in first trimester 02/09/2014  . Hypertension   . Anemia     PSH:     Past Surgical History  Procedure Laterality Date  . No past surgeries    . Anal fissure repair      POb/GynH:      OB History    Gravida Para Term Preterm AB TAB SAB Ectopic Multiple Living   4 3 3  0 1 1 0 0 0 3      SH:   Social History  Substance Use Topics  . Smoking status: Never Smoker   . Smokeless tobacco: Never Used  . Alcohol Use: No    FH:    Family History  Problem Relation Age of Onset  . Hypertension Father   . Diabetes Maternal Grandmother   . Cancer Maternal Grandmother     pancreatic  . Cancer Maternal Grandfather     lungs     Allergies: No Known Allergies  Medications:       Current facility-administered medications:  .  ceFAZolin (ANCEF) IVPB 2 g/50 mL premix, 2 g, Intravenous, On Call to OR, Florian Buff, MD .  lactated ringers infusion, , Intravenous, Continuous, Rusty Aus, MD, Last Rate: 75 mL/hr at 12/30/14 0808 .  midazolam (VERSED) injection 1-2 mg, 1-2 mg, Intravenous, Q5 min PRN, Rusty Aus, MD, 2 mg at 12/30/14 3300  Review of Systems:   Review of Systems  Constitutional: Negative for fever, chills, weight loss, malaise/fatigue and diaphoresis.  HENT: Negative for hearing loss, ear pain, nosebleeds, congestion, sore throat, neck pain, tinnitus and ear discharge.   Eyes: Negative for blurred vision, double vision, photophobia, pain, discharge and redness.  Respiratory: Negative for cough, hemoptysis, sputum  production, shortness of breath, wheezing and stridor.   Cardiovascular: Negative for chest pain, palpitations, orthopnea, claudication, leg swelling and PND.  Gastrointestinal: Positive for abdominal pain. Negative for heartburn, nausea, vomiting, diarrhea, constipation, blood in stool and melena.  Genitourinary: Negative for dysuria, urgency, frequency, hematuria and flank pain.  Musculoskeletal: Negative for myalgias, back pain, joint pain and falls.  Skin: Negative for itching and rash.  Neurological: Negative for dizziness, tingling, tremors, sensory change, speech change, focal weakness, seizures, loss of consciousness, weakness and headaches.  Endo/Heme/Allergies: Negative for environmental allergies and polydipsia. Does not bruise/bleed easily.  Psychiatric/Behavioral: Negative for depression, suicidal ideas, hallucinations, memory loss and substance abuse. The patient is not nervous/anxious and does not have insomnia.      PHYSICAL EXAM:  Blood pressure 126/84, pulse 65, temperature 98.1 F (36.7 C), temperature source Oral, resp. rate 19, height 5\' 8"  (1.727 m), weight 305 lb (138.347 kg), last menstrual period 12/17/2014, SpO2 100 %, not currently breastfeeding.    Vitals reviewed. Constitutional: She is oriented to person, place, and time. She appears well-developed and well-nourished.  HENT:  Head: Normocephalic and atraumatic.  Right Ear: External ear normal.  Left Ear: External ear normal.  Nose: Nose normal.  Mouth/Throat: Oropharynx is clear and moist.  Eyes: Conjunctivae  and EOM are normal. Pupils are equal, round, and reactive to light. Right eye exhibits no discharge. Left eye exhibits no discharge. No scleral icterus.  Neck: Normal range of motion. Neck supple. No tracheal deviation present. No thyromegaly present.  Cardiovascular: Normal rate, regular rhythm, normal heart sounds and intact distal pulses.  Exam reveals no gallop and no friction rub.   No murmur  heard. Respiratory: Effort normal and breath sounds normal. No respiratory distress. She has no wheezes. She has no rales. She exhibits no tenderness.  GI: Soft. Bowel sounds are normal. She exhibits no distension and no mass. There is tenderness. There is no rebound and no guarding.  Genitourinary:       Vulva is normal without lesions Vagina is pink moist without discharge Cervix normal in appearance and pap is normal Uterus is NORMAL SIZE SHAPE AND CONTOUR Adnexa is negative with normal sized ovaries by sonogram  Musculoskeletal: Normal range of motion. She exhibits no edema and no tenderness.  Neurological: She is alert and oriented to person, place, and time. She has normal reflexes. She displays normal reflexes. No cranial nerve deficit. She exhibits normal muscle tone. Coordination normal.  Skin: Skin is warm and dry. No rash noted. No erythema. No pallor.  Psychiatric: She has a normal mood and affect. Her behavior is normal. Judgment and thought content normal.    Labs: Results for orders placed or performed during the hospital encounter of 12/23/14 (from the past 336 hour(s))  CBC   Collection Time: 12/23/14  8:44 AM  Result Value Ref Range   WBC 4.5 4.0 - 10.5 K/uL   RBC 4.51 3.87 - 5.11 MIL/uL   Hemoglobin 10.9 (L) 12.0 - 15.0 g/dL   HCT 34.1 (L) 36.0 - 46.0 %   MCV 75.6 (L) 78.0 - 100.0 fL   MCH 24.2 (L) 26.0 - 34.0 pg   MCHC 32.0 30.0 - 36.0 g/dL   RDW 17.4 (H) 11.5 - 15.5 %   Platelets 285 150 - 400 K/uL  Comprehensive metabolic panel   Collection Time: 12/23/14  8:44 AM  Result Value Ref Range   Sodium 138 135 - 145 mmol/L   Potassium 3.8 3.5 - 5.1 mmol/L   Chloride 107 101 - 111 mmol/L   CO2 25 22 - 32 mmol/L   Glucose, Bld 93 65 - 99 mg/dL   BUN 9 6 - 20 mg/dL   Creatinine, Ser 0.75 0.44 - 1.00 mg/dL   Calcium 8.8 (L) 8.9 - 10.3 mg/dL   Total Protein 6.8 6.5 - 8.1 g/dL   Albumin 3.4 (L) 3.5 - 5.0 g/dL   AST 21 15 - 41 U/L   ALT 20 14 - 54 U/L   Alkaline  Phosphatase 61 38 - 126 U/L   Total Bilirubin 0.3 0.3 - 1.2 mg/dL   GFR calc non Af Amer >60 >60 mL/min   GFR calc Af Amer >60 >60 mL/min   Anion gap 6 5 - 15  hCG, quantitative, pregnancy   Collection Time: 12/23/14  8:44 AM  Result Value Ref Range   hCG, Beta Chain, Quant, S <1 <5 mIU/mL    EKG: Orders placed or performed in visit on 12/23/14  . EKG 12-Lead  . EKG 12-Lead    Imaging Studies: No results found.    Assessment: Multiparous female desires permanent sterilization Patient Active Problem List   Diagnosis Date Noted  . 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 Uterine fibroid during pregnancy, antepartum 03/31/2014  . Opiate dependence (  [place]) 03/31/2014  . H N P-LUMBAR 01/05/2009  . DEGENERATIVE JOINT DISEASE, KNEE 11/26/2008  . DERANGEMENT MENISCUS 11/26/2008  . 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 BACK PAIN 11/26/2008    Plan: Laparoscopic bilateral salpingectomy for sterilization  Oasis Goehring H 12/30/2014 8:17 AM

## 2014-12-30 NOTE — Addendum Note (Signed)
Addendum  created 12/30/14 1520 by Ollen Bowl, CRNA   Modules edited: Charges VN

## 2014-12-31 ENCOUNTER — Encounter (HOSPITAL_COMMUNITY): Payer: Self-pay | Admitting: Obstetrics & Gynecology

## 2015-01-06 NOTE — Progress Notes (Signed)
Patient ID: Christina Randall, female   DOB: 06-Feb-1986, 29 y.o.   MRN: [id number] Preoperative History and Physical  Christina Randall is a 29 y.o. [phone] with Patient's last menstrual period was 11/19/2014. admitted for a salpingectomy for sterilization.    PMH:    Past Medical History  Diagnosis Date  . Herpes   . No pertinent past medical history   . Asthma   . Back pain   . Scoliosis   . Frequent headaches   . Herniated disc   . Pregnant 02/09/2014  . Spotting during pregnancy in first trimester 02/09/2014  . Hypertension   . Anemia     PSH:     Past Surgical History  Procedure Laterality Date  . No past surgeries    . Anal fissure repair    . Laparoscopic bilateral salpingectomy Bilateral 12/30/2014    Procedure: LAPAROSCOPIC BILATERAL SALPINGECTOMY;  Surgeon: Christina Buff, MD;  Location: [facility];  Service: Gynecology;  Laterality: Bilateral;    POb/GynH:      OB History    Gravida Para Term Preterm AB TAB SAB Ectopic Multiple Living   4 3 3  0 1 1 0 0 0 3      SH:   Social History  Substance Use Topics  . Smoking status: Never Smoker   . Smokeless tobacco: Never Used  . Alcohol Use: No    FH:    Family History  Problem Relation Age of Onset  . Hypertension Father   . Diabetes Maternal Grandmother   . Cancer Maternal Grandmother     pancreatic  . Cancer Maternal Grandfather     lungs     Allergies: No Known Allergies  Medications:       Current outpatient prescriptions:  .  HYDROcodone-acetaminophen (NORCO) 10-325 MG per tablet, TK 1 T PO UP TO QID PRF LOW BACK PAIN, Disp: , Rfl: 0 .  acetaminophen (TYLENOL) 500 MG tablet, Take 500 mg by mouth every 6 (six) hours as needed for moderate pain. , Disp: , Rfl:  .  amLODipine (NORVASC) 2.5 MG tablet, Take 1 tablet (2.5 mg total) by mouth daily., Disp: 30 tablet, Rfl: 0 .  HYDROcodone-acetaminophen (NORCO/VICODIN) 5-325 MG tablet, Take 1 tablet by mouth every 6 (six) hours as needed., Disp: 30 tablet, Rfl:  0 .  ketorolac (TORADOL) 10 MG tablet, Take 1 tablet (10 mg total) by mouth every 8 (eight) hours as needed., Disp: 15 tablet, Rfl: 0 .  ondansetron (ZOFRAN) 8 MG tablet, Take 1 tablet (8 mg total) by mouth every 8 (eight) hours as needed for nausea., Disp: 12 tablet, Rfl: 0 .  SUMAtriptan (IMITREX) 50 MG tablet, Take 1 tablet (50 mg total) by mouth every 2 (two) hours as needed for migraine. May repeat in 2 hours if headache persists or recurs. No more than 2 doses., Disp: 10 tablet, Rfl: 0  Review of Systems:   Review of Systems  Constitutional: Negative for fever, chills, weight loss, malaise/fatigue and diaphoresis.  HENT: Negative for hearing loss, ear pain, nosebleeds, congestion, sore throat, neck pain, tinnitus and ear discharge.   Eyes: Negative for blurred vision, double vision, photophobia, pain, discharge and redness.  Respiratory: Negative for cough, hemoptysis, sputum production, shortness of breath, wheezing and stridor.   Cardiovascular: Negative for chest pain, palpitations, orthopnea, claudication, leg swelling and PND.  Gastrointestinal: Positive for abdominal pain. Negative for heartburn, nausea, vomiting, diarrhea, constipation, blood in stool and melena.  Genitourinary: Negative for dysuria, urgency, frequency,  hematuria and flank pain.  Musculoskeletal: Negative for myalgias, back pain, joint pain and falls.  Skin: Negative for itching and rash.  Neurological: Negative for dizziness, tingling, tremors, sensory change, speech change, focal weakness, seizures, loss of consciousness, weakness and headaches.  Endo/Heme/Allergies: Negative for environmental allergies and polydipsia. Does not bruise/bleed easily.  Psychiatric/Behavioral: Negative for depression, suicidal ideas, hallucinations, memory loss and substance abuse. The patient is not nervous/anxious and does not have insomnia.      PHYSICAL EXAM:  Blood pressure 142/80, pulse 76, weight 305 lb (138.347 kg), last  menstrual period 11/19/2014, not currently breastfeeding.    Vitals reviewed. Constitutional: She is oriented to person, place, and time. She appears well-developed and well-nourished.  HENT:  Head: Normocephalic and atraumatic.  Right Ear: External ear normal.  Left Ear: External ear normal.  Nose: Nose normal.  Mouth/Throat: Oropharynx is clear and moist.  Eyes: Conjunctivae and EOM are normal. Pupils are equal, round, and reactive to light. Right eye exhibits no discharge. Left eye exhibits no discharge. No scleral icterus.  Neck: Normal range of motion. Neck supple. No tracheal deviation present. No thyromegaly present.  Cardiovascular: Normal rate, regular rhythm, normal heart sounds and intact distal pulses.  Exam reveals no gallop and no friction rub.   No murmur heard. Respiratory: Effort normal and breath sounds normal. No respiratory distress. She has no wheezes. She has no rales. She exhibits no tenderness.  GI: Soft. Bowel sounds are normal. She exhibits no distension and no mass. There is tenderness. There is no rebound and no guarding.  Genitourinary:       Vulva is normal without lesions Vagina is pink moist without discharge Cervix normal in appearance and pap is normal Uterus is normal size, contour, position, consistency, mobility, non-tender Adnexa is negative with normal sized ovaries by sonogram  Musculoskeletal: Normal range of motion. She exhibits no edema and no tenderness.  Neurological: She is alert and oriented to person, place, and time. She has normal reflexes. She displays normal reflexes. No cranial nerve deficit. She exhibits normal muscle tone. Coordination normal.  Skin: Skin is warm and dry. No rash noted. No erythema. No pallor.  Psychiatric: She has a normal mood and affect. Her behavior is normal. Judgment and thought content normal.    Labs: No results found for this or any previous visit (from the past 336 hour(s)).  EKG: Orders placed or  performed during the hospital encounter of 03/01/14  . ED EKG  . ED EKG  . EKG 12-Lead  . EKG 12-Lead  . EKG    Imaging Studies: No results found.    Assessment: Multiparous desires sterilization Patient Active Problem List   Diagnosis Date Noted  . 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 Uterine fibroid during pregnancy, antepartum 03/31/2014  . Opiate dependence ([place]) 03/31/2014  . H N P-LUMBAR 01/05/2009  . DEGENERATIVE JOINT DISEASE, KNEE 11/26/2008  . DERANGEMENT MENISCUS 11/26/2008  . 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 BACK PAIN 11/26/2008    Plan: Laparoscopic salpingectomy  Olan Kurek H 01/06/2015 9:58 AM

## 2015-01-11 ENCOUNTER — Encounter: Payer: Self-pay | Admitting: Obstetrics & Gynecology

## 2015-01-11 ENCOUNTER — Ambulatory Visit (INDEPENDENT_AMBULATORY_CARE_PROVIDER_SITE_OTHER): Payer: Medicaid Other | Admitting: Obstetrics & Gynecology

## 2015-01-11 VITALS — BP 120/80 | HR 96 | Ht 68.0 in | Wt 305.0 lb

## 2015-01-11 DIAGNOSIS — Z9889 Other specified postprocedural states: Secondary | ICD-10-CM

## 2015-01-11 NOTE — Progress Notes (Signed)
Patient ID: Christina Randall, female   DOB: 1985/07/07, 29 y.o.   MRN: [id number]  HPI: Patient returns for routine postoperative follow-up having undergone laparoscopic salpingectomy for sterilization on 10/26.  The patient's immediate postoperative recovery has been unremarkable. Since hospital discharge the patient reports no problems.   Current Outpatient Prescriptions: acetaminophen (TYLENOL) 500 MG tablet, Take 500 mg by mouth every 6 (six) hours as needed for moderate pain. , Disp: , Rfl:  HYDROcodone-acetaminophen (NORCO) 10-325 MG per tablet, TK 1 T PO UP TO QID PRF LOW BACK PAIN, Disp: , Rfl: 0 ketorolac (TORADOL) 10 MG tablet, Take 1 tablet (10 mg total) by mouth every 8 (eight) hours as needed., Disp: 15 tablet, Rfl: 0  No current facility-administered medications for this visit.    Blood pressure 120/80, pulse 96, height 5\' 8"  (1.727 m), weight 305 lb (138.347 kg), not currently breastfeeding.  Physical Exam: Incisions x 3 healing well staples removed  Diagnostic Tests: none  Pathology: benign  Impression: S/p salpingectomy for sterilization  Plan:   Follow up: 1  years  Florian Buff, MD

## 2015-01-21 ENCOUNTER — Emergency Department (HOSPITAL_COMMUNITY)
Admission: EM | Admit: 2015-01-21 | Discharge: 2015-01-21 | Disposition: A | Discharge status: Home / Self Care | Payer: Medicaid Other | Attending: Emergency Medicine | Admitting: Emergency Medicine

## 2015-01-21 ENCOUNTER — Encounter (HOSPITAL_COMMUNITY): Payer: Self-pay

## 2015-01-21 DIAGNOSIS — J029 Acute pharyngitis, unspecified: Secondary | ICD-10-CM

## 2015-01-21 DIAGNOSIS — I10 Essential (primary) hypertension: Secondary | ICD-10-CM | POA: Diagnosis not present

## 2015-01-21 DIAGNOSIS — Z862 Personal history of diseases of the blood and blood-forming organs and certain disorders involving the immune mechanism: Secondary | ICD-10-CM | POA: Insufficient documentation

## 2015-01-21 DIAGNOSIS — M791 Myalgia: Secondary | ICD-10-CM | POA: Diagnosis not present

## 2015-01-21 DIAGNOSIS — Z8619 Personal history of other infectious and parasitic diseases: Secondary | ICD-10-CM | POA: Diagnosis not present

## 2015-01-21 DIAGNOSIS — J45909 Unspecified asthma, uncomplicated: Secondary | ICD-10-CM | POA: Diagnosis not present

## 2015-01-21 DIAGNOSIS — R51 Headache: Secondary | ICD-10-CM | POA: Diagnosis not present

## 2015-01-21 DIAGNOSIS — Z20818 Contact with and (suspected) exposure to other bacterial communicable diseases: Secondary | ICD-10-CM | POA: Insufficient documentation

## 2015-01-21 DIAGNOSIS — Z8719 Personal history of other diseases of the digestive system: Secondary | ICD-10-CM | POA: Insufficient documentation

## 2015-01-21 LAB — RAPID STREP SCREEN (MED CTR MEBANE ONLY): Streptococcus, Group A Screen (Direct): NEGATIVE

## 2015-01-21 MED ORDER — AMOXICILLIN 500 MG PO CAPS: 500.0000 mg | ORAL_CAPSULE | Freq: Three times a day (TID) | ORAL | Status: AC

## 2015-01-21 NOTE — Discharge Instructions (Signed)

## 2015-01-21 NOTE — ED Notes (Signed)
Pt c/o sore throat, headache, and body aches x 4 days.

## 2015-01-23 LAB — CULTURE, GROUP A STREP: Strep A Culture: NEGATIVE

## 2015-01-23 NOTE — ED Provider Notes (Signed)
CSN: [id number]     Arrival date & time 01/21/15  1005 History   First MD Initiated Contact with Patient 01/21/15 1052     Chief Complaint  Patient presents with  . Sore Throat     (Consider location/radiation/quality/duration/timing/severity/associated sxs/prior Treatment) Patient is a 29 y.o. female presenting with pharyngitis. The history is provided by the patient.  Sore Throat This is a new problem. Episode onset: 4 days. The problem occurs constantly. The problem has been unchanged. Associated symptoms include headaches, myalgias, a sore throat and swollen glands. Pertinent negatives include no abdominal pain, congestion, coughing, diaphoresis, fever, nausea, rash, urinary symptoms or vomiting. The symptoms are aggravated by swallowing. She has tried NSAIDs for the symptoms. The treatment provided mild relief.   Pt with positive exposure to several coworkers with strep throat.   Past Medical History  Diagnosis Date  . Herpes   . No pertinent past medical history   . Asthma   . Back pain   . Scoliosis   . Frequent headaches   . Herniated disc   . Pregnant 02/09/2014  . Spotting during pregnancy in first trimester 02/09/2014  . Hypertension   . Anemia    Past Surgical History  Procedure Laterality Date  . No past surgeries    . Anal fissure repair    . Laparoscopic bilateral salpingectomy Bilateral 12/30/2014    Procedure: LAPAROSCOPIC BILATERAL SALPINGECTOMY;  Surgeon: Florian Buff, MD;  Location: [facility];  Service: Gynecology;  Laterality: Bilateral;   Family History  Problem Relation Age of Onset  . Hypertension Father   . Diabetes Maternal Grandmother   . Cancer Maternal Grandmother     pancreatic  . Cancer Maternal Grandfather     lungs   Social History  Substance Use Topics  . Smoking status: Never Smoker   . Smokeless tobacco: Never Used  . Alcohol Use: No   OB History    Gravida Para Term Preterm AB TAB SAB Ectopic Multiple Living   4 3 3  0 1 1 0 0 0  3     Review of Systems  Constitutional: Negative for fever and diaphoresis.  HENT: Positive for sore throat. Negative for congestion.   Respiratory: Negative for cough.   Gastrointestinal: Negative for nausea, vomiting and abdominal pain.  Musculoskeletal: Positive for myalgias.  Skin: Negative for rash.  Neurological: Positive for headaches.      Allergies  Review of patient's allergies indicates no known allergies.  Home Medications   Prior to Admission medications   Medication Sig Start Date End Date Taking? Authorizing Provider  HYDROcodone-acetaminophen (NORCO) 10-325 MG per tablet TK 1 T PO UP TO QID PRF LOW BACK PAIN 11/04/14  Yes Historical Provider, MD  ibuprofen (ADVIL,MOTRIN) 800 MG tablet TK 1 T PO TID FOR INFLAMMATION AND OR PAIN 01/18/15  Yes Historical Provider, MD  amoxicillin (AMOXIL) 500 MG capsule Take 1 capsule (500 mg total) by mouth 3 (three) times daily. 01/21/15 01/31/15  Evalee Jefferson, PA-C  ketorolac (TORADOL) 10 MG tablet Take 1 tablet (10 mg total) by mouth every 8 (eight) hours as needed. Patient not taking: Reported on 01/21/2015 12/30/14   Florian Buff, MD   BP 139/89 mmHg  Pulse 80  Temp(Src) 98.2 F (36.8 C) (Oral)  Resp 15  Ht 5\' 8"  (1.727 m)  Wt 300 lb (136.079 kg)  BMI 45.63 kg/m2  SpO2 100%  LMP 01/14/2015  Breastfeeding? No Physical Exam  Constitutional: She is oriented to person, place, and  time. She appears well-developed and well-nourished.  HENT:  Head: Normocephalic and atraumatic.  Right Ear: Tympanic membrane and ear canal normal.  Left Ear: Tympanic membrane and ear canal normal.  Nose: No mucosal edema or rhinorrhea.  Mouth/Throat: Uvula is midline and mucous membranes are normal. Posterior oropharyngeal erythema present. No oropharyngeal exudate, posterior oropharyngeal edema or tonsillar abscesses.  Eyes: Conjunctivae are normal.  Cardiovascular: Normal rate and normal heart sounds.   Pulmonary/Chest: Effort normal. No  respiratory distress. She has no wheezes. She has no rales.  Abdominal: Soft. There is no tenderness.  Musculoskeletal: Normal range of motion.  Lymphadenopathy:       Head (right side): Tonsillar adenopathy present.  Neurological: She is alert and oriented to person, place, and time.  Skin: Skin is warm and dry. No rash noted.  Psychiatric: She has a normal mood and affect.    ED Course  Procedures (including critical care time) Labs Review Labs Reviewed  RAPID STREP SCREEN (NOT AT [facility])  CULTURE, GROUP A STREP    Imaging Review No results found. I have personally reviewed and evaluated these images and lab results as part of my medical decision-making.   EKG Interpretation None      MDM   Final diagnoses:  Pharyngitis  Strep throat exposure    Negative strep,but with positive exposure will tx. Amoxil, first dose given here. Culture pending.  Advised motrin, rest, gargles. Prn f/u with pcp if sx persist or worsen.    Evalee Jefferson, PA-C 01/23/15 [place], MD 01/28/15 [phone]

## 2015-03-29 ENCOUNTER — Other Ambulatory Visit: Payer: Medicaid Other | Admitting: Adult Health

## 2016-04-30 ENCOUNTER — Emergency Department (HOSPITAL_COMMUNITY): Payer: BLUE CROSS/BLUE SHIELD

## 2016-04-30 ENCOUNTER — Emergency Department (HOSPITAL_COMMUNITY)
Admission: EM | Admit: 2016-04-30 | Discharge: 2016-05-01 | Disposition: A | Discharge status: Home / Self Care | Payer: BLUE CROSS/BLUE SHIELD | Attending: Emergency Medicine | Admitting: Emergency Medicine

## 2016-04-30 ENCOUNTER — Encounter (HOSPITAL_COMMUNITY): Payer: Self-pay | Admitting: Emergency Medicine

## 2016-04-30 DIAGNOSIS — I10 Essential (primary) hypertension: Secondary | ICD-10-CM | POA: Insufficient documentation

## 2016-04-30 DIAGNOSIS — J45909 Unspecified asthma, uncomplicated: Secondary | ICD-10-CM | POA: Diagnosis not present

## 2016-04-30 DIAGNOSIS — G8929 Other chronic pain: Secondary | ICD-10-CM | POA: Diagnosis not present

## 2016-04-30 DIAGNOSIS — M545 Low back pain, unspecified: Secondary | ICD-10-CM

## 2016-04-30 LAB — URINALYSIS, ROUTINE W REFLEX MICROSCOPIC
Bilirubin Urine: NEGATIVE
Glucose, UA: NEGATIVE mg/dL
HGB URINE DIPSTICK: NEGATIVE
Ketones, ur: 5 mg/dL — AB
Nitrite: NEGATIVE
PH: 6 (ref 5.0–8.0)
Protein, ur: 30 mg/dL — AB
SPECIFIC GRAVITY, URINE: 1.03 (ref 1.005–1.030)

## 2016-04-30 LAB — PREGNANCY, URINE: PREG TEST UR: NEGATIVE

## 2016-04-30 MED ORDER — PREDNISONE 10 MG PO TABS: ORAL_TABLET | ORAL | 0 refills | Status: DC

## 2016-04-30 MED ORDER — METHOCARBAMOL 500 MG PO TABS
500.0000 mg | ORAL_TABLET | Freq: Once | ORAL | Status: AC
  Administered 2016-04-30: 500 mg via ORAL
  Filled 2016-04-30: qty 1

## 2016-04-30 MED ORDER — DEXAMETHASONE SODIUM PHOSPHATE 4 MG/ML IJ SOLN
10.0000 mg | Freq: Once | INTRAMUSCULAR | Status: AC
  Administered 2016-04-30: 10 mg via INTRAMUSCULAR
  Filled 2016-04-30: qty 3

## 2016-04-30 MED ORDER — METHOCARBAMOL 500 MG PO TABS: 500.0000 mg | ORAL_TABLET | Freq: Three times a day (TID) | ORAL | 0 refills | Status: DC

## 2016-04-30 NOTE — ED Triage Notes (Signed)
Pt has long history of chronic back pain.  For 2 weeks, having back pain that is similar to pain she normally has however her pain medications are not working

## 2016-04-30 NOTE — ED Provider Notes (Signed)
[facility] DEPT Provider Note   CSN: [id number] Arrival date & time: 04/30/16  2149     History   Chief Complaint Chief Complaint  Patient presents with  . Back Pain    HPI Christina Randall is a 31 y.o. female.  HPI   Christina Randall is a 31 y.o. female who presents to the Emergency Department complaining of worsening of her chronic low back pain for 2 weeks.  She states that she takes hydrocodone daily along with ibuprofen, but the medication is not controlling her pain.  She reports hx of herniated disks to her lower back.  Pain is persistent and worsens with certain movements.  She also complains of constipation, with last BM two days ago.  She denies urine incontinence or retention, fever, chills, abdominal pain, vomiting or recent injury.  Pain to her lower back feels similar to previous.   Past Medical History:  Diagnosis Date  . Anemia   . Asthma   . Back pain   . Frequent headaches   . Herniated disc   . Herpes   . Hypertension   . No pertinent past medical history   . Pregnant 02/09/2014  . Scoliosis   . Spotting during pregnancy in first trimester 02/09/2014    Patient Active Problem List   Diagnosis Date Noted  . 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 Uterine fibroid during pregnancy, antepartum 03/31/2014  . Opiate dependence ([place]) 03/31/2014  . H N P-LUMBAR 01/05/2009  . DEGENERATIVE JOINT DISEASE, KNEE 11/26/2008  . DERANGEMENT MENISCUS 11/26/2008  . 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 BACK PAIN 11/26/2008    Past Surgical History:  Procedure Laterality Date  . ANAL FISSURE REPAIR    . LAPAROSCOPIC BILATERAL SALPINGECTOMY Bilateral 12/30/2014   Procedure: LAPAROSCOPIC BILATERAL SALPINGECTOMY;  Surgeon: Florian Buff, MD;  Location: [facility];  Service: Gynecology;  Laterality: Bilateral;  . NO PAST SURGERIES      OB History    Gravida Para Term Preterm AB Living   4 3 3  0 1 3   SAB TAB Ectopic Multiple Live Births   0 1 0 0 3       Home  Medications    Prior to Admission medications   Medication Sig Start Date End Date Taking? Authorizing Provider  HYDROcodone-acetaminophen (NORCO) 10-325 MG per tablet TK 1 T PO UP TO QID PRF LOW BACK PAIN 11/04/14   Historical Provider, MD  ibuprofen (ADVIL,MOTRIN) 800 MG tablet TK 1 T PO TID FOR INFLAMMATION AND OR PAIN 01/18/15   Historical Provider, MD  ketorolac (TORADOL) 10 MG tablet Take 1 tablet (10 mg total) by mouth every 8 (eight) hours as needed. Patient not taking: Reported on 01/21/2015 12/30/14   Florian Buff, MD    Family History Family History  Problem Relation Age of Onset  . Hypertension Father   . Diabetes Maternal Grandmother   . Cancer Maternal Grandmother     pancreatic  . Cancer Maternal Grandfather     lungs    Social History Social History  Substance Use Topics  . Smoking status: Never Smoker  . Smokeless tobacco: Never Used  . Alcohol use No     Allergies   Patient has no known allergies.   Review of Systems Review of Systems  Constitutional: Negative for fever.  Respiratory: Negative for shortness of breath.   Gastrointestinal: Negative for abdominal pain, constipation and vomiting.  Genitourinary: Negative for decreased urine volume, difficulty urinating, dysuria, flank pain and hematuria.  Musculoskeletal: Positive for back pain. Negative for joint swelling.  Skin: Negative for rash.  Neurological: Negative for weakness and numbness.  All other systems reviewed and are negative.    Physical Exam Updated Vital Signs BP 135/79 (BP Location: Right Arm)   Pulse 103   Temp 98.4 F (36.9 C) (Oral)   Resp 18   Ht 5\' 8"  (1.727 m)   Wt 128.4 kg   SpO2 99%   BMI 43.03 kg/m   Physical Exam  Constitutional: She is oriented to person, place, and time. She appears well-developed and well-nourished. No distress.  HENT:  Head: Normocephalic and atraumatic.  Neck: Normal range of motion. Neck supple.  Cardiovascular: Normal rate, regular  rhythm and intact distal pulses.   No murmur heard. Pulmonary/Chest: Effort normal and breath sounds normal. No respiratory distress.  Abdominal: Soft. She exhibits no distension and no mass. There is no tenderness. There is no guarding.  Musculoskeletal: She exhibits tenderness. She exhibits no edema.       Lumbar back: She exhibits tenderness and pain. She exhibits normal range of motion, no swelling, no deformity, no laceration and normal pulse.  ttp of the lower lumbar spine and bilateral paraspinal muscles.  DP pulses are brisk and symmetrical.  Distal sensation intact.  Pt has 5/5 strength against resistance of bilateral lower extremities.     Neurological: She is alert and oriented to person, place, and time. She has normal strength. No sensory deficit. She exhibits normal muscle tone. Coordination and gait normal.  Reflex Scores:      Patellar reflexes are 2+ on the right side and 2+ on the left side.      Achilles reflexes are 2+ on the right side and 2+ on the left side. Skin: Skin is warm and dry. No rash noted.  Nursing note and vitals reviewed.    ED Treatments / Results  Labs (all labs ordered are listed, but only abnormal results are displayed) Labs Reviewed  URINALYSIS, ROUTINE W REFLEX MICROSCOPIC - Abnormal; Notable for the following:       Result Value   APPearance HAZY (*)    Ketones, ur 5 (*)    Protein, ur 30 (*)    Leukocytes, UA TRACE (*)    Bacteria, UA RARE (*)    All other components within normal limits  PREGNANCY, URINE    EKG  EKG Interpretation None       Radiology Dg Lumbar Spine Complete  Result Date: 04/30/2016 CLINICAL DATA:  Initial evaluation for chronic low back pain intermittently radiating down both legs. EXAM: LUMBAR SPINE - COMPLETE 4+ VIEW COMPARISON:  Prior radiograph from 09/02/2013. FINDINGS: Mild levoscoliosis with straightening of the normal lumbar lordosis, stable. No interval listhesis or malalignment. Vertebral body heights  are maintained. No evidence for acute fracture. Visualized sacrum intact. Degenerative intervertebral disc space narrowing present at L4-5, similar to previous. Minimal degenerative endplate changes present at T12-L1 and L1-2 as well. Mild facet arthrosis at L5-S1. No soft tissue abnormality. IMPRESSION: 1. No radiographic evidence for acute abnormality within the lumbar spine. 2. Degenerative intervertebral disc space narrowing at L4-5, similar to previous. Electronically Signed   By: Jeannine Boga M.D.   On: 04/30/2016 23:43    Procedures Procedures (including critical care time)  Medications Ordered in ED Medications  dexamethasone (DECADRON) injection 10 mg (not administered)  methocarbamol (ROBAXIN) tablet 500 mg (not administered)     Initial Impression / Assessment and Plan / ED Course  I have  reviewed the triage vital signs and the nursing notes.  Pertinent labs & imaging results that were available during my care of the patient were reviewed by me and considered in my medical decision making (see chart for details).     Pt reviewed on the [place], received #120 hydrocodone-acetaminophen 10/325mg  on 04/14/16  Pt is well appearing, vitals stable.  No focal neuro deficits on exam.  No concerning sx's for emergent neurological or infectious process.  Appears stable for d/c.  Rx written for robaxin and prednisone taper.  Agrees to PCP f/u, return precautions discussed.   Final Clinical Impressions(s) / ED Diagnoses   Final diagnoses:  Chronic midline low back pain without sciatica    New Prescriptions New Prescriptions   No medications on file     Bufford Lope 05/01/16 0008    Merrily Pew, MD 05/01/16 [phone]

## 2016-04-30 NOTE — Discharge Instructions (Signed)
Continue your current pain medication as directed.  You can try taking a stool softener such  as Colace daily to help with constipation.  Follow-up with your primary doctor for recheck

## 2016-05-01 NOTE — ED Notes (Signed)
Pt states understanding of care given and follow up instructions.  Pt a/o, ambulated from ED with steady gait  

## 2016-07-01 ENCOUNTER — Encounter (HOSPITAL_COMMUNITY): Payer: Self-pay | Admitting: *Deleted

## 2016-07-01 ENCOUNTER — Emergency Department (HOSPITAL_COMMUNITY)
Admission: EM | Admit: 2016-07-01 | Discharge: 2016-07-02 | Disposition: A | Discharge status: Home / Self Care | Payer: BLUE CROSS/BLUE SHIELD | Attending: Emergency Medicine | Admitting: Emergency Medicine

## 2016-07-01 DIAGNOSIS — J029 Acute pharyngitis, unspecified: Secondary | ICD-10-CM | POA: Insufficient documentation

## 2016-07-01 DIAGNOSIS — J45909 Unspecified asthma, uncomplicated: Secondary | ICD-10-CM | POA: Diagnosis not present

## 2016-07-01 DIAGNOSIS — Z79899 Other long term (current) drug therapy: Secondary | ICD-10-CM | POA: Insufficient documentation

## 2016-07-01 DIAGNOSIS — I10 Essential (primary) hypertension: Secondary | ICD-10-CM | POA: Insufficient documentation

## 2016-07-01 LAB — RAPID STREP SCREEN (MED CTR MEBANE ONLY): Streptococcus, Group A Screen (Direct): NEGATIVE

## 2016-07-01 NOTE — ED Provider Notes (Signed)
[facility] DEPT Provider Note   CSN: [id number] Arrival date & time: 07/01/16  2229     History   Chief Complaint Chief Complaint  Patient presents with  . Sore Throat    HPI Christina Randall is a 31 y.o. female.  HPI  Christina Randall is a 31 y.o. female who presents to the Emergency Department complaining of sore throat for two days.  She reports noticing a "scratchy" throat that has progressed to pain associated with swallowing and loss of her voice.  She also complains of nasal congestion and occasional cough, frontal headache, fever and chills.  She has tried OTC cold medication without relief.  She denies shortness of breath, chest pain, or difficulty swallowing.  Pt reports possible strep exposure   Past Medical History:  Diagnosis Date  . Anemia   . Asthma   . Back pain   . Frequent headaches   . Herniated disc   . Herpes   . Hypertension   . No pertinent past medical history   . Pregnant 02/09/2014  . Scoliosis   . Spotting during pregnancy in first trimester 02/09/2014    Patient Active Problem List   Diagnosis Date Noted  . 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 Uterine fibroid during pregnancy, antepartum 03/31/2014  . Opiate dependence ([place]) 03/31/2014  . H N P-LUMBAR 01/05/2009  . DEGENERATIVE JOINT DISEASE, KNEE 11/26/2008  . DERANGEMENT MENISCUS 11/26/2008  . 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 BACK PAIN 11/26/2008    Past Surgical History:  Procedure Laterality Date  . ANAL FISSURE REPAIR    . LAPAROSCOPIC BILATERAL SALPINGECTOMY Bilateral 12/30/2014   Procedure: LAPAROSCOPIC BILATERAL SALPINGECTOMY;  Surgeon: Florian Buff, MD;  Location: [facility];  Service: Gynecology;  Laterality: Bilateral;  . NO PAST SURGERIES      OB History    Gravida Para Term Preterm AB Living   4 3 3  0 1 3   SAB TAB Ectopic Multiple Live Births   0 1 0 0 3       Home Medications    Prior to Admission medications   Medication Sig Start Date End Date Taking?  Authorizing Provider  HYDROcodone-acetaminophen (NORCO) 10-325 MG per tablet TK 1 T PO UP TO QID PRF LOW BACK PAIN 11/04/14   Historical Provider, MD  ibuprofen (ADVIL,MOTRIN) 800 MG tablet TK 1 T PO TID FOR INFLAMMATION AND OR PAIN 01/18/15   Historical Provider, MD  ketorolac (TORADOL) 10 MG tablet Take 1 tablet (10 mg total) by mouth every 8 (eight) hours as needed. Patient not taking: Reported on 01/21/2015 12/30/14   Florian Buff, MD  methocarbamol (ROBAXIN) 500 MG tablet Take 1 tablet (500 mg total) by mouth 3 (three) times daily. 04/30/16   Jenine Krisher, PA-C  predniSONE (DELTASONE) 10 MG tablet Take 6 tablets day one, 5 tablets day two, 4 tablets day three, 3 tablets day four, 2 tablets day five, then 1 tablet day six 04/30/16   Kem Parkinson, PA-C    Family History Family History  Problem Relation Age of Onset  . Hypertension Father   . Diabetes Maternal Grandmother   . Cancer Maternal Grandmother     pancreatic  . Cancer Maternal Grandfather     lungs    Social History Social History  Substance Use Topics  . Smoking status: Never Smoker  . Smokeless tobacco: Never Used  . Alcohol use No     Allergies   Patient has no known allergies.   Review of  Systems Review of Systems  Constitutional: Positive for chills and fever. Negative for activity change and appetite change.  HENT: Positive for congestion, rhinorrhea, sore throat and voice change. Negative for ear pain, facial swelling and trouble swallowing.   Eyes: Negative for pain and visual disturbance.  Respiratory: Positive for cough. Negative for shortness of breath.   Gastrointestinal: Negative for abdominal pain, nausea and vomiting.  Musculoskeletal: Negative for arthralgias, neck pain and neck stiffness.  Skin: Negative for color change and rash.  Neurological: Negative for dizziness, facial asymmetry, speech difficulty, numbness and headaches.  Hematological: Negative for adenopathy.  All other systems  reviewed and are negative.    Physical Exam Updated Vital Signs BP 137/70 (BP Location: Left Arm)   Pulse 94   Temp 98.1 F (36.7 C) (Oral)   Resp 17   Ht 5\' 8"  (1.727 m)   Wt 127 kg   LMP 06/04/2016   SpO2 96%   BMI 42.57 kg/m   Physical Exam  Constitutional: She is oriented to person, place, and time. She appears well-developed and well-nourished. No distress.  HENT:  Head: Normocephalic and atraumatic.  Right Ear: Tympanic membrane and ear canal normal.  Left Ear: Tympanic membrane and ear canal normal.  Mouth/Throat: Uvula is midline and mucous membranes are normal. No oral lesions. No trismus in the jaw. No uvula swelling. Posterior oropharyngeal edema and posterior oropharyngeal erythema present. No oropharyngeal exudate or tonsillar abscesses.  Neck: Normal range of motion. Neck supple.  Cardiovascular: Normal rate, regular rhythm and normal heart sounds.   Pulmonary/Chest: Effort normal and breath sounds normal.  Abdominal: Soft. She exhibits no distension. There is no splenomegaly. There is no tenderness.  Musculoskeletal: Normal range of motion.  Lymphadenopathy:    She has no cervical adenopathy.  Neurological: She is alert and oriented to person, place, and time. She exhibits normal muscle tone. Coordination normal.  Skin: Skin is warm and dry.  Nursing note and vitals reviewed.    ED Treatments / Results  Labs (all labs ordered are listed, but only abnormal results are displayed) Labs Reviewed  RAPID STREP SCREEN (NOT AT [facility])    EKG  EKG Interpretation None       Radiology No results found.  Procedures Procedures (including critical care time)  Medications Ordered in ED Medications - No data to display   Initial Impression / Assessment and Plan / ED Course  I have reviewed the triage vital signs and the nursing notes.  Pertinent labs & imaging results that were available during my care of the patient were reviewed by me and considered in  my medical decision making (see chart for details).     Pt is well appearing, non-toxic.  Airway is patent.  No PTA.  Handles own secretions without difficulty.  Edema, erythema of the bilateral tonsils, no exudate.  Possible strep exposure.  Will treat with amoxil and magic mouthwash.    Final Clinical Impressions(s) / ED Diagnoses   Final diagnoses:  Sore throat    New Prescriptions New Prescriptions   No medications on file     Bufford Lope 07/02/16 [place], MD 07/02/16 [phone]

## 2016-07-01 NOTE — ED Triage Notes (Addendum)
Pt reports cough, congestion, headache, chills, and sore throat since Thursday. Pt reports tightness in her throat.

## 2016-07-02 MED ORDER — AMOXICILLIN 250 MG PO CAPS
500.0000 mg | ORAL_CAPSULE | Freq: Once | ORAL | Status: AC
  Administered 2016-07-02: 500 mg via ORAL
  Filled 2016-07-02: qty 2

## 2016-07-02 MED ORDER — MAGIC MOUTHWASH W/LIDOCAINE: 5.0000 mL | Freq: Three times a day (TID) | ORAL | 0 refills | Status: DC | PRN

## 2016-07-02 MED ORDER — AMOXICILLIN 500 MG PO CAPS: 500.0000 mg | ORAL_CAPSULE | Freq: Three times a day (TID) | ORAL | 0 refills | Status: DC

## 2016-07-02 NOTE — ED Notes (Signed)
Pt ambulatory to waiting room. Pt verbalized understanding of discharge instructions.   

## 2016-07-02 NOTE — Discharge Instructions (Signed)
Ibuprofen every 6 hrs.  Drink plenty of fluids.  You can take OTC Delsym as directed if needed for cough.  Follow-up with your primary provider if needed.

## 2016-07-04 LAB — CULTURE, GROUP A STREP (THRC)

## 2017-01-16 ENCOUNTER — Emergency Department (HOSPITAL_COMMUNITY)
Admission: EM | Admit: 2017-01-16 | Discharge: 2017-01-16 | Disposition: A | Discharge status: Home / Self Care | Payer: BLUE CROSS/BLUE SHIELD | Attending: Emergency Medicine | Admitting: Emergency Medicine

## 2017-01-16 ENCOUNTER — Other Ambulatory Visit: Payer: Self-pay

## 2017-01-16 ENCOUNTER — Encounter (HOSPITAL_COMMUNITY): Payer: Self-pay | Admitting: Emergency Medicine

## 2017-01-16 DIAGNOSIS — J45909 Unspecified asthma, uncomplicated: Secondary | ICD-10-CM | POA: Diagnosis not present

## 2017-01-16 DIAGNOSIS — Z79899 Other long term (current) drug therapy: Secondary | ICD-10-CM | POA: Insufficient documentation

## 2017-01-16 DIAGNOSIS — J069 Acute upper respiratory infection, unspecified: Secondary | ICD-10-CM | POA: Diagnosis not present

## 2017-01-16 DIAGNOSIS — I10 Essential (primary) hypertension: Secondary | ICD-10-CM | POA: Diagnosis not present

## 2017-01-16 DIAGNOSIS — R05 Cough: Secondary | ICD-10-CM | POA: Diagnosis present

## 2017-01-16 DIAGNOSIS — B9789 Other viral agents as the cause of diseases classified elsewhere: Secondary | ICD-10-CM | POA: Insufficient documentation

## 2017-01-16 LAB — RAPID STREP SCREEN (MED CTR MEBANE ONLY): Streptococcus, Group A Screen (Direct): NEGATIVE

## 2017-01-16 MED ORDER — ALBUTEROL SULFATE HFA 108 (90 BASE) MCG/ACT IN AERS: 1.0000 | INHALATION_SPRAY | Freq: Four times a day (QID) | RESPIRATORY_TRACT | 0 refills | Status: DC | PRN

## 2017-01-16 MED ORDER — IBUPROFEN 800 MG PO TABS: 800.0000 mg | ORAL_TABLET | Freq: Three times a day (TID) | ORAL | 0 refills | Status: DC

## 2017-01-16 MED ORDER — BENZONATATE 100 MG PO CAPS: 200.0000 mg | ORAL_CAPSULE | Freq: Three times a day (TID) | ORAL | 0 refills | Status: DC | PRN

## 2017-01-16 NOTE — ED Triage Notes (Signed)
Pt fc/o sore throat and cough since Friday.

## 2017-01-16 NOTE — ED Provider Notes (Signed)
[facility] EMERGENCY DEPARTMENT Provider Note   CSN: [id number] Arrival date & time: 01/16/17  0815     History   Chief Complaint Chief Complaint  Patient presents with  . Cough    HPI Christina Randall is a 31 y.o. female.  HPI   Christina Randall is a 31 y.o. female who presents to the Emergency Department complaining of sore throat and cough for 4 days.  Cough is intermittently productive.  She also reports some nasal congestion.  Sore throat is worse in the morning.  She states that her children are also sick with similar symptoms.  No fever or shortness of breath.  She is tried over-the-counter cough and cold medications without relief.  She also denies nausea or vomiting, abdominal pain, chest pain.  History of asthma was during her childhood, states that she does not currently take any medications for it.     Past Medical History:  Diagnosis Date  . Anemia   . Asthma   . Back pain   . Frequent headaches   . Herniated disc   . Herpes   . Hypertension   . No pertinent past medical history   . Pregnant 02/09/2014  . Scoliosis   . Spotting during pregnancy in first trimester 02/09/2014    Patient Active Problem List   Diagnosis Date Noted  . 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 Uterine fibroid during pregnancy, antepartum 03/31/2014  . Opiate dependence ([place]) 03/31/2014  . H N P-LUMBAR 01/05/2009  . DEGENERATIVE JOINT DISEASE, KNEE 11/26/2008  . DERANGEMENT MENISCUS 11/26/2008  . 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 BACK PAIN 11/26/2008    Past Surgical History:  Procedure Laterality Date  . ANAL FISSURE REPAIR    . NO PAST SURGERIES      OB History    Gravida Para Term Preterm AB Living   4 3 3  0 1 3   SAB TAB Ectopic Multiple Live Births   0 1 0 0 3       Home Medications    Prior to Admission medications   Medication Sig Start Date End Date Taking? Authorizing Provider  amoxicillin (AMOXIL) 500 MG capsule Take 1 capsule (500 mg total) by mouth  3 (three) times daily. 07/02/16   Isobella Ascher, PA-C  HYDROcodone-acetaminophen (NORCO) 10-325 MG per tablet TK 1 T PO UP TO QID PRF LOW BACK PAIN 11/04/14   [provider]  ibuprofen (ADVIL,MOTRIN) 800 MG tablet TK 1 T PO TID FOR INFLAMMATION AND OR PAIN 01/18/15   [provider]  ketorolac (TORADOL) 10 MG tablet Take 1 tablet (10 mg total) by mouth every 8 (eight) hours as needed. Patient not taking: Reported on 01/21/2015 12/30/14   Florian Buff, MD  magic mouthwash w/lidocaine SOLN Take 5 mLs by mouth 3 (three) times daily as needed for mouth pain. Swish and spit, do not swallow 07/02/16   Renton Berkley, PA-C  methocarbamol (ROBAXIN) 500 MG tablet Take 1 tablet (500 mg total) by mouth 3 (three) times daily. 04/30/16   Yosiel Thieme, PA-C  predniSONE (DELTASONE) 10 MG tablet Take 6 tablets day one, 5 tablets day two, 4 tablets day three, 3 tablets day four, 2 tablets day five, then 1 tablet day six 04/30/16   Kem Parkinson, PA-C    Family History Family History  Problem Relation Age of Onset  . Hypertension Father   . Diabetes Maternal Grandmother   . Cancer Maternal Grandmother        pancreatic  .  Cancer Maternal Grandfather        lungs    Social History Social History   Tobacco Use  . Smoking status: Never Smoker  . Smokeless tobacco: Never Used  Substance Use Topics  . Alcohol use: No  . Drug use: No     Allergies   Patient has no known allergies.   Review of Systems Review of Systems  Constitutional: Negative for activity change, appetite change, chills and fever.  HENT: Positive for congestion, sore throat and voice change. Negative for ear pain, facial swelling and trouble swallowing.   Respiratory: Positive for cough. Negative for chest tightness, shortness of breath and wheezing.   Cardiovascular: Negative for chest pain.  Gastrointestinal: Negative for abdominal pain, nausea and vomiting.  Genitourinary: Negative for dysuria.    Musculoskeletal: Negative for arthralgias.  Skin: Negative for rash.  Neurological: Negative for dizziness, weakness and numbness.  Hematological: Negative for adenopathy.  All other systems reviewed and are negative.    Physical Exam Updated Vital Signs BP (!) 146/83   Pulse 97   Temp 98.7 F (37.1 C) (Oral)   Resp 18   Ht 5\' 8"  (1.727 m)   Wt 127 kg (280 lb)   LMP 01/08/2017   SpO2 100%   BMI 42.57 kg/m   Physical Exam  Constitutional: She is oriented to person, place, and time. She appears well-developed and well-nourished. No distress.  HENT:  Head: Normocephalic and atraumatic.  Right Ear: Tympanic membrane and ear canal normal.  Left Ear: Tympanic membrane and ear canal normal.  Mouth/Throat: Uvula is midline and mucous membranes are normal. No trismus in the jaw. No uvula swelling. Posterior oropharyngeal edema and posterior oropharyngeal erythema present. No oropharyngeal exudate.  Uvula is midline and nonedematous.  No peritonsillar or retropharyngeal abscess.  No exudate.  Eyes: EOM are normal. Pupils are equal, round, and reactive to light.  Neck: Normal range of motion, full passive range of motion without pain and phonation normal. Neck supple.  Cardiovascular: Normal rate, regular rhythm and intact distal pulses.  No murmur heard. Pulmonary/Chest: Effort normal and breath sounds normal. No stridor. No respiratory distress. She has no wheezes. She has no rales. She exhibits no tenderness.  Abdominal: Soft. She exhibits no distension. There is no tenderness.  Musculoskeletal: Normal range of motion. She exhibits no edema.  Lymphadenopathy:    She has no cervical adenopathy.  Neurological: She is alert and oriented to person, place, and time. No sensory deficit. She exhibits normal muscle tone. Coordination normal.  Skin: Skin is warm and dry.  Psychiatric: She has a normal mood and affect.  Nursing note and vitals reviewed.    ED Treatments / Results   Labs (all labs ordered are listed, but only abnormal results are displayed) Labs Reviewed  RAPID STREP SCREEN (NOT AT [facility])    EKG  EKG Interpretation None       Radiology No results found.  Procedures Procedures (including critical care time)  Medications Ordered in ED Medications - No data to display   Initial Impression / Assessment and Plan / ED Course  I have reviewed the triage vital signs and the nursing notes.  Pertinent labs & imaging results that were available during my care of the patient were reviewed by me and considered in my medical decision making (see chart for details).     Patient well-appearing.  Vital signs are stable.  Strep screen is negative.  Symptoms are likely viral.  Final Clinical Impressions(s) /  ED Diagnoses   Final diagnoses:  Viral URI with cough    ED Discharge Orders    None       Kem Parkinson, PA-C 01/16/17 0950    Nat Christen, MD 01/17/17 [phone]

## 2017-01-16 NOTE — Discharge Instructions (Signed)
Follow-up with your doctor or return here for any worsening symptoms

## 2017-01-18 LAB — CULTURE, GROUP A STREP (THRC)

## 2017-04-18 ENCOUNTER — Ambulatory Visit: Payer: Self-pay | Admitting: Adult Health

## 2018-09-05 ENCOUNTER — Other Ambulatory Visit: Payer: BLUE CROSS/BLUE SHIELD

## 2018-09-05 ENCOUNTER — Other Ambulatory Visit: Payer: Self-pay

## 2018-09-05 DIAGNOSIS — Z20822 Contact with and (suspected) exposure to covid-19: Secondary | ICD-10-CM

## 2018-09-11 LAB — NOVEL CORONAVIRUS, NAA: SARS-CoV-2, NAA: NOT DETECTED

## 2018-09-26 ENCOUNTER — Other Ambulatory Visit: Payer: Self-pay

## 2018-09-26 ENCOUNTER — Other Ambulatory Visit: Payer: Medicaid Other

## 2018-09-26 DIAGNOSIS — Z20822 Contact with and (suspected) exposure to covid-19: Secondary | ICD-10-CM

## 2018-09-29 LAB — NOVEL CORONAVIRUS, NAA: SARS-CoV-2, NAA: NOT DETECTED

## 2018-10-03 ENCOUNTER — Other Ambulatory Visit: Payer: Medicaid Other

## 2018-10-03 ENCOUNTER — Other Ambulatory Visit: Payer: Self-pay

## 2018-10-03 DIAGNOSIS — Z20822 Contact with and (suspected) exposure to covid-19: Secondary | ICD-10-CM

## 2018-10-05 LAB — NOVEL CORONAVIRUS, NAA: SARS-CoV-2, NAA: NOT DETECTED

## 2019-01-22 ENCOUNTER — Emergency Department (HOSPITAL_COMMUNITY)
Admission: EM | Admit: 2019-01-22 | Discharge: 2019-01-22 | Disposition: A | Discharge status: Home / Self Care | Payer: 59 | Attending: Emergency Medicine | Admitting: Emergency Medicine

## 2019-01-22 ENCOUNTER — Emergency Department (HOSPITAL_COMMUNITY): Payer: 59

## 2019-01-22 ENCOUNTER — Other Ambulatory Visit: Payer: Self-pay

## 2019-01-22 ENCOUNTER — Encounter (HOSPITAL_COMMUNITY): Payer: Self-pay | Admitting: Emergency Medicine

## 2019-01-22 DIAGNOSIS — Z3202 Encounter for pregnancy test, result negative: Secondary | ICD-10-CM | POA: Diagnosis not present

## 2019-01-22 DIAGNOSIS — R1031 Right lower quadrant pain: Secondary | ICD-10-CM | POA: Diagnosis not present

## 2019-01-22 DIAGNOSIS — I10 Essential (primary) hypertension: Secondary | ICD-10-CM | POA: Insufficient documentation

## 2019-01-22 DIAGNOSIS — J45909 Unspecified asthma, uncomplicated: Secondary | ICD-10-CM | POA: Diagnosis not present

## 2019-01-22 DIAGNOSIS — R1032 Left lower quadrant pain: Secondary | ICD-10-CM | POA: Diagnosis not present

## 2019-01-22 DIAGNOSIS — Z79899 Other long term (current) drug therapy: Secondary | ICD-10-CM | POA: Insufficient documentation

## 2019-01-22 DIAGNOSIS — K921 Melena: Secondary | ICD-10-CM | POA: Insufficient documentation

## 2019-01-22 DIAGNOSIS — R197 Diarrhea, unspecified: Secondary | ICD-10-CM | POA: Insufficient documentation

## 2019-01-22 LAB — COMPREHENSIVE METABOLIC PANEL
ALT: 15 U/L (ref 0–44)
AST: 17 U/L (ref 15–41)
Albumin: 3.9 g/dL (ref 3.5–5.0)
Alkaline Phosphatase: 60 U/L (ref 38–126)
Anion gap: 12 (ref 5–15)
BUN: 14 mg/dL (ref 6–20)
CO2: 26 mmol/L (ref 22–32)
Calcium: 9.1 mg/dL (ref 8.9–10.3)
Chloride: 99 mmol/L (ref 98–111)
Creatinine, Ser: 0.84 mg/dL (ref 0.44–1.00)
GFR calc Af Amer: >60 mL/min (ref >60)
GFR calc non Af Amer: >60 mL/min (ref >60)
Glucose, Bld: 131 mg/dL — ABNORMAL HIGH (ref 70–99)
Potassium: 3.2 mmol/L — ABNORMAL LOW (ref 3.5–5.1)
Sodium: 137 mmol/L (ref 135–145)
Total Bilirubin: 0.5 mg/dL (ref 0.3–1.2)
Total Protein: 7.8 g/dL (ref 6.5–8.1)

## 2019-01-22 LAB — URINALYSIS, ROUTINE W REFLEX MICROSCOPIC
Bilirubin Urine: NEGATIVE
Glucose, UA: NEGATIVE mg/dL
Ketones, ur: NEGATIVE mg/dL
Nitrite: NEGATIVE
Protein, ur: 30 mg/dL — AB
Specific Gravity, Urine: 1.024 (ref 1.005–1.030)
pH: 6 (ref 5.0–8.0)

## 2019-01-22 LAB — POC URINE PREG, ED: Preg Test, Ur: NEGATIVE

## 2019-01-22 LAB — CBC
HCT: 39.8 % (ref 36.0–46.0)
Hemoglobin: 12.3 g/dL (ref 12.0–15.0)
MCH: 25.4 pg — ABNORMAL LOW (ref 26.0–34.0)
MCHC: 30.9 g/dL (ref 30.0–36.0)
MCV: 82.1 fL (ref 80.0–100.0)
Platelets: 392 10*3/uL (ref 150–400)
RBC: 4.85 MIL/uL (ref 3.87–5.11)
RDW: 15 % (ref 11.5–15.5)
WBC: 8.8 10*3/uL (ref 4.0–10.5)
nRBC: 0 % (ref 0.0–0.2)

## 2019-01-22 LAB — POC OCCULT BLOOD, ED: Fecal Occult Bld: POSITIVE — AB

## 2019-01-22 LAB — LIPASE, BLOOD: Lipase: 21 U/L (ref 11–51)

## 2019-01-22 MED ORDER — POTASSIUM CHLORIDE CRYS ER 20 MEQ PO TBCR
40.0000 meq | EXTENDED_RELEASE_TABLET | Freq: Once | ORAL | Status: AC
  Administered 2019-01-22: 15:00:00 40 meq via ORAL
  Filled 2019-01-22: qty 2

## 2019-01-22 MED ORDER — ONDANSETRON HCL 4 MG PO TABS: 4.0000 mg | ORAL_TABLET | Freq: Four times a day (QID) | ORAL | 0 refills | Status: DC

## 2019-01-22 MED ORDER — DICYCLOMINE HCL 20 MG PO TABS: 20.0000 mg | ORAL_TABLET | Freq: Two times a day (BID) | ORAL | 0 refills | Status: DC

## 2019-01-22 MED ORDER — DICYCLOMINE HCL 10 MG PO CAPS
10.0000 mg | ORAL_CAPSULE | Freq: Once | ORAL | Status: AC
  Administered 2019-01-22: 10 mg via ORAL
  Filled 2019-01-22: qty 1

## 2019-01-22 MED ORDER — SODIUM CHLORIDE 0.9% FLUSH: 3.0000 mL | Freq: Once | INTRAVENOUS | Status: DC

## 2019-01-22 MED ORDER — METRONIDAZOLE 500 MG PO TABS: 500.0000 mg | ORAL_TABLET | Freq: Three times a day (TID) | ORAL | 0 refills | Status: DC

## 2019-01-22 MED ORDER — ONDANSETRON 4 MG PO TBDP
4.0000 mg | ORAL_TABLET | Freq: Once | ORAL | Status: AC
  Administered 2019-01-22: 4 mg via ORAL
  Filled 2019-01-22: qty 1

## 2019-01-22 MED ORDER — CIPROFLOXACIN HCL 500 MG PO TABS: 500.0000 mg | ORAL_TABLET | Freq: Two times a day (BID) | ORAL | 0 refills | Status: DC

## 2019-01-22 MED ORDER — IOHEXOL 300 MG/ML  SOLN
125.0000 mL | Freq: Once | INTRAMUSCULAR | Status: AC | PRN
  Administered 2019-01-22: 125 mL via INTRAVENOUS

## 2019-01-22 NOTE — ED Notes (Signed)
Attempted IV x2 without success  

## 2019-01-22 NOTE — Discharge Instructions (Addendum)
Take Cipro and Flagyl as prescribed until completed if your symptoms are not improving over the next 24 hours.  Do not drink alcohol while taking Flagyl.  Take Bentyl as prescribed, as needed for abdominal cramping.  Take Zofran every 6 hours as needed for nausea or vomiting.  Make sure to drink plenty of fluids and as you are tolerating clear fluids, you can slowly increase her diet as tolerated to bland foods such as bananas, rice, applesauce, toast.  Please return the emergency department if you develop any increasing pain, increasing bloody stools, fever, intractable vomiting, or any other concerning symptoms peer

## 2019-01-22 NOTE — ED Triage Notes (Signed)
Patient reports bright red blood in her stool this am.

## 2019-01-22 NOTE — ED Triage Notes (Signed)
Patient states she woke up this am with abdominal pain. Has had chills, feeling weakness, and continued abdominal pain and cramping.

## 2019-01-22 NOTE — ED Provider Notes (Addendum)
[facility] EMERGENCY DEPARTMENT Provider Note   CSN: [id number] Arrival date & time: 01/22/19  1224     History   Chief Complaint Chief Complaint  Patient presents with   Abdominal Pain    HPI Christina Randall is a 33 y.o. female with history of asthma who presents with nausea and bright red bloody diarrhea that began at 3 or 4 AM.  Patient reports eating red velvet cake yesterday evening but did not taste right and she did not finish it.  She has had some lower abdominal cramping.  She denies any fever, chest pain, shortness of breath, urinary symptoms, abnormal vaginal bleeding or discharge.     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 Authorizing Provider  albuterol (PROVENTIL  HFA;VENTOLIN HFA) 108 (90 Base) MCG/ACT inhaler Inhale 1-2 puffs every 6 (six) hours as needed into the lungs for wheezing or shortness of breath. 01/16/17   Triplett, Tammy, PA-C  benzonatate (TESSALON) 100 MG capsule Take 2 capsules (200 mg total) 3 (three) times daily as needed by mouth for cough. Swallow whole, do not chew 01/16/17   Triplett, Tammy, PA-C  ciprofloxacin (CIPRO) 500 MG tablet Take 1 tablet (500 mg total) by mouth 2 (two) times daily. 01/22/19   Kathia Covington, Bea Graff, PA-C  dicyclomine (BENTYL) 20 MG tablet Take 1 tablet (20 mg total) by mouth 2 (two) times daily. 01/22/19   Eun Vermeer, Bea Graff, PA-C  HYDROcodone-acetaminophen (NORCO) 10-325 MG per tablet TK 1 T PO UP TO QID PRF LOW BACK PAIN 11/04/14   [provider]  ibuprofen (ADVIL,MOTRIN) 800 MG tablet Take 1 tablet (800 mg total) 3 (three) times daily by mouth. 01/16/17   Triplett, Tammy, PA-C  metroNIDAZOLE (FLAGYL) 500 MG tablet Take 1 tablet (500 mg total) by mouth 3 (three) times daily. 01/22/19   Sherica Paternostro, Bea Graff, PA-C  ondansetron (ZOFRAN) 4 MG tablet Take 1 tablet (4 mg total) by mouth every 6 (six) hours. 01/22/19  Kamryn Messineo, [place] M, PA-C  Phenylephrine-DM (THERAFLU COLD/COUGH DAYTIME PO) Take 10 mLs every 6 (six) hours as needed by mouth.    [provider]  Phenylephrine-Pheniramine-DM [facility] COLD & COUGH) 12-24-18 MG PACK Take 1 packet 2 (two) times daily as needed by mouth.    [provider]    Family History Family History  Problem Relation Age of Onset   Hypertension Father    Diabetes Maternal Grandmother    Cancer Maternal Grandmother        pancreatic   Cancer Maternal Grandfather        lungs    Social History Social History   Tobacco Use   Smoking status: Never Smoker   Smokeless tobacco: Never Used  Substance Use Topics   Alcohol use: No   Drug use: No     Allergies   Patient has no known allergies.   Review of Systems Review of Systems    Constitutional: Negative for chills and fever.  HENT: Positive for congestion. Negative for facial swelling and sore throat.   Respiratory: Negative for shortness of breath.   Cardiovascular: Negative for chest pain.  Gastrointestinal: Positive for abdominal pain, blood in stool, diarrhea and nausea. Negative for vomiting.  Genitourinary: Negative for dysuria.  Musculoskeletal: Negative for back pain.  Skin: Negative for rash and wound.  Neurological: Negative for headaches.  Psychiatric/Behavioral: The patient is not nervous/anxious.      Physical Exam Updated Vital Signs BP (!) 140/92 (BP Location: Right Arm)    Pulse 96    Temp 98.6 F (37 C) (Oral)    Resp 16    Ht 5\' 8"  (1.727 m)    Wt 118.8 kg    LMP 01/08/2019    SpO2 100%    BMI 39.84 kg/m   Physical Exam Vitals signs and nursing note reviewed.  Constitutional:      General: She is not in acute distress.    Appearance: She is well-developed. She is not diaphoretic.  HENT:     Head: Normocephalic and atraumatic.     Mouth/Throat:     Pharynx: No oropharyngeal exudate.  Eyes:     General: No scleral icterus.       Right eye: No discharge.        Left eye: No discharge.     Conjunctiva/sclera: Conjunctivae normal.     Pupils: Pupils are equal, round, and reactive to light.  Neck:     Musculoskeletal: Normal range of motion and neck supple.     Thyroid: No thyromegaly.  Cardiovascular:     Rate and Rhythm: Normal rate and regular rhythm.     Heart sounds: Normal heart sounds. No murmur. No friction rub. No gallop.   Pulmonary:     Effort: Pulmonary effort is normal. No respiratory distress.     Breath sounds: Normal breath sounds. No stridor. No wheezing or rales.  Abdominal:     General: Bowel sounds are normal. There is no distension.     Palpations: Abdomen is soft.     Tenderness: There is abdominal tenderness in the right lower quadrant and left lower quadrant. There is no right CVA tenderness, left CVA  tenderness, guarding or rebound. Positive signs include McBurney's sign.  Genitourinary:    Rectum: Guaiac result positive. No tenderness, external hemorrhoid or internal hemorrhoid.     Comments: Scant bright red blood per rectum Lymphadenopathy:     Cervical: No cervical adenopathy.  Skin:    General: Skin is  warm and dry.     Coloration: Skin is not pale.     Findings: No rash.  Neurological:     Mental Status: She is alert.     Coordination: Coordination normal.      ED Treatments / Results  Labs (all labs ordered are listed, but only abnormal results are displayed) Labs Reviewed  COMPREHENSIVE METABOLIC PANEL - Abnormal; Notable for the following components:      Result Value   Potassium 3.2 (*)    Glucose, Bld 131 (*)    All other components within normal limits  CBC - Abnormal; Notable for the following components:   MCH 25.4 (*)    All other components within normal limits  URINALYSIS, ROUTINE W REFLEX MICROSCOPIC - Abnormal; Notable for the following components:   APPearance HAZY (*)    Hgb urine dipstick SMALL (*)    Protein, ur 30 (*)    Leukocytes,Ua SMALL (*)    Bacteria, UA RARE (*)    All other components within normal limits  POC OCCULT BLOOD, ED - Abnormal; Notable for the following components:   Fecal Occult Bld POSITIVE (*)    All other components within normal limits  URINE CULTURE  LIPASE, BLOOD  POC URINE PREG, ED    EKG None  Radiology Ct Abdomen Pelvis W Contrast  Result Date: 01/22/2019 CLINICAL DATA:  Abdominal pain, chills, and weakness upon awakening this morning, continued abdominal pain and cramping EXAM: CT ABDOMEN AND PELVIS WITH CONTRAST TECHNIQUE: Multidetector CT imaging of the abdomen and pelvis was performed using the standard protocol following bolus administration of intravenous contrast. Sagittal and coronal MPR images reconstructed from axial data set. CONTRAST:  133mL OMNIPAQUE IOHEXOL 300 MG/ML SOLN IV. No oral contrast.  COMPARISON:  None FINDINGS: Lower chest: Lung bases clear Hepatobiliary: Gallbladder and liver normal appearance Pancreas: Normal appearance Spleen: Normal appearance Adrenals/Urinary Tract: Adrenal glands, kidneys, ureters, and bladder normal appearance Stomach/Bowel: Normal appendix. Questionable diffuse wall thickening of the descending colon versus artifact from underdistention, cannot exclude colitis. Stomach and remaining bowel loops unremarkable. Vascular/Lymphatic: Vascular structures patent.  No adenopathy. Reproductive: Unremarkable uterus and ovaries. Prominent cervix with low-attenuation foci likely nabothian cysts. Other: No free air or free fluid.  No hernia. Musculoskeletal: Degenerative disc disease changes L4-L5. No other osseous abnormalities. IMPRESSION: Questionable diffuse wall thickening of the descending colon versus artifact related to underdistention, cannot exclude colitis. Remainder of exam unremarkable. Electronically Signed   By: Lavonia Dana M.D.   On: 01/22/2019 17:34    Procedures Procedures (including critical care time)  Medications Ordered in ED Medications  sodium chloride flush (NS) 0.9 % injection 3 mL (has no administration in time range)  ondansetron (ZOFRAN-ODT) disintegrating tablet 4 mg (4 mg Oral Given 01/22/19 1515)  dicyclomine (BENTYL) capsule 10 mg (10 mg Oral Given 01/22/19 1515)  potassium chloride SA (KLOR-CON) CR tablet 40 mEq (40 mEq Oral Given 01/22/19 1515)  iohexol (OMNIPAQUE) 300 MG/ML solution 125 mL (125 mLs Intravenous Contrast Given 01/22/19 1717)     Initial Impression / Assessment and Plan / ED Course  I have reviewed the triage vital signs and the nursing notes.  Pertinent labs & imaging results that were available during my care of the patient were reviewed by me and considered in my medical decision making (see chart for details).        Patient presenting with abdominal cramping and bloody diarrhea.  She did have a piece of  cake last night that did  not taste right.  Labs are unremarkable except for mild hypokalemia which was replaced in the ED.  Patient feeling improved with Zofran and Bentyl in the ED.  Fecal occult positive.  Urine pregnancy negative.  CT abdomen pelvis is questionable for colitis.  I discussed the lesser likelihood that patient's symptoms were related to bacterial illness and using shared decision making, will discharge home with Cipro and Flagyl to begin taking if symptoms or not improving over the next 24 to 48 hours.  Suspect more likely related to food or viral.  UA shows small hematuria, small leukocytes, 11-20 WBCs, rare bacteria, 11-20 squamous epithelial cells.  Suspect contaminated sample, but urine culture sent.  Patient denies urinary symptoms.  Although, would treat if positive due to patient's abdominal pain.  If patient does begin Cipro and Flagyl for colitis, Cipro should treat this, however.  Return precautions discussed.  Patient understands and agrees with plan.  Patient vitals stable throughout ED course and discharged in satisfactory condition. I discussed patient case with Dr. Wyvonnia Dusky who guided the patient's management and agrees with plan.   Final Clinical Impressions(s) / ED Diagnoses   Final diagnoses:  Bloody diarrhea    ED Discharge Orders         Ordered    dicyclomine (BENTYL) 20 MG tablet  2 times daily     01/22/19 1801    ondansetron (ZOFRAN) 4 MG tablet  Every 6 hours     01/22/19 1801    ciprofloxacin (CIPRO) 500 MG tablet  2 times daily     01/22/19 1801    metroNIDAZOLE (FLAGYL) 500 MG tablet  3 times daily     01/22/19 1801             Frederica Kuster, PA-C 01/22/19 2128    Nat Christen, MD 01/23/19 1851

## 2019-01-22 NOTE — ED Notes (Signed)
Pt given ginger ale to drink. 

## 2019-01-24 LAB — URINE CULTURE: Culture: NO GROWTH

## 2019-03-03 ENCOUNTER — Other Ambulatory Visit: Payer: Self-pay

## 2019-03-03 ENCOUNTER — Ambulatory Visit: Payer: 59 | Attending: Internal Medicine

## 2019-03-03 DIAGNOSIS — Z20822 Contact with and (suspected) exposure to covid-19: Secondary | ICD-10-CM

## 2019-03-04 ENCOUNTER — Other Ambulatory Visit: Payer: 59

## 2019-03-05 LAB — NOVEL CORONAVIRUS, NAA: SARS-CoV-2, NAA: NOT DETECTED

## 2019-03-10 ENCOUNTER — Other Ambulatory Visit: Payer: Self-pay

## 2019-03-10 ENCOUNTER — Ambulatory Visit
Admission: EM | Admit: 2019-03-10 | Discharge: 2019-03-10 | Disposition: A | Discharge status: Home / Self Care | Payer: 59

## 2019-03-10 DIAGNOSIS — Z20822 Contact with and (suspected) exposure to covid-19: Secondary | ICD-10-CM | POA: Diagnosis not present

## 2019-03-10 MED ORDER — FLUTICASONE PROPIONATE 50 MCG/ACT NA SUSP: 2.0000 | Freq: Every day | NASAL | 0 refills | Status: DC

## 2019-03-10 MED ORDER — BENZONATATE 100 MG PO CAPS: 100.0000 mg | ORAL_CAPSULE | Freq: Three times a day (TID) | ORAL | 0 refills | Status: DC

## 2019-03-10 MED ORDER — CETIRIZINE HCL 10 MG PO TABS: 10.0000 mg | ORAL_TABLET | Freq: Every day | ORAL | 0 refills | Status: DC

## 2019-03-10 NOTE — ED Provider Notes (Signed)
[place]   [id number] 03/10/19 Arrival Time: [id number]   CC: COVID symptoms  SUBJECTIVE: History from: patient.  Christina Randall is a 34 y.o. female who presents with headache, loss of taste, loss of smell, and dry cough x 10 days.  Admits to positive COVID exposure through family, and boyfriend with similar symptoms.  Denies recent travel.  Has tried OTC medications without relief.  Denies previous COVID infection in the past. Complains of mild fatigue.  Denies fever, chills, sinus pain, sore throat, SOB, wheezing, chest pain, nausea, vomiting, changes in bowel or bladder habits.    ROS: As per HPI.  All other pertinent ROS negative.     Past Medical History:  Diagnosis Date  . Anemia   . Asthma   . Back pain   . Frequent headaches   . Herniated disc   . Herpes   . Hypertension   . No pertinent past medical history   . Pregnant 02/09/2014  . Scoliosis   . Spotting during pregnancy in first trimester 02/09/2014   Past Surgical History:  Procedure Laterality Date  . ANAL FISSURE REPAIR    . LAPAROSCOPIC BILATERAL SALPINGECTOMY Bilateral 12/30/2014   Procedure: LAPAROSCOPIC BILATERAL SALPINGECTOMY;  Surgeon: Florian Buff, MD;  Location: [facility];  Service: Gynecology;  Laterality: Bilateral;  . NO PAST SURGERIES     No Known Allergies No current facility-administered medications on file prior to encounter.   Current Outpatient Medications on File Prior to Encounter  Medication Sig Dispense Refill  . hydrochlorothiazide (MICROZIDE) 12.5 MG capsule Take 12.5 mg by mouth daily.    Marland Kitchen HYDROcodone-acetaminophen (NORCO) 10-325 MG per tablet TK 1 T PO UP TO QID PRF LOW BACK PAIN  0  . ibuprofen (ADVIL,MOTRIN) 800 MG tablet Take 1 tablet (800 mg total) 3 (three) times daily by mouth. 21 tablet 0  . [DISCONTINUED] albuterol (PROVENTIL HFA;VENTOLIN HFA) 108 (90 Base) MCG/ACT inhaler Inhale 1-2 puffs every 6 (six) hours as needed into the lungs for wheezing or shortness of  breath. 1 Inhaler 0  . [DISCONTINUED] dicyclomine (BENTYL) 20 MG tablet Take 1 tablet (20 mg total) by mouth 2 (two) times daily. 20 tablet 0   Social History   Socioeconomic History  . Marital status: Single    Spouse name: Not on file  . Number of children: Not on file  . Years of education: Not on file  . Highest education level: Not on file  Occupational History  . Not on file  Tobacco Use  . Smoking status: Never Smoker  . Smokeless tobacco: Never Used  Substance and Sexual Activity  . Alcohol use: No  . Drug use: No  . Sexual activity: Not Currently    Birth control/protection: None  Other Topics Concern  . Not on file  Social History Narrative  . Not on file   Social Determinants of Health   Financial Resource Strain:   . Difficulty of Paying Living Expenses: Not on file  Food Insecurity:   . Worried About Charity fundraiser in the Last Year: Not on file  . Ran Out of Food in the Last Year: Not on file  Transportation Needs:   . Lack of Transportation (Medical): Not on file  . Lack of Transportation (Non-Medical): Not on file  Physical Activity:   . Days of Exercise per Week: Not on file  . Minutes of Exercise per Session: Not on file  Stress:   . Feeling of Stress : Not on  file  Social Connections:   . Frequency of Communication with Friends and Family: Not on file  . Frequency of Social Gatherings with Friends and Family: Not on file  . Attends Religious Services: Not on file  . Active Member of Clubs or Organizations: Not on file  . Attends Archivist Meetings: Not on file  . Marital Status: Not on file  Intimate Partner Violence:   . Fear of Current or Ex-Partner: Not on file  . Emotionally Abused: Not on file  . Physically Abused: Not on file  . Sexually Abused: Not on file   Family History  Problem Relation Age of Onset  . Hypertension Father   . Diabetes Maternal Grandmother   . Cancer Maternal Grandmother        pancreatic  .  Cancer Maternal Grandfather        lungs    OBJECTIVE:  Vitals:   03/10/19 1600  BP: (!) 134/95  Pulse: 78  Resp: 16  Temp: 98.7 F (37.1 C)  TempSrc: Oral  SpO2: 96%     General appearance: alert; appears fatigued, but nontoxic; speaking in full sentences and tolerating own secretions HEENT: NCAT; Ears: EACs clear, TMs pearly gray; Eyes: PERRL.  EOM grossly intact. Nose: nares patent without rhinorrhea, turbinates swollen and erythematous, Throat: oropharynx clear, tonsils non erythematous or enlarged, uvula midline  Neck: supple without LAD Lungs: unlabored respirations, symmetrical air entry; cough: mild; no respiratory distress; CTAB Heart: regular rate and rhythm.   Skin: warm and dry Psychological: alert and cooperative; normal mood and affect  ASSESSMENT & PLAN:  1. Suspected COVID-19 virus infection     Meds ordered this encounter  Medications  . cetirizine (ZYRTEC) 10 MG tablet    Sig: Take 1 tablet (10 mg total) by mouth daily.    Dispense:  30 tablet    Refill:  0    Order Specific Question:   Supervising Provider    Answer:   Raylene Everts [id number]  . fluticasone (FLONASE) 50 MCG/ACT nasal spray    Sig: Place 2 sprays into both nostrils daily.    Dispense:  16 g    Refill:  0    Order Specific Question:   Supervising Provider    Answer:   Raylene Everts [id number]  . benzonatate (TESSALON) 100 MG capsule    Sig: Take 1 capsule (100 mg total) by mouth every 8 (eight) hours.    Dispense:  21 capsule    Refill:  0    Order Specific Question:   Supervising Provider    Answer:   Raylene Everts [id number]   COVID testing ordered.  It will take between 5-7 days for test results.  Someone will contact you regarding abnormal results.    In the meantime: You should remain isolated in your home for 10 days from symptom onset AND greater than 72 hours after symptoms resolution (absence of fever without the use of fever-reducing medication and  improvement in respiratory symptoms), whichever is longer Get plenty of rest and push fluids Tessalon Perles prescribed for cough Use OTC zyrtec for nasal congestion, runny nose, and/or sore throat Use OTC flonase for nasal congestion and runny nose Use medications daily for symptom relief Use OTC medications like ibuprofen or tylenol as needed fever or pain Call or go to the ED if you have any new or worsening symptoms such as fever, worsening cough, shortness of breath, chest tightness, chest pain, turning blue, changes in mental  status, etc...   Reviewed expectations re: course of current medical issues. Questions answered. Outlined signs and symptoms indicating need for more acute intervention. Patient verbalized understanding. After Visit Summary given.         Lestine Box, PA-C 03/10/19 1633

## 2019-03-10 NOTE — ED Triage Notes (Signed)
Pt presents to UC w/ c/o headache, loss of taste, loss of smell, cough x10 days.

## 2019-03-10 NOTE — Discharge Instructions (Signed)

## 2019-03-11 LAB — NOVEL CORONAVIRUS, NAA: SARS-CoV-2, NAA: DETECTED — AB

## 2019-03-12 ENCOUNTER — Encounter (HOSPITAL_COMMUNITY): Payer: Self-pay

## 2019-03-12 ENCOUNTER — Telehealth (HOSPITAL_COMMUNITY): Payer: Self-pay | Admitting: Emergency Medicine

## 2019-03-12 NOTE — Telephone Encounter (Signed)

## 2019-05-30 ENCOUNTER — Ambulatory Visit (INDEPENDENT_AMBULATORY_CARE_PROVIDER_SITE_OTHER): Payer: 59 | Admitting: Adult Health

## 2019-05-30 ENCOUNTER — Other Ambulatory Visit (HOSPITAL_COMMUNITY)
Admission: RE | Admit: 2019-05-30 | Discharge: 2019-05-30 | Disposition: A | Discharge status: Home / Self Care | Payer: 59 | Source: Ambulatory Visit | Attending: Adult Health | Admitting: Adult Health

## 2019-05-30 ENCOUNTER — Encounter: Payer: Self-pay | Admitting: Adult Health

## 2019-05-30 ENCOUNTER — Other Ambulatory Visit: Payer: Self-pay

## 2019-05-30 VITALS — BP 138/89 | HR 76 | Ht 68.0 in | Wt 268.0 lb

## 2019-05-30 DIAGNOSIS — Z8616 Personal history of COVID-19: Secondary | ICD-10-CM

## 2019-05-30 DIAGNOSIS — Z0000 Encounter for general adult medical examination without abnormal findings: Secondary | ICD-10-CM | POA: Insufficient documentation

## 2019-05-30 DIAGNOSIS — Z1151 Encounter for screening for human papillomavirus (HPV): Secondary | ICD-10-CM

## 2019-05-30 DIAGNOSIS — Z3009 Encounter for other general counseling and advice on contraception: Secondary | ICD-10-CM | POA: Diagnosis present

## 2019-05-30 DIAGNOSIS — Z01419 Encounter for gynecological examination (general) (routine) without abnormal findings: Secondary | ICD-10-CM

## 2019-05-30 DIAGNOSIS — Z9851 Tubal ligation status: Secondary | ICD-10-CM

## 2019-05-30 DIAGNOSIS — E041 Nontoxic single thyroid nodule: Secondary | ICD-10-CM

## 2019-05-30 NOTE — Progress Notes (Signed)
Patient ID: Christina Randall, female   DOB: [birth year], 34 y.o.   MRN: [id number] History of Present Illness: Christina Randall is a 34 year old black female,single, G4P3 in for a well woman gyn exam and pap. She has lost 37 lbs in about a year.She had COVID at Christmas.She had questions about tubal reversal, but had salpingectomy in 2016, so not an option.  PCP is Dr Karie Kirks.    Current Medications, Allergies, Past Medical History, Past Surgical History, Family History and Social History were reviewed in [employer] record.     Review of Systems: Patient denies any headaches, hearing loss, fatigue, blurred vision, shortness of breath, chest pain, abdominal pain, problems with bowel movements, urination, or intercourse. No joint pain or mood swings. +hair loss    Physical Exam:BP 138/89 (BP Location: Left Arm, Patient Position: Sitting, Cuff Size: Normal)   Pulse 76   Ht 5\' 8"  (1.727 m)   Wt 268 lb (121.6 kg)   LMP 05/22/2019   BMI 40.75 kg/m  General:  Well developed, well nourished, no acute distress Skin:  Warm and dry Neck:  Midline trachea, normal thyroid, good ROM, no lymphadenopathy Lungs; Clear to auscultation bilaterally Breast:  No dominant palpable mass, retraction, or nipple discharge,has bilateral nipple rods Cardiovascular: Regular rate and rhythm Abdomen:  Soft, non tender, no hepatosplenomegaly Pelvic:  External genitalia is normal in appearance, no lesions.  The vagina is normal in appearance. Urethra has no lesions or masses. The cervix is bulbous,pap with GC/CHL and high risk HPV 16/18 genotyping performed.  Uterus is felt to be normal size, shape, and contour.  No adnexal masses or tenderness noted.Bladder is non tender, no masses felt. Extremities/musculoskeletal:  No swelling or varicosities noted, no clubbing or cyanosis Psych:  No mood changes, alert and cooperative,seems happy Fall risk is low PHQ 2 score is 0. Alcohol audit is 0.  Examination  chaperoned by Levy Pupa LPN.   Impression and Plan: 1. Routine medical exam  2. Encounter for gynecological examination with Papanicolaou smear of cervix Pap sent Physical in 1 year Pap in 3 if normal  3. Right thyroid nodule Thyroid US 3/30 at 1;30 pm at [facility]  We will talk after results back

## 2019-06-03 ENCOUNTER — Ambulatory Visit (HOSPITAL_COMMUNITY)
Admission: RE | Admit: 2019-06-03 | Discharge: 2019-06-03 | Disposition: A | Discharge status: Home / Self Care | Payer: 59 | Source: Ambulatory Visit | Attending: Adult Health | Admitting: Adult Health

## 2019-06-03 ENCOUNTER — Other Ambulatory Visit: Payer: Self-pay

## 2019-06-03 DIAGNOSIS — E041 Nontoxic single thyroid nodule: Secondary | ICD-10-CM | POA: Diagnosis present

## 2019-06-03 LAB — CYTOLOGY - PAP
Chlamydia: NEGATIVE
Comment: NEGATIVE
Comment: NEGATIVE
Comment: NORMAL
Diagnosis: NEGATIVE
High risk HPV: NEGATIVE
Neisseria Gonorrhea: NEGATIVE

## 2020-01-26 ENCOUNTER — Emergency Department (HOSPITAL_COMMUNITY): Payer: 59

## 2020-01-26 ENCOUNTER — Encounter (HOSPITAL_COMMUNITY): Payer: Self-pay | Admitting: *Deleted

## 2020-01-26 ENCOUNTER — Emergency Department (HOSPITAL_COMMUNITY)
Admission: EM | Admit: 2020-01-26 | Discharge: 2020-01-26 | Disposition: A | Discharge status: Home / Self Care | Payer: 59 | Attending: Emergency Medicine | Admitting: Emergency Medicine

## 2020-01-26 ENCOUNTER — Other Ambulatory Visit: Payer: Self-pay

## 2020-01-26 DIAGNOSIS — Z79899 Other long term (current) drug therapy: Secondary | ICD-10-CM | POA: Insufficient documentation

## 2020-01-26 DIAGNOSIS — I10 Essential (primary) hypertension: Secondary | ICD-10-CM | POA: Insufficient documentation

## 2020-01-26 DIAGNOSIS — M25562 Pain in left knee: Secondary | ICD-10-CM

## 2020-01-26 DIAGNOSIS — J45909 Unspecified asthma, uncomplicated: Secondary | ICD-10-CM | POA: Diagnosis not present

## 2020-01-26 MED ORDER — DICLOFENAC SODIUM 75 MG PO TBEC: 75.0000 mg | DELAYED_RELEASE_TABLET | Freq: Two times a day (BID) | ORAL | 0 refills | Status: DC

## 2020-01-26 NOTE — ED Notes (Signed)
ED Provider at bedside. 

## 2020-01-26 NOTE — ED Triage Notes (Signed)
Pt c/o left knee pain x couple of weeks, worsening over the last few days. Pt reports "unbearable to walk on". Denies injury.

## 2020-01-26 NOTE — Discharge Instructions (Signed)
Schedule to see the Orthopaedist for evaluation  

## 2020-01-26 NOTE — ED Provider Notes (Signed)
[facility] EMERGENCY DEPARTMENT Provider Note   CSN: [id number] Arrival date & time: 01/26/20  1530     History Chief Complaint  Patient presents with  . Knee Pain    Christina Randall is a 34 y.o. female.  The history is provided by the patient. No language interpreter was used.  Knee Pain Location:  Knee Injury: no   Knee location:  L knee Pain details:    Quality:  Aching   Radiates to:  Does not radiate   Severity:  Moderate   Onset quality:  Gradual   Timing:  Constant   Progression:  Worsening Chronicity:  New Foreign body present:  No foreign bodies Relieved by:  Nothing Worsened by:  Nothing Ineffective treatments:  None tried Risk factors: no concern for non-accidental trauma        Past Medical History:  Diagnosis Date  . Anemia   . Asthma   . Back pain   . Frequent headaches   . Herniated disc   . Herpes   . Hypertension   . No pertinent past medical history   . Pregnant 02/09/2014  . Scoliosis   . Spotting during pregnancy in first trimester 02/09/2014    Patient Active Problem List   Diagnosis Date Noted  . Right thyroid nodule 05/30/2019  . Encounter for gynecological examination with Papanicolaou smear of cervix 05/30/2019  . Routine medical exam 05/30/2019  . 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 Uterine fibroid during pregnancy, antepartum 03/31/2014  . Opiate dependence ([place]) 03/31/2014  . H N P-LUMBAR 01/05/2009  . DEGENERATIVE JOINT DISEASE, KNEE 11/26/2008  . DERANGEMENT MENISCUS 11/26/2008  . 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 BACK PAIN 11/26/2008    Past Surgical History:  Procedure Laterality Date  . ANAL FISSURE REPAIR    . LAPAROSCOPIC BILATERAL SALPINGECTOMY Bilateral 12/30/2014   Procedure: LAPAROSCOPIC BILATERAL SALPINGECTOMY;  Surgeon: Florian Buff, MD;  Location: [facility];  Service: Gynecology;  Laterality: Bilateral;  . NO PAST SURGERIES       OB History    Gravida  4   Para  3   Term  3   Preterm  0   AB   1   Living  3     SAB  0   TAB  1   Ectopic  0   Multiple  0   Live Births  3           Family History  Problem Relation Age of Onset  . Hypertension Father   . Diabetes Maternal Grandmother   . Cancer Maternal Grandmother        pancreatic  . Cancer Maternal Grandfather        lungs    Social History   Tobacco Use  . Smoking status: Never Smoker  . Smokeless tobacco: Never Used  Vaping Use  . Vaping Use: Never used  Substance Use Topics  . Alcohol use: No  . Drug use: No    Home Medications Prior to Admission medications   Medication Sig Start Date End Date Taking? Authorizing Provider  diclofenac (VOLTAREN) 75 MG EC tablet Take 1 tablet (75 mg total) by mouth 2 (two) times daily. 01/26/20   Fransico Meadow, PA-C  hydrochlorothiazide (HYDRODIURIL) 25 MG tablet Take 25 mg by mouth daily. 02/23/19   [provider]  ibuprofen (ADVIL,MOTRIN) 800 MG tablet Take 1 tablet (800 mg total) 3 (three) times daily by mouth. 01/16/17   Triplett, Tammy, PA-C  phentermine 37.5 MG  capsule Take 37.5 mg by mouth daily. 05/17/19   [provider]  albuterol (PROVENTIL HFA;VENTOLIN HFA) 108 (90 Base) MCG/ACT inhaler Inhale 1-2 puffs every 6 (six) hours as needed into the lungs for wheezing or shortness of breath. 01/16/17 03/10/19  Triplett, Tammy, PA-C  dicyclomine (BENTYL) 20 MG tablet Take 1 tablet (20 mg total) by mouth 2 (two) times daily. 01/22/19 03/10/19  Frederica Kuster, PA-C    Allergies    Patient has no known allergies.  Review of Systems   Review of Systems  All other systems reviewed and are negative.   Physical Exam Updated Vital Signs BP (!) 149/96 (BP Location: Right Arm)   Pulse 71   Temp 97.6 F (36.4 C) (Oral)   Resp 18   Ht 5\' 8"  (1.727 m)   Wt 122.5 kg   LMP 01/17/2020   SpO2 100%   BMI 41.05 kg/m   Physical Exam Vitals and nursing note reviewed.  Constitutional:      Appearance: She is well-developed.  HENT:     Head:  Normocephalic.  Cardiovascular:     Rate and Rhythm: Normal rate.  Pulmonary:     Effort: Pulmonary effort is normal.  Musculoskeletal:        General: Swelling and tenderness present. Normal range of motion.     Cervical back: Normal range of motion.  Skin:    General: Skin is warm.  Neurological:     General: No focal deficit present.     Mental Status: She is alert and oriented to person, place, and time.  Psychiatric:        Mood and Affect: Mood normal.     ED Results / Procedures / Treatments   Labs (all labs ordered are listed, but only abnormal results are displayed) Labs Reviewed - No data to display  EKG None  Radiology DG Knee Complete 4 Views Left  Result Date: 01/26/2020 CLINICAL DATA:  34 year old female with left knee pain. No known injury. EXAM: LEFT KNEE - COMPLETE 4+ VIEW COMPARISON:  None. FINDINGS: There is no acute fracture or dislocation. No significant arthritic changes. The bones are well mineralized. There is a small suprapatellar effusion. The soft tissues are unremarkable. IMPRESSION: 1. No acute fracture or dislocation. 2. Small suprapatellar effusion. Electronically Signed   By: Anner Crete M.D.   On: 01/26/2020 16:02    Procedures Procedures (including critical care time)  Medications Ordered in ED Medications - No data to display  ED Course  I have reviewed the triage vital signs and the nursing notes.  Pertinent labs & imaging results that were available during my care of the patient were reviewed by me and considered in my medical decision making (see chart for details).    MDM Rules/Calculators/A&P                          MDM:  Xray shows effusion, Pt placed in a knee sleeve and given rx for voltaren.  Pt advised to follow up with Orthopaedist for evalatuion Final Clinical Impression(s) / ED Diagnoses Final diagnoses:  Acute pain of left knee    Rx / DC Orders ED Discharge Orders         Ordered    diclofenac (VOLTAREN)  75 MG EC tablet  2 times daily        01/26/20 1621        An After Visit Summary was printed and given to the  patient.    Fransico Meadow, [place] 01/26/20 1625    Fredia Sorrow, MD 02/10/20 1416

## 2020-01-28 ENCOUNTER — Ambulatory Visit (INDEPENDENT_AMBULATORY_CARE_PROVIDER_SITE_OTHER): Payer: 59 | Admitting: Orthopedic Surgery

## 2020-01-28 ENCOUNTER — Other Ambulatory Visit: Payer: Self-pay

## 2020-01-28 ENCOUNTER — Encounter: Payer: Self-pay | Admitting: Orthopedic Surgery

## 2020-01-28 VITALS — BP 154/86 | HR 83 | Ht 68.0 in | Wt 285.0 lb

## 2020-01-28 DIAGNOSIS — Z6841 Body Mass Index (BMI) 40.0 and over, adult: Secondary | ICD-10-CM | POA: Diagnosis not present

## 2020-01-28 DIAGNOSIS — M7122 Synovial cyst of popliteal space [Baker], left knee: Secondary | ICD-10-CM

## 2020-01-28 NOTE — Progress Notes (Addendum)
New Patient Visit  Assessment: Christina Randall is a 34 y.o. female with the following: 1.  Baker's cyst of left knee; possible medial meniscus tear  Plan: [name] has acute pain in her left knee, which came on atraumatically.  She has had issues with this knee previously, but reports a sudden onset of worsening pain.  She has a Bakers cyst on physical exam, which is tender to palpation.  She also has tenderness to palpation along the medial joint line and an overall joint effusion.  She may have sustained a medial meniscus tear.  I discussed the natural progression of such an injury, and she would like to proceed with medications, ice for the swelling as well as home exercises.  If she has not made progress with her range of motion and the swelling in her left knee in approximately 6 weeks, we will likely discuss obtaining an MRI.  She is aware that obtaining an MRI does not mean she will require surgery.  I encouraged her to use the prescription Voltaren that was provided by the emergency department, as well as the compression sleeve for her knee.   The patient meets the AMA guidelines for Morbid obesity with BMI > 40.  The patient has been counseled on weight loss.   Follow-up: Return in about 6 weeks (around 03/10/2020).  Subjective:  Chief Complaint  Patient presents with  . Knee Pain    left knee pain, 10/10 today to walk, sit or stand    History of Present Illness: Christina Randall is a 34 y.o. female who presents for evaluation of her left knee.  She states she has had pain in the left knee off and on for years, but reports a sudden onset of pain and swelling in the last week.  She presented to the emergency department for evaluation.  Prior to the onset of her pain, she denies a fall, twisting event or any type of injury.  She has had difficulty walking on her left knee.  She was given a prescription for for Voltaren in the emergency department, as well as a compression sleeve.   She just started using this medication.  She is not done any physical therapy for her knee.  No injury to this knee in the past.   Review of Systems: No fevers or chills No numbness or tingling No chest pain No shortness of breath No bowel or bladder dysfunction No GI distress No headaches   Medical History:  Past Medical History:  Diagnosis Date  . Anemia   . Asthma   . Back pain   . Frequent headaches   . Herniated disc   . Herpes   . Hypertension   . No pertinent past medical history   . Pregnant 02/09/2014  . Scoliosis   . Spotting during pregnancy in first trimester 02/09/2014    Past Surgical History:  Procedure Laterality Date  . ANAL FISSURE REPAIR    . LAPAROSCOPIC BILATERAL SALPINGECTOMY Bilateral 12/30/2014   Procedure: LAPAROSCOPIC BILATERAL SALPINGECTOMY;  Surgeon: Florian Buff, MD;  Location: [facility];  Service: Gynecology;  Laterality: Bilateral;  . NO PAST SURGERIES      Family History  Problem Relation Age of Onset  . Hypertension Father   . Diabetes Maternal Grandmother   . Cancer Maternal Grandmother        pancreatic  . Cancer Maternal Grandfather        lungs   Social History   Tobacco Use  .  Smoking status: Never Smoker  . Smokeless tobacco: Never Used  Vaping Use  . Vaping Use: Never used  Substance Use Topics  . Alcohol use: No  . Drug use: No    No Known Allergies  Current Meds  Medication Sig  . diclofenac (VOLTAREN) 75 MG EC tablet Take 1 tablet (75 mg total) by mouth 2 (two) times daily.  . hydrochlorothiazide (HYDRODIURIL) 25 MG tablet Take 25 mg by mouth daily.  Marland Kitchen ibuprofen (ADVIL,MOTRIN) 800 MG tablet Take 1 tablet (800 mg total) 3 (three) times daily by mouth.    Objective: BP (!) 154/86   Pulse 83   Ht 5\' 8"  (1.727 m)   Wt 285 lb (129.3 kg)   LMP 01/17/2020   BMI 43.33 kg/m   Physical Exam:  General: Alert and oriented, no acute distress Gait: Right-sided antalgic gait  Evaluation of the left knee  demonstrates a moderate effusion.  She has tenderness to palpation along the medial joint line.  She also has a fullness in the popliteal fossa, which is tender to palpation.  The pain in the back of her knee severely restricts her range of motion.  Is difficult to get her to full extension, and she has pain with flexion beyond 100 degrees.  Negative Lachman.  No increased laxity to varus or valgus stress.    IMAGING: I personally reviewed images previously obtained from the ED  X-rays of the left knee were obtained in the emergency department and demonstrate an effusion overall.  Mild loss of joint space within the medial compartment with small osteophytes.   New Medications:  No orders of the defined types were placed in this encounter.     Mordecai Rasmussen, MD  01/28/2020 9:09 AM

## 2020-01-28 NOTE — Patient Instructions (Signed)
Letter for work  Home exercises  Ice and voltaren   F/u 6 weeks - if not better, consider MRI

## 2020-02-03 ENCOUNTER — Other Ambulatory Visit: Payer: Self-pay

## 2020-02-03 ENCOUNTER — Encounter: Payer: Self-pay | Admitting: Adult Health

## 2020-02-03 ENCOUNTER — Ambulatory Visit (INDEPENDENT_AMBULATORY_CARE_PROVIDER_SITE_OTHER): Payer: 59 | Admitting: Adult Health

## 2020-02-03 VITALS — BP 128/73 | HR 90 | Ht 68.0 in | Wt 292.5 lb

## 2020-02-03 DIAGNOSIS — N921 Excessive and frequent menstruation with irregular cycle: Secondary | ICD-10-CM | POA: Insufficient documentation

## 2020-02-03 MED ORDER — MEGESTROL ACETATE 40 MG PO TABS: ORAL_TABLET | ORAL | 0 refills | Status: DC

## 2020-02-03 NOTE — Progress Notes (Signed)
  Subjective:     Patient ID: Christina Randall, female   DOB: 1985-03-09, 34 y.o.   MRN: [id number]  HPI Kabella is a 34 year old black female,single, [phone] in complaining of periods becoming mor heavy and this last one has lasted over 2 weeks, denies any pain. Changes pads every 2-3 hours and has clots  PCP is Dr Karie Kirks.  Review of Systems +heavy periods,has clots, changes pads every 2-3 hours  + longer periods    Reviewed past medical,surgical, social and family history. Reviewed medications and allergies.  Objective:   Physical Exam BP 128/73 (BP Location: Left Arm, Patient Position: Sitting, Cuff Size: Large)   Pulse 90   Ht 5\' 8"  (1.727 m)   Wt 292 lb 8 oz (132.7 kg)   LMP 01/17/2020   BMI 44.47 kg/m  Skin warm and dry.Pelvic: external genitalia is normal in appearance no lesions, vagina: pink mucous like blood without odor,urethra has no lesions or masses noted, cervix:smooth and bulbous, uterus: normal size, shape and contour, non tender, no masses felt, adnexa: no masses or tenderness noted. Bladder is non tender and no masses felt.  Examination chaperoned by Levy Pupa LPN  [facility] - 14/78/29 [phone]      Pregnancy Intention Screening   Does the patient want to become pregnant in the next year? No    Does the patient's partner want to become pregnant in the next year? No    Would the patient like to discuss contraceptive options today? No      Contraception Wrap Up   Current Method Female Sterilization    End Method Female Sterilization    Contraception Counseling Provided No             Assessment:     1. Menorrhagia with irregular cycle Will get GYN [place] to assess uterus Will rx megace to stop the bleeding  Discussed possible options of endometrial ablation, OCs or IUD to manage her periods, but will wait til [place] results back     Plan:     Return in 2 weeks for GYN [place] and see me after to discuss results and further options   Meds ordered this encounter   Medications  . megestrol (MEGACE) 40 MG tablet    Sig: Take 3 x 5 days then 2 x 5 days then 1 daily    Dispense:  45 tablet    Refill:  0    Order Specific Question:   Supervising Provider    Answer:   Florian Buff [[id number]]  re

## 2020-02-05 ENCOUNTER — Other Ambulatory Visit: Payer: Self-pay

## 2020-02-05 ENCOUNTER — Emergency Department (HOSPITAL_COMMUNITY)
Admission: EM | Admit: 2020-02-05 | Discharge: 2020-02-05 | Disposition: A | Discharge status: Home / Self Care | Payer: 59 | Attending: Emergency Medicine | Admitting: Emergency Medicine

## 2020-02-05 ENCOUNTER — Encounter (HOSPITAL_COMMUNITY): Payer: Self-pay

## 2020-02-05 DIAGNOSIS — Z20822 Contact with and (suspected) exposure to covid-19: Secondary | ICD-10-CM | POA: Diagnosis not present

## 2020-02-05 DIAGNOSIS — J45909 Unspecified asthma, uncomplicated: Secondary | ICD-10-CM | POA: Diagnosis not present

## 2020-02-05 DIAGNOSIS — R509 Fever, unspecified: Secondary | ICD-10-CM | POA: Diagnosis present

## 2020-02-05 DIAGNOSIS — B349 Viral infection, unspecified: Secondary | ICD-10-CM

## 2020-02-05 DIAGNOSIS — I10 Essential (primary) hypertension: Secondary | ICD-10-CM | POA: Diagnosis not present

## 2020-02-05 DIAGNOSIS — J069 Acute upper respiratory infection, unspecified: Secondary | ICD-10-CM | POA: Insufficient documentation

## 2020-02-05 LAB — URINALYSIS, ROUTINE W REFLEX MICROSCOPIC
Bacteria, UA: NONE SEEN
Bilirubin Urine: NEGATIVE
Glucose, UA: NEGATIVE mg/dL
Ketones, ur: NEGATIVE mg/dL
Nitrite: NEGATIVE
Protein, ur: NEGATIVE mg/dL
Specific Gravity, Urine: 1.016 (ref 1.005–1.030)
pH: 6 (ref 5.0–8.0)

## 2020-02-05 LAB — RESP PANEL BY RT-PCR (FLU A&B, COVID) ARPGX2
Influenza A by PCR: NEGATIVE
Influenza B by PCR: NEGATIVE
SARS Coronavirus 2 by RT PCR: NEGATIVE

## 2020-02-05 MED ORDER — ACETAMINOPHEN 325 MG PO TABS
650.0000 mg | ORAL_TABLET | Freq: Once | ORAL | Status: AC | PRN
  Administered 2020-02-05: 650 mg via ORAL
  Filled 2020-02-05: qty 2

## 2020-02-05 NOTE — ED Provider Notes (Signed)
[facility] EMERGENCY DEPARTMENT Provider Note   CSN: [id number] Arrival date & time: 02/05/20  1224     History Chief Complaint  Patient presents with  . Fever    Christina Randall is a 34 y.o. female.  The history is provided by the patient. No language interpreter was used.  Fever Max temp prior to arrival:  101 Temp source:  Oral Severity:  Moderate Onset quality:  Gradual Timing:  Constant Progression:  Worsening Chronicity:  New Relieved by:  Nothing Worsened by:  Nothing Ineffective treatments:  None tried Associated symptoms: no nausea   Risk factors: no recent sickness   Pt complains of fever and bodyaches.  Pt reports she has heavy periods.  Pt saw her GYn and is suppose to be on medication to slow her period  Pt has an ultrasound scheduled    Past Medical History:  Diagnosis Date  . Anemia   . Asthma   . Back pain   . Frequent headaches   . Herniated disc   . Herpes   . Hypertension   . No pertinent past medical history   . Pregnant 02/09/2014  . Scoliosis   . Spotting during pregnancy in first trimester 02/09/2014    Patient Active Problem List   Diagnosis Date Noted  . Menorrhagia with irregular cycle 02/03/2020  . Right thyroid nodule 05/30/2019  . Encounter for gynecological examination with Papanicolaou smear of cervix 05/30/2019  . Routine medical exam 05/30/2019  . 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 Uterine fibroid during pregnancy, antepartum 03/31/2014  . Opiate dependence ([place]) 03/31/2014  . H N P-LUMBAR 01/05/2009  . DEGENERATIVE JOINT DISEASE, KNEE 11/26/2008  . DERANGEMENT MENISCUS 11/26/2008  . 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 BACK PAIN 11/26/2008    Past Surgical History:  Procedure Laterality Date  . ANAL FISSURE REPAIR    . LAPAROSCOPIC BILATERAL SALPINGECTOMY Bilateral 12/30/2014   Procedure: LAPAROSCOPIC BILATERAL SALPINGECTOMY;  Surgeon: Florian Buff, MD;  Location: [facility];  Service: Gynecology;  Laterality:  Bilateral;  . NO PAST SURGERIES       OB History    Gravida  4   Para  3   Term  3   Preterm  0   AB  1   Living  3     SAB  0   TAB  1   Ectopic  0   Multiple  0   Live Births  3           Family History  Problem Relation Age of Onset  . Hypertension Father   . Diabetes Maternal Grandmother   . Cancer Maternal Grandmother        pancreatic  . Cancer Maternal Grandfather        lungs    Social History   Tobacco Use  . Smoking status: Never Smoker  . Smokeless tobacco: Never Used  Vaping Use  . Vaping Use: Never used  Substance Use Topics  . Alcohol use: No  . Drug use: No    Home Medications Prior to Admission medications   Medication Sig Start Date End Date Taking? Authorizing Provider  diclofenac (VOLTAREN) 75 MG EC tablet Take 1 tablet (75 mg total) by mouth 2 (two) times daily. 01/26/20   Fransico Meadow, PA-C  HYDROcodone-acetaminophen (NORCO) 10-325 MG tablet Take 1 tablet by mouth as needed.    [provider]  ibuprofen (ADVIL,MOTRIN) 800 MG tablet Take 1 tablet (800 mg total) 3 (three) times daily by  mouth. Patient taking differently: Take 800 mg by mouth as needed.  01/16/17   Triplett, Tammy, PA-C  megestrol (MEGACE) 40 MG tablet Take 3 x 5 days then 2 x 5 days then 1 daily 02/03/20   Estill Dooms, NP  albuterol (PROVENTIL HFA;VENTOLIN HFA) 108 (90 Base) MCG/ACT inhaler Inhale 1-2 puffs every 6 (six) hours as needed into the lungs for wheezing or shortness of breath. 01/16/17 03/10/19  Triplett, Tammy, PA-C  dicyclomine (BENTYL) 20 MG tablet Take 1 tablet (20 mg total) by mouth 2 (two) times daily. 01/22/19 03/10/19  Frederica Kuster, PA-C    Allergies    Patient has no known allergies.  Review of Systems   Review of Systems  Constitutional: Positive for fever.  Gastrointestinal: Negative for nausea.  All other systems reviewed and are negative.   Physical Exam Updated Vital Signs BP 126/78 (BP Location: Right Arm)    Pulse (!) 102   Temp 99.3 F (37.4 C) (Oral)   Resp 19   Ht 5\' 8"  (1.727 m)   Wt 129.3 kg   LMP 01/17/2020   SpO2 97%   BMI 43.33 kg/m   Physical Exam Vitals and nursing note reviewed.  Constitutional:      Appearance: She is well-developed.  HENT:     Head: Normocephalic.  Cardiovascular:     Rate and Rhythm: Normal rate.     Pulses: Normal pulses.  Pulmonary:     Effort: Pulmonary effort is normal.  Abdominal:     General: There is no distension.  Musculoskeletal:        General: Normal range of motion.     Cervical back: Normal range of motion.  Skin:    General: Skin is warm.  Neurological:     General: No focal deficit present.     Mental Status: She is alert and oriented to person, place, and time.  Psychiatric:        Mood and Affect: Mood normal.     ED Results / Procedures / Treatments   Labs (all labs ordered are listed, but only abnormal results are displayed) Labs Reviewed  URINALYSIS, ROUTINE W REFLEX MICROSCOPIC - Abnormal; Notable for the following components:      Result Value   Hgb urine dipstick MODERATE (*)    Leukocytes,Ua TRACE (*)    All other components within normal limits  RESP PANEL BY RT-PCR (FLU A&B, COVID) ARPGX2    EKG None  Radiology No results found.  Procedures Procedures (including critical care time)  Medications Ordered in ED Medications  acetaminophen (TYLENOL) tablet 650 mg (650 mg Oral Given 02/05/20 1247)    ED Course  I have reviewed the triage vital signs and the nursing notes.  Pertinent labs & imaging results that were available during my care of the patient were reviewed by me and considered in my medical decision making (see chart for details).    MDM Rules/Calculators/A&P                          MDM:  Ua is negative, covid and influenza are negative.  Final Clinical Impression(s) / ED Diagnoses Final diagnoses:  Viral illness  Upper respiratory tract infection, unspecified type    Rx / DC  Orders ED Discharge Orders    None    An After Visit Summary was printed and given to the patient.    Fransico Meadow, [place] 02/05/20 1540    [place],  Almyra Free, MD 02/06/20 [phone]

## 2020-02-05 NOTE — Discharge Instructions (Signed)
Return if any problems.  See your Physician as scheduled.  Tylenol every 4 hours

## 2020-02-05 NOTE — ED Triage Notes (Signed)
Pt reports fever, chills, HA, , abdominal pain since yesterday. Pt is not vaccinated

## 2020-02-07 ENCOUNTER — Other Ambulatory Visit: Payer: Self-pay

## 2020-02-07 ENCOUNTER — Emergency Department (HOSPITAL_COMMUNITY): Payer: 59

## 2020-02-07 ENCOUNTER — Encounter (HOSPITAL_COMMUNITY): Payer: Self-pay | Admitting: Emergency Medicine

## 2020-02-07 ENCOUNTER — Emergency Department (HOSPITAL_COMMUNITY)
Admission: EM | Admit: 2020-02-07 | Discharge: 2020-02-07 | Disposition: A | Discharge status: Home / Self Care | Payer: 59 | Attending: Emergency Medicine | Admitting: Emergency Medicine

## 2020-02-07 DIAGNOSIS — E876 Hypokalemia: Secondary | ICD-10-CM | POA: Insufficient documentation

## 2020-02-07 DIAGNOSIS — N939 Abnormal uterine and vaginal bleeding, unspecified: Secondary | ICD-10-CM | POA: Diagnosis present

## 2020-02-07 DIAGNOSIS — I10 Essential (primary) hypertension: Secondary | ICD-10-CM | POA: Diagnosis not present

## 2020-02-07 DIAGNOSIS — D649 Anemia, unspecified: Secondary | ICD-10-CM

## 2020-02-07 DIAGNOSIS — R102 Pelvic and perineal pain: Secondary | ICD-10-CM | POA: Diagnosis not present

## 2020-02-07 DIAGNOSIS — J45909 Unspecified asthma, uncomplicated: Secondary | ICD-10-CM | POA: Insufficient documentation

## 2020-02-07 LAB — COMPREHENSIVE METABOLIC PANEL
ALT: 16 U/L (ref 0–44)
AST: 15 U/L (ref 15–41)
Albumin: 3.1 g/dL — ABNORMAL LOW (ref 3.5–5.0)
Alkaline Phosphatase: 43 U/L (ref 38–126)
Anion gap: 9 (ref 5–15)
BUN: 9 mg/dL (ref 6–20)
CO2: 22 mmol/L (ref 22–32)
Calcium: 8.2 mg/dL — ABNORMAL LOW (ref 8.9–10.3)
Chloride: 104 mmol/L (ref 98–111)
Creatinine, Ser: 0.76 mg/dL (ref 0.44–1.00)
GFR, Estimated: >60 mL/min (ref >60)
Glucose, Bld: 141 mg/dL — ABNORMAL HIGH (ref 70–99)
Potassium: 2.6 mmol/L — CL (ref 3.5–5.1)
Sodium: 135 mmol/L (ref 135–145)
Total Bilirubin: 0.2 mg/dL — ABNORMAL LOW (ref 0.3–1.2)
Total Protein: 6.7 g/dL (ref 6.5–8.1)

## 2020-02-07 LAB — CBC WITH DIFFERENTIAL/PLATELET
Abs Immature Granulocytes: 0.02 10*3/uL (ref 0.00–0.07)
Basophils Absolute: 0 10*3/uL (ref 0.0–0.1)
Basophils Relative: 0 %
Eosinophils Absolute: 0.1 10*3/uL (ref 0.0–0.5)
Eosinophils Relative: 2 %
HCT: 26.2 % — ABNORMAL LOW (ref 36.0–46.0)
Hemoglobin: 7.9 g/dL — ABNORMAL LOW (ref 12.0–15.0)
Immature Granulocytes: 0 %
Lymphocytes Relative: 17 %
Lymphs Abs: 1 10*3/uL (ref 0.7–4.0)
MCH: 21.2 pg — ABNORMAL LOW (ref 26.0–34.0)
MCHC: 30.2 g/dL (ref 30.0–36.0)
MCV: 70.4 fL — ABNORMAL LOW (ref 80.0–100.0)
Monocytes Absolute: 0.4 10*3/uL (ref 0.1–1.0)
Monocytes Relative: 7 %
Neutro Abs: 4.1 10*3/uL (ref 1.7–7.7)
Neutrophils Relative %: 74 %
Platelets: 301 10*3/uL (ref 150–400)
RBC: 3.72 MIL/uL — ABNORMAL LOW (ref 3.87–5.11)
RDW: 16.8 % — ABNORMAL HIGH (ref 11.5–15.5)
WBC: 5.6 10*3/uL (ref 4.0–10.5)
nRBC: 0 % (ref 0.0–0.2)

## 2020-02-07 LAB — PREPARE RBC (CROSSMATCH)

## 2020-02-07 LAB — MAGNESIUM: Magnesium: 1.9 mg/dL (ref 1.7–2.4)

## 2020-02-07 MED ORDER — POTASSIUM CHLORIDE CRYS ER 20 MEQ PO TBCR
40.0000 meq | EXTENDED_RELEASE_TABLET | Freq: Once | ORAL | Status: AC
  Administered 2020-02-07: 40 meq via ORAL
  Filled 2020-02-07: qty 2

## 2020-02-07 MED ORDER — POTASSIUM CHLORIDE CRYS ER 20 MEQ PO TBCR: 20.0000 meq | EXTENDED_RELEASE_TABLET | Freq: Every day | ORAL | 0 refills | Status: DC

## 2020-02-07 MED ORDER — SODIUM CHLORIDE 0.9 % IV SOLN
10.0000 mL/h | Freq: Once | INTRAVENOUS | Status: AC
  Administered 2020-02-07: 10 mL/h via INTRAVENOUS

## 2020-02-07 MED ORDER — TRANEXAMIC ACID 650 MG PO TABS: 1300.0000 mg | ORAL_TABLET | Freq: Three times a day (TID) | ORAL | 0 refills | Status: AC

## 2020-02-07 MED ORDER — TRANEXAMIC ACID 650 MG PO TABS
1300.0000 mg | ORAL_TABLET | Freq: Once | ORAL | Status: AC
  Administered 2020-02-07: 1300 mg via ORAL
  Filled 2020-02-07 (×2): qty 2

## 2020-02-07 MED ORDER — POTASSIUM CHLORIDE 10 MEQ/100ML IV SOLN
10.0000 meq | INTRAVENOUS | Status: DC
  Administered 2020-02-07 (×2): 10 meq via INTRAVENOUS
  Filled 2020-02-07 (×3): qty 100

## 2020-02-07 NOTE — ED Triage Notes (Signed)
Pt was seen here on 02/05/20 for abdominal pain with chills. Pt returns for the same. Pt states her temp at home was 101 and she last took 1000 mg of tylenol at 0900.

## 2020-02-07 NOTE — ED Provider Notes (Signed)
Care assumed from [place], please see her note for full details, but in brief Christina Randall is a 34 y.o. female who presents for worsening vaginal bleeding despite being placed on TXA.  Heavy vaginal bleeding noted on pelvic exam and patient with hemoglobin of 7.9.  Also found to have hypokalemia of 2.6, normal magnesium.  IV and p.o. potassium replacement ordered.  OB/GYN consulted who recommends transfusing 1 unit and giving 1300 mg IV TXA, should see improvement in 1 to 2 hours, recommends repeat examination for bleeding, if this is improved patient can be discharged home on 5-day course of TXA, but if worsening will require transfer to women[facility] for admission and continued treatment.  Plan: Will reevaluate bleeding 2 hours after patient receives IV TXA  Physical Exam  BP 112/65   Pulse 77   Temp 98.4 F (36.9 C) (Oral)   Resp 18   Ht 5\' 8"  (1.727 m)   Wt 129.3 kg   LMP 01/17/2020   SpO2 100%   BMI 43.33 kg/m   Physical Exam Vitals and nursing note reviewed.  Constitutional:      General: She is not in acute distress.    Appearance: She is well-developed. She is not diaphoretic.     Comments: Well-appearing and in no distress  HENT:     Head: Normocephalic and atraumatic.  Eyes:     General:        Right eye: No discharge.        Left eye: No discharge.  Pulmonary:     Effort: Pulmonary effort is normal. No respiratory distress.  Genitourinary:    Comments: On repeat pelvic exam patient had a small amount of blood pooled in the vaginal vault, when this was cleared with Fox swabs she had no active vaginal bleeding, no large clots noted. Skin:    General: Skin is warm and dry.  Neurological:     Mental Status: She is alert and oriented to person, place, and time.     Coordination: Coordination normal.  Psychiatric:        Mood and Affect: Mood normal.        Behavior: Behavior normal.     ED Course/Procedures   Labs Reviewed  CBC WITH DIFFERENTIAL/PLATELET  - Abnormal; Notable for the following components:      Result Value   RBC 3.72 (*)    Hemoglobin 7.9 (*)    HCT 26.2 (*)    MCV 70.4 (*)    MCH 21.2 (*)    RDW 16.8 (*)    All other components within normal limits  COMPREHENSIVE METABOLIC PANEL - Abnormal; Notable for the following components:   Potassium 2.6 (*)    Glucose, Bld 141 (*)    Calcium 8.2 (*)    Albumin 3.1 (*)    Total Bilirubin 0.2 (*)    All other components within normal limits  MAGNESIUM  TYPE AND SCREEN  PREPARE RBC (CROSSMATCH)   US Pelvis Complete  Result Date: 02/07/2020 CLINICAL DATA:  Vaginal bleeding.  Pelvic pain. EXAM: TRANSABDOMINAL ULTRASOUND OF PELVIS TECHNIQUE: Transabdominal ultrasound examination of the pelvis was performed including evaluation of the uterus, ovaries, adnexal regions, and pelvic cul-de-sac. COMPARISON:  None. FINDINGS: Uterus Measurements: 16 x 7 x 9 cm = volume: 530 mL. Two discrete fibroids, largest in the left anterior mid uterine segment, 4.3 x 3.2 x 3.3 cm, second in the right posterior mid uterine segment, 2.1 x 1.8 x 2.2 cm. Endometrium Thickness: 11.  No focal abnormality visualized. Right ovary Measurements: 2.8 x 1.8 x 2.8 cm = volume: 5.9 mL. Normal appearance/no adnexal mass. Left ovary Measurements: 4.8 x 4.7 x 5.0 cm = volume: 56.5 mL. 3.3 cm cyst with no significant complicating features. Color Doppler blood flow is seen surrounding the cyst within the ovarian tissue. Other findings:  No abnormal free fluid. IMPRESSION: 1. No acute findings. 2. Enlarged uterus with 2 visualized fibroids. 3. 3.3 cm left ovarian cyst. This has benign characteristics and is a common finding in premenopausal females. No imaging follow up is required. This follows consensus guidelines: Simple Adnexal Cysts: SRU Consensus Conference Update on Follow-up and Reporting. Radiology 2019; [id number]. Electronically Signed   By: Lajean Manes M.D.   On: 02/07/2020 12:47     Procedures  MDM   After  patient received TXA repeat pelvic exam shows significant improvement in vaginal bleeding.  Cleared small amount of pooled blood from the vaginal vault with dark swabs and patient had no active bleeding or large clots present.  She is feeling improved and is ambulatory without near syncope or tachycardia.  Per Dr. Ilda Basset recommendation given improvement in bleeding will send home on 5 days of TXA 1300 mg 3 times daily and have her follow-up closely with her OB/GYN.  Potassium has been replaced here in the emergency department, will send patient home on 5 days of 20 mEq of potassium, will need to have this rechecked with her PCP this week.  Return precautions discussed, discharged home in good condition.    Jacqlyn Larsen, PA-C 02/07/20 [place], Ankit, MD 02/09/20 1454

## 2020-02-07 NOTE — ED Provider Notes (Signed)
[facility] EMERGENCY DEPARTMENT Provider Note   CSN: [id number] Arrival date & time: 02/07/20  1113     History Chief Complaint  Patient presents with  . Abdominal Pain    Christina Randall is a 34 y.o. female.  Pt seen on Dec 1 by Gyn for vaginal bleeding.  Pt reports she is taking megace as prescribed.  Pt reports no bleeding has increased today.  Pt complains of feeling weakness.  Pt reports some lower abdominal cramping.  Pt is scheduled to have a lower abdominal ultrasound.  Pt seen by me 2 days ago.  Pt reports tempertaure has resolved.   The history is provided by the patient. No language interpreter was used.       Past Medical History:  Diagnosis Date  . Anemia   . Asthma   . Back pain   . Frequent headaches   . Herniated disc   . Herpes   . Hypertension   . Scoliosis     Patient Active Problem List   Diagnosis Date Noted  . Menorrhagia with irregular cycle 02/03/2020  . Right thyroid nodule 05/30/2019  . Encounter for gynecological examination with Papanicolaou smear of cervix 05/30/2019  . Routine medical exam 05/30/2019  . 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 Uterine fibroid during pregnancy, antepartum 03/31/2014  . Opiate dependence ([place]) 03/31/2014  . H N P-LUMBAR 01/05/2009  . DEGENERATIVE JOINT DISEASE, KNEE 11/26/2008  . DERANGEMENT MENISCUS 11/26/2008  . 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 BACK PAIN 11/26/2008    Past Surgical History:  Procedure Laterality Date  . ANAL FISSURE REPAIR    . LAPAROSCOPIC BILATERAL SALPINGECTOMY Bilateral 12/30/2014   Procedure: LAPAROSCOPIC BILATERAL SALPINGECTOMY;  Surgeon: Florian Buff, MD;  Location: [facility];  Service: Gynecology;  Laterality: Bilateral;     OB History    Gravida  4   Para  3   Term  3   Preterm  0   AB  1   Living  3     SAB  0   TAB  1   Ectopic  0   Multiple  0   Live Births  3           Family History  Problem Relation Age of Onset  . Hypertension Father     . Diabetes Maternal Grandmother   . Cancer Maternal Grandmother        pancreatic  . Cancer Maternal Grandfather        lungs    Social History   Tobacco Use  . Smoking status: Never Smoker  . Smokeless tobacco: Never Used  Vaping Use  . Vaping Use: Never used  Substance Use Topics  . Alcohol use: No  . Drug use: No    Home Medications Prior to Admission medications   Medication Sig Start Date End Date Taking? Authorizing Provider  diclofenac (VOLTAREN) 75 MG EC tablet Take 1 tablet (75 mg total) by mouth 2 (two) times daily. 01/26/20  Yes Fransico Meadow, PA-C  HYDROcodone-acetaminophen (NORCO) 10-325 MG tablet Take 1 tablet by mouth as needed.    [provider]  ibuprofen (ADVIL,MOTRIN) 800 MG tablet Take 1 tablet (800 mg total) 3 (three) times daily by mouth. Patient taking differently: Take 800 mg by mouth as needed.  01/16/17   Triplett, Tammy, PA-C  megestrol (MEGACE) 40 MG tablet Take 3 x 5 days then 2 x 5 days then 1 daily 02/03/20   Estill Dooms, NP  albuterol (PROVENTIL  HFA;VENTOLIN HFA) 108 (90 Base) MCG/ACT inhaler Inhale 1-2 puffs every 6 (six) hours as needed into the lungs for wheezing or shortness of breath. 01/16/17 03/10/19  Triplett, Tammy, PA-C  dicyclomine (BENTYL) 20 MG tablet Take 1 tablet (20 mg total) by mouth 2 (two) times daily. 01/22/19 03/10/19  Frederica Kuster, PA-C    Allergies    Patient has no known allergies.  Review of Systems   Review of Systems  Genitourinary: Positive for vaginal bleeding.  All other systems reviewed and are negative.   Physical Exam Updated Vital Signs BP (!) 110/98   Pulse 90   Temp 98.5 F (36.9 C) (Oral)   Resp 16   Ht 5\' 8"  (1.727 m)   Wt 129.3 kg   LMP 01/17/2020   SpO2 100%   BMI 43.33 kg/m   Physical Exam Vitals and nursing note reviewed.  Constitutional:      Appearance: She is well-developed.  HENT:     Head: Normocephalic.  Cardiovascular:     Rate and Rhythm: Normal rate.      Heart sounds: Normal heart sounds.  Pulmonary:     Effort: Pulmonary effort is normal.  Abdominal:     General: Abdomen is flat. Bowel sounds are normal. There is no distension.  Genitourinary:    Cervix: Normal.     Uterus: Enlarged.   Musculoskeletal:        General: Normal range of motion.     Cervical back: Normal range of motion.  Skin:    General: Skin is warm.  Neurological:     General: No focal deficit present.     Mental Status: She is alert and oriented to person, place, and time.  Psychiatric:        Mood and Affect: Mood normal.     ED Results / Procedures / Treatments   Labs (all labs ordered are listed, but only abnormal results are displayed) Labs Reviewed  CBC WITH DIFFERENTIAL/PLATELET - Abnormal; Notable for the following components:      Result Value   RBC 3.72 (*)    Hemoglobin 7.9 (*)    HCT 26.2 (*)    MCV 70.4 (*)    MCH 21.2 (*)    RDW 16.8 (*)    All other components within normal limits  COMPREHENSIVE METABOLIC PANEL - Abnormal; Notable for the following components:   Potassium 2.6 (*)    Glucose, Bld 141 (*)    Calcium 8.2 (*)    Albumin 3.1 (*)    Total Bilirubin 0.2 (*)    All other components within normal limits  MAGNESIUM  TYPE AND SCREEN  PREPARE RBC (CROSSMATCH)    EKG None  Radiology US Pelvis Complete  Result Date: 02/07/2020 CLINICAL DATA:  Vaginal bleeding.  Pelvic pain. EXAM: TRANSABDOMINAL ULTRASOUND OF PELVIS TECHNIQUE: Transabdominal ultrasound examination of the pelvis was performed including evaluation of the uterus, ovaries, adnexal regions, and pelvic cul-de-sac. COMPARISON:  None. FINDINGS: Uterus Measurements: 16 x 7 x 9 cm = volume: 530 mL. Two discrete fibroids, largest in the left anterior mid uterine segment, 4.3 x 3.2 x 3.3 cm, second in the right posterior mid uterine segment, 2.1 x 1.8 x 2.2 cm. Endometrium Thickness: 11.  No focal abnormality visualized. Right ovary Measurements: 2.8 x 1.8 x 2.8 cm =  volume: 5.9 mL. Normal appearance/no adnexal mass. Left ovary Measurements: 4.8 x 4.7 x 5.0 cm = volume: 56.5 mL. 3.3 cm cyst with no significant complicating features. Color Doppler  blood flow is seen surrounding the cyst within the ovarian tissue. Other findings:  No abnormal free fluid. IMPRESSION: 1. No acute findings. 2. Enlarged uterus with 2 visualized fibroids. 3. 3.3 cm left ovarian cyst. This has benign characteristics and is a common finding in premenopausal females. No imaging follow up is required. This follows consensus guidelines: Simple Adnexal Cysts: SRU Consensus Conference Update on Follow-up and Reporting. Radiology 2019; [id number]. Electronically Signed   By: Lajean Manes M.D.   On: 02/07/2020 12:47    Procedures Procedures (including critical care time)  Medications Ordered in ED Medications  potassium chloride 10 mEq in 100 mL IVPB (10 mEq Intravenous New Bag/Given 02/07/20 1734)  potassium chloride SA (KLOR-CON) CR tablet 40 mEq (40 mEq Oral Given 02/07/20 1308)  tranexamic acid (LYSTEDA) tablet 1,300 mg (1,300 mg Oral Given 02/07/20 1904)  0.9 %  sodium chloride infusion (0 mL/hr Intravenous Stopped 02/07/20 1910)    ED Course  I have reviewed the triage vital signs and the nursing notes.  Pertinent labs & imaging results that were available during my care of the patient were reviewed by me and considered in my medical decision making (see chart for details).    MDM Rules/Calculators/A&P                          MDM: Pt's hemoglobin is  7.9 I spoke to Dr. Ilda Basset GYN who advised TXA  (Lysteda) IV.  Transfuse one unit as pt has active bleeding.  He advised if blesding decreases Lysteda 1300mg  tid.  If bleeding persist call him for admission.  Pt's care turned over to [facility] at &pm. Blood and TXA ordered.  Final Clinical Impression(s) / ED Diagnoses Final diagnoses:  Abnormal vaginal bleeding    Rx / DC Orders ED Discharge Orders    None       Sidney Ace 02/08/20 1638    Noemi Chapel, MD 02/10/20 [phone]

## 2020-02-07 NOTE — Discharge Instructions (Addendum)
STOP MEGACE  Begin taking TXA (Lysteda) 3 times daily.   See your GYN on Monday for recheck.  You potassium was low today take potassium tablets for the next 5 days and have this rechecked with your PCP this week.

## 2020-02-08 LAB — TYPE AND SCREEN
ABO/RH(D): O POS
Antibody Screen: NEGATIVE
Unit division: 0

## 2020-02-08 LAB — BPAM RBC
Blood Product Expiration Date: 202112212359
ISSUE DATE / TIME: 202112041830
Unit Type and Rh: 5100

## 2020-02-09 ENCOUNTER — Telehealth: Payer: Self-pay | Admitting: Adult Health

## 2020-02-09 ENCOUNTER — Telehealth: Payer: Self-pay | Admitting: *Deleted

## 2020-02-09 NOTE — Telephone Encounter (Signed)
Discharged from hospital 02/07/2020 & was told to follow up today with Anderson Malta, no available appointments, please advise

## 2020-02-09 NOTE — Telephone Encounter (Signed)
Megace did not stop bleeding seen in ER, had blood transfusion, still bleeding to come in am, to see Dr Elonda Husky at 9:10 am

## 2020-02-10 ENCOUNTER — Other Ambulatory Visit: Payer: Self-pay

## 2020-02-10 ENCOUNTER — Ambulatory Visit (INDEPENDENT_AMBULATORY_CARE_PROVIDER_SITE_OTHER): Payer: 59 | Admitting: Obstetrics & Gynecology

## 2020-02-10 ENCOUNTER — Encounter: Payer: Self-pay | Admitting: Obstetrics & Gynecology

## 2020-02-10 VITALS — BP 134/86 | HR 81 | Ht 68.0 in | Wt 285.0 lb

## 2020-02-10 DIAGNOSIS — D500 Iron deficiency anemia secondary to blood loss (chronic): Secondary | ICD-10-CM | POA: Diagnosis not present

## 2020-02-10 DIAGNOSIS — D259 Leiomyoma of uterus, unspecified: Secondary | ICD-10-CM

## 2020-02-10 DIAGNOSIS — N921 Excessive and frequent menstruation with irregular cycle: Secondary | ICD-10-CM

## 2020-02-10 MED ORDER — MEGESTROL ACETATE 40 MG PO TABS: ORAL_TABLET | ORAL | 3 refills | Status: DC

## 2020-02-10 NOTE — Progress Notes (Signed)
Preoperative History and Physical  Christina Randall is a 34 y.o. [phone] with Patient's last menstrual period was 01/17/2020. admitted for a abdominal supracervical hysterectomy thru mini lap with same day discharge.  Pt has known fibroids and menometrorrhagia resulting in anemia requiring blood transfusion despite megestrol therapy, MCV 70 On TXA Uterus is 16 weeks size, 530 cc  PMH:    Past Medical History:  Diagnosis Date  . Anemia   . Asthma   . Back pain   . Frequent headaches   . Herniated disc   . Herpes   . Hypertension   . Scoliosis     PSH:     Past Surgical History:  Procedure Laterality Date  . ANAL FISSURE REPAIR    . LAPAROSCOPIC BILATERAL SALPINGECTOMY Bilateral 12/30/2014   Procedure: LAPAROSCOPIC BILATERAL SALPINGECTOMY;  Surgeon: Florian Buff, MD;  Location: [facility];  Service: Gynecology;  Laterality: Bilateral;    POb/GynH:      OB History    Gravida  4   Para  3   Term  3   Preterm  0   AB  1   Living  3     SAB  0   TAB  1   Ectopic  0   Multiple  0   Live Births  3           SH:   Social History   Tobacco Use  . Smoking status: Never Smoker  . Smokeless tobacco: Never Used  Vaping Use  . Vaping Use: Never used  Substance Use Topics  . Alcohol use: No  . Drug use: No    FH:    Family History  Problem Relation Age of Onset  . Hypertension Father   . Diabetes Maternal Grandmother   . Cancer Maternal Grandmother        pancreatic  . Cancer Maternal Grandfather        lungs     Allergies: No Known Allergies  Medications:       Current Outpatient Medications:  .  diclofenac (VOLTAREN) 75 MG EC tablet, Take 1 tablet (75 mg total) by mouth 2 (two) times daily., Disp: 20 tablet, Rfl: 0 .  potassium chloride SA (KLOR-CON) 20 MEQ tablet, Take 1 tablet (20 mEq total) by mouth daily for 5 days., Disp: 5 tablet, Rfl: 0 .  tranexamic acid (LYSTEDA) 650 MG TABS tablet, Take 2 tablets (1,300 mg total) by mouth 3 (three)  times daily for 5 days., Disp: 30 tablet, Rfl: 0 .  ibuprofen (ADVIL,MOTRIN) 800 MG tablet, Take 1 tablet (800 mg total) 3 (three) times daily by mouth. (Patient not taking: Reported on 02/10/2020), Disp: 21 tablet, Rfl: 0 .  megestrol (MEGACE) 40 MG tablet, 3 tablets a day, Disp: 90 tablet, Rfl: 3  Review of Systems:   Review of Systems  Constitutional: Negative for fever, chills, weight loss, malaise/fatigue and diaphoresis.  HENT: Negative for hearing loss, ear pain, nosebleeds, congestion, sore throat, neck pain, tinnitus and ear discharge.   Eyes: Negative for blurred vision, double vision, photophobia, pain, discharge and redness.  Respiratory: Negative for cough, hemoptysis, sputum production, shortness of breath, wheezing and stridor.   Cardiovascular: Negative for chest pain, palpitations, orthopnea, claudication, leg swelling and PND.  Gastrointestinal: Positive for abdominal pain. Negative for heartburn, nausea, vomiting, diarrhea, constipation, blood in stool and melena.  Genitourinary: Negative for dysuria, urgency, frequency, hematuria and flank pain.  Musculoskeletal: Negative for myalgias, back pain, joint pain and falls.  Skin: Negative for itching and rash.  Neurological: Negative for dizziness, tingling, tremors, sensory change, speech change, focal weakness, seizures, loss of consciousness, weakness and headaches.  Endo/Heme/Allergies: Negative for environmental allergies and polydipsia. Does not bruise/bleed easily.  Psychiatric/Behavioral: Negative for depression, suicidal ideas, hallucinations, memory loss and substance abuse. The patient is not nervous/anxious and does not have insomnia.      PHYSICAL EXAM:  Blood pressure 134/86, pulse 81, height 5\' 8"  (1.727 m), weight 285 lb (129.3 kg), last menstrual period 01/17/2020.    Vitals reviewed. Constitutional: She is oriented to person, place, and time. She appears well-developed and well-nourished.  HENT:  Head:  Normocephalic and atraumatic.  Right Ear: External ear normal.  Left Ear: External ear normal.  Nose: Nose normal.  Mouth/Throat: Oropharynx is clear and moist.  Eyes: Conjunctivae and EOM are normal. Pupils are equal, round, and reactive to light. Right eye exhibits no discharge. Left eye exhibits no discharge. No scleral icterus.  Neck: Normal range of motion. Neck supple. No tracheal deviation present. No thyromegaly present.  Cardiovascular: Normal rate, regular rhythm, normal heart sounds and intact distal pulses.  Exam reveals no gallop and no friction rub.   No murmur heard. Respiratory: Effort normal and breath sounds normal. No respiratory distress. She has no wheezes. She has no rales. She exhibits no tenderness.  GI: Soft. Bowel sounds are normal. She exhibits no distension and no mass. There is tenderness. There is no rebound and no guarding.  Genitourinary:       Vulva is normal without lesions Vagina is pink moist without discharge Cervix normal in appearance and pap is normal Uterus is 16 weeks size Adnexa is negative with normal sized ovaries by sonogram  Musculoskeletal: Normal range of motion. She exhibits no edema and no tenderness.  Neurological: She is alert and oriented to person, place, and time. She has normal reflexes. She displays normal reflexes. No cranial nerve deficit. She exhibits normal muscle tone. Coordination normal.  Skin: Skin is warm and dry. No rash noted. No erythema. No pallor.  Psychiatric: She has a normal mood and affect. Her behavior is normal. Judgment and thought content normal.    Labs: Results for orders placed or performed during the hospital encounter of 02/07/20 (from the past 336 hour(s))  CBC with Differential/Platelet   Collection Time: 02/07/20 11:44 AM  Result Value Ref Range   WBC 5.6 4.0 - 10.5 K/uL   RBC 3.72 (L) 3.87 - 5.11 MIL/uL   Hemoglobin 7.9 (L) 12.0 - 15.0 g/dL   HCT 26.2 (L) 36 - 46 %   MCV 70.4 (L) 80.0 - 100.0 fL    MCH 21.2 (L) 26.0 - 34.0 pg   MCHC 30.2 30.0 - 36.0 g/dL   RDW 16.8 (H) 11.5 - 15.5 %   Platelets 301 150 - 400 K/uL   nRBC 0.0 0.0 - 0.2 %   Neutrophils Relative % 74 %   Neutro Abs 4.1 1.7 - 7.7 K/uL   Lymphocytes Relative 17 %   Lymphs Abs 1.0 0.7 - 4.0 K/uL   Monocytes Relative 7 %   Monocytes Absolute 0.4 0.1 - 1.0 K/uL   Eosinophils Relative 2 %   Eosinophils Absolute 0.1 0.0 - 0.5 K/uL   Basophils Relative 0 %   Basophils Absolute 0.0 0.0 - 0.1 K/uL   Immature Granulocytes 0 %   Abs Immature Granulocytes 0.02 0.00 - 0.07 K/uL  Comprehensive metabolic panel   Collection Time: 02/07/20 11:44 AM  Result  Value Ref Range   Sodium 135 135 - 145 mmol/L   Potassium 2.6 (LL) 3.5 - 5.1 mmol/L   Chloride 104 98 - 111 mmol/L   CO2 22 22 - 32 mmol/L   Glucose, Bld 141 (H) 70 - 99 mg/dL   BUN 9 6 - 20 mg/dL   Creatinine, Ser 0.76 0.44 - 1.00 mg/dL   Calcium 8.2 (L) 8.9 - 10.3 mg/dL   Total Protein 6.7 6.5 - 8.1 g/dL   Albumin 3.1 (L) 3.5 - 5.0 g/dL   AST 15 15 - 41 U/L   ALT 16 0 - 44 U/L   Alkaline Phosphatase 43 38 - 126 U/L   Total Bilirubin 0.2 (L) 0.3 - 1.2 mg/dL   GFR, Estimated >60 >60 mL/min   Anion gap 9 5 - 15  Magnesium   Collection Time: 02/07/20 11:44 AM  Result Value Ref Range   Magnesium 1.9 1.7 - 2.4 mg/dL  Type and screen [facility]   Collection Time: 02/07/20  5:14 PM  Result Value Ref Range   ABO/RH(D) O POS    Antibody Screen NEG    Sample Expiration 02/10/2020,2359    Unit Number [id number]    Blood Component Type RED CELLS,LR    Unit division 00    Status of Unit ISSUED,FINAL    Transfusion Status OK TO TRANSFUSE    Crossmatch Result      Compatible Performed at [facility], [address] [place], [place] [address]   BPAM [facility]   Collection Time: 02/07/20  5:14 PM  Result Value Ref Range   ISSUE DATE / TIME [id number]    Blood Product Unit Number [id number]    PRODUCT CODE [id number]    Unit Type and Rh 5100    Blood  Product Expiration Date [id number]   Prepare RBC (crossmatch)   Collection Time: 02/07/20  5:15 PM  Result Value Ref Range   Order Confirmation      ORDER PROCESSED BY BLOOD BANK Performed at [facility], [address], [place], [place] [address]   Results for orders placed or performed during the hospital encounter of 02/05/20 (from the past 336 hour(s))  Resp Panel by RT-PCR (Flu A&B, Covid) Nasopharyngeal Swab   Collection Time: 02/05/20 12:42 PM   Specimen: Nasopharyngeal Swab; Nasopharyngeal(NP) swabs in vial transport medium  Result Value Ref Range   SARS Coronavirus 2 by RT PCR NEGATIVE NEGATIVE   Influenza A by PCR NEGATIVE NEGATIVE   Influenza B by PCR NEGATIVE NEGATIVE  Urinalysis, Routine w reflex microscopic Urine, Clean Catch   Collection Time: 02/05/20  2:19 PM  Result Value Ref Range   Color, Urine YELLOW YELLOW   APPearance CLEAR CLEAR   Specific Gravity, Urine 1.016 1.005 - 1.030   pH 6.0 5.0 - 8.0   Glucose, UA NEGATIVE NEGATIVE mg/dL   Hgb urine dipstick MODERATE (A) NEGATIVE   Bilirubin Urine NEGATIVE NEGATIVE   Ketones, ur NEGATIVE NEGATIVE mg/dL   Protein, ur NEGATIVE NEGATIVE mg/dL   Nitrite NEGATIVE NEGATIVE   Leukocytes,Ua TRACE (A) NEGATIVE   RBC / HPF 0-5 0 - 5 RBC/hpf   WBC, UA 0-5 0 - 5 WBC/hpf   Bacteria, UA NONE SEEN NONE SEEN   Squamous Epithelial / LPF 0-5 0 - 5   Mucus PRESENT     EKG: Orders placed or performed in visit on 12/23/14  . EKG 12-Lead  . EKG 12-Lead    Imaging Studies: US Pelvis Complete  Result Date:  02/07/2020 CLINICAL DATA:  Vaginal bleeding.  Pelvic pain. EXAM: TRANSABDOMINAL ULTRASOUND OF PELVIS TECHNIQUE: Transabdominal ultrasound examination of the pelvis was performed including evaluation of the uterus, ovaries, adnexal regions, and pelvic cul-de-sac. COMPARISON:  None. FINDINGS: Uterus Measurements: 16 x 7 x 9 cm = volume: 530 mL. Two discrete fibroids, largest in the left anterior mid uterine segment, 4.3 x 3.2  x 3.3 cm, second in the right posterior mid uterine segment, 2.1 x 1.8 x 2.2 cm. Endometrium Thickness: 11.  No focal abnormality visualized. Right ovary Measurements: 2.8 x 1.8 x 2.8 cm = volume: 5.9 mL. Normal appearance/no adnexal mass. Left ovary Measurements: 4.8 x 4.7 x 5.0 cm = volume: 56.5 mL. 3.3 cm cyst with no significant complicating features. Color Doppler blood flow is seen surrounding the cyst within the ovarian tissue. Other findings:  No abnormal free fluid. IMPRESSION: 1. No acute findings. 2. Enlarged uterus with 2 visualized fibroids. 3. 3.3 cm left ovarian cyst. This has benign characteristics and is a common finding in premenopausal females. No imaging follow up is required. This follows consensus guidelines: Simple Adnexal Cysts: SRU Consensus Conference Update on Follow-up and Reporting. Radiology 2019; [id number]. Electronically Signed   By: Lajean Manes M.D.   On: 02/07/2020 12:47   DG Knee Complete 4 Views Left  Result Date: 01/26/2020 CLINICAL DATA:  34 year old female with left knee pain. No known injury. EXAM: LEFT KNEE - COMPLETE 4+ VIEW COMPARISON:  None. FINDINGS: There is no acute fracture or dislocation. No significant arthritic changes. The bones are well mineralized. There is a small suprapatellar effusion. The soft tissues are unremarkable. IMPRESSION: 1. No acute fracture or dislocation. 2. Small suprapatellar effusion. Electronically Signed   By: Anner Crete M.D.   On: 01/26/2020 16:02      Assessment: Menometrorrhagia Anemia due to menometrorrhagia requiring blood transfusion Fibroids contributing to menometrorrhagia, uterus 530 cc, 16 weeks size  Plan: Supracervical hysterectomy thru mini lap tentative 03/24/20, plan on same day discharge  Florian Buff 02/10/2020 10:04 AM       Face to face time:  20 minutes  Greater than 50% of the visit time was spent in counseling and coordination of care with the patient.  The summary and outline of the  counseling and care coordination is summarized in the note above.   All questions were answered.

## 2020-02-10 NOTE — Telephone Encounter (Signed)
Erroneous encounter

## 2020-02-17 ENCOUNTER — Telehealth: Payer: Self-pay | Admitting: Obstetrics & Gynecology

## 2020-02-17 NOTE — Telephone Encounter (Signed)
Patient states she is taking the Megace three times daily but is still continuing to have heavy bleeding, changing pad 2-3 times per hour. She is concerned about the bleeding.  Scheduled for surgery on 03/25/19. Please advise.

## 2020-02-17 NOTE — Telephone Encounter (Signed)
Patient advised per Dr Elonda Husky to stay on 3 megace a day as that is the strongest best thing hormonally to manage the bleeding. Hopefully it will begin to slow up and her blood counts go up a little prior to surgery.  Pt verbalized understanding and agreeable to plan.

## 2020-02-17 NOTE — Telephone Encounter (Signed)
I understand  Tell her to stay on 3 megace a day and that is the strongest best thing I have to hormonally manage the bleeding Hopefully it will begin to slow up and her blood counts go up a little prior to surgery

## 2020-02-18 ENCOUNTER — Ambulatory Visit: Payer: 59 | Admitting: Adult Health

## 2020-02-18 ENCOUNTER — Other Ambulatory Visit: Payer: 59

## 2020-03-01 ENCOUNTER — Telehealth: Payer: Self-pay

## 2020-03-01 NOTE — Telephone Encounter (Signed)
Heavy discharge and bleeding pt stated its been going on since "Nov. 13th". Pt will send picture via MyChart about the discharge.

## 2020-03-07 ENCOUNTER — Emergency Department (HOSPITAL_COMMUNITY)
Admission: EM | Admit: 2020-03-07 | Discharge: 2020-03-07 | Disposition: A | Discharge status: Home / Self Care | Payer: HRSA Program | Attending: Emergency Medicine | Admitting: Emergency Medicine

## 2020-03-07 ENCOUNTER — Encounter (HOSPITAL_COMMUNITY): Payer: Self-pay | Admitting: Emergency Medicine

## 2020-03-07 ENCOUNTER — Other Ambulatory Visit: Payer: Self-pay

## 2020-03-07 DIAGNOSIS — U071 COVID-19: Secondary | ICD-10-CM | POA: Insufficient documentation

## 2020-03-07 DIAGNOSIS — J45909 Unspecified asthma, uncomplicated: Secondary | ICD-10-CM | POA: Insufficient documentation

## 2020-03-07 DIAGNOSIS — I10 Essential (primary) hypertension: Secondary | ICD-10-CM | POA: Insufficient documentation

## 2020-03-07 DIAGNOSIS — Z79899 Other long term (current) drug therapy: Secondary | ICD-10-CM | POA: Insufficient documentation

## 2020-03-07 DIAGNOSIS — J029 Acute pharyngitis, unspecified: Secondary | ICD-10-CM | POA: Diagnosis present

## 2020-03-07 LAB — RESP PANEL BY RT-PCR (FLU A&B, COVID) ARPGX2
Influenza A by PCR: NEGATIVE
Influenza B by PCR: NEGATIVE
SARS Coronavirus 2 by RT PCR: POSITIVE — AB

## 2020-03-07 NOTE — ED Provider Notes (Signed)
[facility] EMERGENCY DEPARTMENT Provider Note   CSN: [id number] Arrival date & time: 03/07/20  1330     History Chief Complaint  Patient presents with  . Sore Throat    Christina Randall is a 35 y.o. female.  HPI 36 year old female with history of asthma, back pain, anemia presents to the ER with complaints of sore throat, headache, body aches which started yesterday.  She is not vaccinated.  Denies any nausea or vomiting.  No inability to swallow, no drooling.  Has not taken her temperature.  Endorses a headache but no visual changes, no neck stiffness.    Past Medical History:  Diagnosis Date  . Anemia   . Asthma   . Back pain   . Frequent headaches   . Herniated disc   . Herpes   . Hypertension   . Scoliosis     Patient Active Problem List   Diagnosis Date Noted  . Menorrhagia with irregular cycle 02/03/2020  . Right thyroid nodule 05/30/2019  . Encounter for gynecological examination with Papanicolaou smear of cervix 05/30/2019  . Routine medical exam 05/30/2019  . 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 Uterine fibroid during pregnancy, antepartum 03/31/2014  . Opiate dependence (HCC) 03/31/2014  . H N P-LUMBAR 01/05/2009  . DEGENERATIVE JOINT DISEASE, KNEE 11/26/2008  . DERANGEMENT MENISCUS 11/26/2008  . 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 BACK PAIN 11/26/2008    Past Surgical History:  Procedure Laterality Date  . ANAL FISSURE REPAIR    . LAPAROSCOPIC BILATERAL SALPINGECTOMY Bilateral 12/30/2014   Procedure: LAPAROSCOPIC BILATERAL SALPINGECTOMY;  Surgeon: Lazaro Arms, MD;  Location: [facility];  Service: Gynecology;  Laterality: Bilateral;     OB History    Gravida  4   Para  3   Term  3   Preterm  0   AB  1   Living  3     SAB  0   IAB  1   Ectopic  0   Multiple  0   Live Births  3           Family History  Problem Relation Age of Onset  . Hypertension Father   . Diabetes Maternal Grandmother   . Cancer Maternal Grandmother         pancreatic  . Cancer Maternal Grandfather        lungs    Social History   Tobacco Use  . Smoking status: Never Smoker  . Smokeless tobacco: Never Used  Vaping Use  . Vaping Use: Never used  Substance Use Topics  . Alcohol use: No  . Drug use: No    Home Medications Prior to Admission medications   Medication Sig Start Date End Date Taking? Authorizing Provider  diclofenac (VOLTAREN) 75 MG EC tablet Take 1 tablet (75 mg total) by mouth 2 (two) times daily. 01/26/20   Elson Areas, PA-C  ibuprofen (ADVIL,MOTRIN) 800 MG tablet Take 1 tablet (800 mg total) 3 (three) times daily by mouth. Patient not taking: Reported on 02/10/2020 01/16/17   Pauline Aus, PA-C  megestrol (MEGACE) 40 MG tablet 3 tablets a day 02/10/20   Lazaro Arms, MD  potassium chloride SA (KLOR-CON) 20 MEQ tablet Take 1 tablet (20 mEq total) by mouth daily for 5 days. 02/07/20 02/12/20  Dartha Lodge, PA-C  albuterol (PROVENTIL HFA;VENTOLIN HFA) 108 (90 Base) MCG/ACT inhaler Inhale 1-2 puffs every 6 (six) hours as needed into the lungs for wheezing or shortness of breath. 01/16/17 03/10/19  Triplett, Tammy, PA-C  dicyclomine (BENTYL) 20 MG tablet Take 1 tablet (20 mg total) by mouth 2 (two) times daily. 01/22/19 03/10/19  Emi Holes, PA-C    Allergies    Patient has no known allergies.  Review of Systems   Review of Systems  Constitutional: Positive for activity change, appetite change, chills, fatigue and fever.  HENT: Positive for sore throat. Negative for trouble swallowing and voice change.   Respiratory: Positive for cough and shortness of breath.   Cardiovascular: Negative for chest pain.  Gastrointestinal: Negative for abdominal pain, diarrhea, nausea and vomiting.  Genitourinary: Negative for dysuria.  Neurological: Positive for weakness and headaches.    Physical Exam Updated Vital Signs BP 134/89 (BP Location: Right Arm)   Pulse 91   Temp 99.2 F (37.3 C) (Oral)   Resp 16   Ht 5\' 8"   (1.727 m)   Wt 129.3 kg   SpO2 100%   BMI 43.33 kg/m   Physical Exam Vitals and nursing note reviewed.  Constitutional:      General: She is not in acute distress.    Appearance: She is well-developed and well-nourished. She is not ill-appearing or diaphoretic.  HENT:     Head: Normocephalic and atraumatic.     Mouth/Throat:     Tonsils: No tonsillar exudate.     Comments: Oropharynx non erythematous without exudates, uvula midline, no unilateral tonsillar swelling, tongue normal size and midline, no sublingual/submandibular swellimg, tolerating secretions well    Eyes:     Conjunctiva/sclera: Conjunctivae normal.  Cardiovascular:     Rate and Rhythm: Normal rate and regular rhythm.     Heart sounds: Normal heart sounds. No murmur heard.   Pulmonary:     Effort: Pulmonary effort is normal. No respiratory distress.     Breath sounds: Normal breath sounds.  Abdominal:     Palpations: Abdomen is soft.     Tenderness: There is no abdominal tenderness.  Musculoskeletal:        General: No edema.     Cervical back: Neck supple.  Skin:    General: Skin is warm and dry.  Neurological:     General: No focal deficit present.     Mental Status: She is alert.  Psychiatric:        Mood and Affect: Mood and affect normal.     ED Results / Procedures / Treatments   Labs (all labs ordered are listed, but only abnormal results are displayed) Labs Reviewed  RESP PANEL BY RT-PCR (FLU A&B, COVID) ARPGX2 - Abnormal; Notable for the following components:      Result Value   SARS Coronavirus 2 by RT PCR POSITIVE (*)    All other components within normal limits    EKG None  Radiology No results found.  Procedures Procedures (including critical care time)  Medications Ordered in ED Medications - No data to display  ED Course  I have reviewed the triage vital signs and the nursing notes.  Pertinent labs & imaging results that were available during my care of the patient  were reviewed by me and considered in my medical decision making (see chart for details).    MDM Rules/Calculators/A&P                          Patient with COVID symptoms, confirmed with testing done here in the ED.  Patient is not vaccinated against COVID-19. Patient is afebrile, tolerating secretions.  No evidence  of Ludwig's, RPA/PTA on exam.  Lungs clear to auscultation bilaterally. Normal O2 saturation.Pt will be discharged with symptomatic treatment, home isolation precautions. Will provide Zofran. Return precautions discussed. Verbalizes understanding and is agreeable with plan. Pt is hemodynamically stable & in NAD prior to dc. Final Clinical Impression(s) / ED Diagnoses Final diagnoses:  COVID    Rx / DC Orders ED Discharge Orders    None       Lyndel Safe 03/07/20 1610    Davonna Belling, MD 03/08/20 0010

## 2020-03-07 NOTE — ED Notes (Signed)
Pt here for covid/flu sx  Chose not to take covid vaccines  Here for eval

## 2020-03-07 NOTE — Discharge Instructions (Addendum)
Please read instructions below.  You tested positive for COVID today.  You can alternate Tylenol/acetaminophen and Advil/ibuprofen/Motrin every 4 hours for sore throat, body aches, headache or fever.  Drink plenty of water.  Use saline nasal spray for congestion. You can take Tessalon every 8 hours as needed for cough. Wash your hands frequently. Return to the ER for significant shortness of breath, uncontrollable vomiting, severe chest pain, or other concerning symptoms.

## 2020-03-07 NOTE — ED Triage Notes (Signed)
Pt c/o sore throat, body aches, chills, HA.  Denies covid exposure.

## 2020-03-10 ENCOUNTER — Telehealth: Payer: Self-pay | Admitting: Orthopedic Surgery

## 2020-03-10 ENCOUNTER — Ambulatory Visit: Payer: 59 | Admitting: Orthopedic Surgery

## 2020-03-10 NOTE — Telephone Encounter (Signed)
In reviewing for clinic on 1/5, this patient was diagnosed as COVID+. She is scheduled for routine follow up for her knee. She does not need to be seen, and I suspect that she still needs to be in quarantine as she is symptomatic. Please let the patient know that we are cancelling her appointment and she can reschedule when she is feeling better.  I called the patient and she will call back at later time to reschedule if she needs a follow up.

## 2020-03-19 NOTE — Patient Instructions (Addendum)
Christina Randall  03/19/2020     @PREFPERIOPPHARMACY @   Your procedure is scheduled on  03/24/2020.  Report to [facility] at  0800  A.M.  Call this number if you have problems the morning of surgery:  [phone]   Remember:  Do not eat or drink after midnight except for Carb drink at 5 am                        Take these medicines the morning of surgery with A SIP OF WATER  None    Do not wear jewelry, make-up or nail polish.  Do not wear lotions, powders, or perfumes, or deodorant. Please brush your teeth.  Do not shave 48 hours prior to surgery.  Men may shave face and neck.  Do not bring valuables to the hospital.  [facility] is not responsible for any belongings or valuables.  Contacts, dentures or bridgework may not be worn into surgery.  Leave your suitcase in the car.  After surgery it may be brought to your room.  For patients admitted to the hospital, discharge time will be determined by your treatment team.  Patients discharged the day of surgery will not be allowed to drive home.   Name and phone number of your driver:   Family   Special instructions:  DO NOT smoke the morning of your procedure.      Bathe with 1/2 bottle of CHG the night before and the morning of your procedure. DO NOT use CHG on your face, hair or genitals. Dry off with a clean towel, out on clean clothes and put clean sheets on your bed to sleep on that night.  Please read over the following fact sheets that you were given. Anesthesia Post-op Instructions and Care and Recovery After Surgery       Supracervical Hysterectomy, Care After The following information offers guidance on how to care for yourself after your procedure. Your health care provider may also give you more specific instructions. If you have problems or questions, contact your health care provider. What can I expect after the procedure? After the procedure, it is common to have:  Discomfort, tenderness,  swelling, and bruising at the surgical site.  Abdominal pain. You will be given pain medicine to control it.  Vaginal bleeding and discharge. You may need to use a sanitary napkin after this procedure. Follow these instructions at home: Medicines  Take over-the-counter and prescription medicines only as told by your health care provider.  Do not take aspirin or ibuprofen (NSAIDs). These can cause bleeding. Ask your health care provider when it is safe to use aspirin or ibuprofen again.  Ask your health care provider if the medicine prescribed to you: ? Requires you to avoid driving or using machinery. ? Can cause constipation. You may need to take these actions to prevent or treat constipation:  Take over-the-counter or prescription medicines.  Eat foods that are high in fiber, such as beans, whole grains, and fresh fruits and vegetables.  Limit foods that are high in fat and processed sugars, such as fried or sweet foods. Incision care  Follow instructions from your health care provider about how to take care of your incisions. Make sure you: ? Wash your hands with soap and water for at least 20 seconds before and after you change your bandage (dressing). If soap and water are not available, use hand sanitizer. ? Change your  dressing as told by your health care provider. ? Leave stitches (sutures), skin glue, or adhesive strips in place. These skin closures may need to stay in place for 2 weeks or longer. If adhesive strip edges start to loosen and curl up, you may trim the loose edges. Do not remove adhesive strips completely unless your health care provider tells you to do that.  Check your incision areas every day for signs of infection. Check for: ? More redness, swelling, or pain. ? Fluid or blood. ? Warmth. ? Pus or a bad smell.  Do not take baths, swim, or use a hot tub until your health care provider approves. Ask your health care provider if you may take showers. You may  only be allowed to take sponge baths.   Activity  Get plenty of rest and sleep.  Try to have someone home with you for 1-2 weeks to help you with everyday chores.  Return to your normal activities as told by your health care provider. Ask your health care provider what activities are safe for you.  Do not lift anything that is heavier than 10 lb (4.5 kg), or the limit that you are told, until your health care provider says that it is safe.  Do not douche, use tampons, or have sex for at least 6 weeks, or until your health care provider says that it is safe.   Eating and drinking  Drink enough fluids to keep your urine pale yellow.  Do not drink alcohol until your health care provider says it is okay to do so. General instructions  Take over-the-counter and prescription medicines only as told by your health care provider. Finish all antibiotic medicine even when you start to feel better.  If you struggle with physical or emotional changes after your procedure, speak with your health care provider or a therapist.  Wear compression stockings as told by your health care provider. These stockings help to prevent blood clots and reduce swelling in your legs.  Keep all follow-up visits. This is important. Contact a health care provider if:  You have any of these signs of infection: ? More redness, swelling, or pain around your incision. ? Fluid or blood coming from your incision. ? Warmth coming from your incision. ? Pus or a bad smell coming from your incision. ? Chills or a fever.  An incision opens.  You feel dizzy or light-headed.  You have pain or bleeding when you urinate.  You have a rash, nausea, vomiting, or diarrhea that does not go away.  You have abnormal vaginal discharge.  You have pain that does not get better with medicine. Get help right away if:  You have a fever and your symptoms suddenly get worse.  You have severe abdominal pain.  You have chest  pain.  You have shortness of breath.  You faint.  You have pain, swelling, or redness in your leg.  You have heavy vaginal bleeding with blood clots, saturating through a sanitary napkin in less than 1 hour. These symptoms may represent a serious problem that is an emergency. Do not wait to see if the symptoms will go away. Get medical help right away. Call your local emergency services (911 in the [place] Do not drive yourself to the hospital. Summary  After the procedure, it is common to have some discomfort, tenderness, swelling, and bruising at the surgical site.  Take over-the-counter and prescription medicines only as told by your health care provider.  Do not  douche, use tampons, or have sex for at least 6 weeks, or until your health care provider says that it is safe.  Get help right away if you have heavy vaginal bleeding or severe abdominal pain. This information is not intended to replace advice given to you by your health care provider. Make sure you discuss any questions you have with your health care provider. Document Revised: 11/03/2019 Document Reviewed: 11/03/2019 Elsevier Patient Education  2021 [employer] Anesthesia, Adult, Care After This sheet gives you information about how to care for yourself after your procedure. Your health care provider may also give you more specific instructions. If you have problems or questions, contact your health care provider. What can I expect after the procedure? After the procedure, the following side effects are common:  Pain or discomfort at the IV site.  Nausea.  Vomiting.  Sore throat.  Trouble concentrating.  Feeling cold or chills.  Feeling weak or tired.  Sleepiness and fatigue.  Soreness and body aches. These side effects can affect parts of the body that were not involved in surgery. Follow these instructions at home: For the time period you were told by your health care provider:  Rest.  Do  not participate in activities where you could fall or become injured.  Do not drive or use machinery.  Do not drink alcohol.  Do not take sleeping pills or medicines that cause drowsiness.  Do not make important decisions or sign legal documents.  Do not take care of children on your own.   Eating and drinking  Follow any instructions from your health care provider about eating or drinking restrictions.  When you feel hungry, start by eating small amounts of foods that are soft and easy to digest (bland), such as toast. Gradually return to your regular diet.  Drink enough fluid to keep your urine pale yellow.  If you vomit, rehydrate by drinking water, juice, or clear broth. General instructions  If you have sleep apnea, surgery and certain medicines can increase your risk for breathing problems. Follow instructions from your health care provider about wearing your sleep device: ? Anytime you are sleeping, including during daytime naps. ? While taking prescription pain medicines, sleeping medicines, or medicines that make you drowsy.  Have a responsible adult stay with you for the time you are told. It is important to have someone help care for you until you are awake and alert.  Return to your normal activities as told by your health care provider. Ask your health care provider what activities are safe for you.  Take over-the-counter and prescription medicines only as told by your health care provider.  If you smoke, do not smoke without supervision.  Keep all follow-up visits as told by your health care provider. This is important. Contact a health care provider if:  You have nausea or vomiting that does not get better with medicine.  You cannot eat or drink without vomiting.  You have pain that does not get better with medicine.  You are unable to pass urine.  You develop a skin rash.  You have a fever.  You have redness around your IV site that gets worse. Get  help right away if:  You have difficulty breathing.  You have chest pain.  You have blood in your urine or stool, or you vomit blood. Summary  After the procedure, it is common to have a sore throat or nausea. It is also common to feel tired.  Have  a responsible adult stay with you for the time you are told. It is important to have someone help care for you until you are awake and alert.  When you feel hungry, start by eating small amounts of foods that are soft and easy to digest (bland), such as toast. Gradually return to your regular diet.  Drink enough fluid to keep your urine pale yellow.  Return to your normal activities as told by your health care provider. Ask your health care provider what activities are safe for you. This information is not intended to replace advice given to you by your health care provider. Make sure you discuss any questions you have with your health care provider. Document Revised: 11/06/2019 Document Reviewed: 06/05/2019 Elsevier Patient Education  2021 [employer].

## 2020-03-22 ENCOUNTER — Other Ambulatory Visit (HOSPITAL_COMMUNITY)
Admission: RE | Admit: 2020-03-22 | Discharge: 2020-03-22 | Disposition: A | Discharge status: Home / Self Care | Payer: HRSA Program | Source: Ambulatory Visit | Attending: Obstetrics & Gynecology | Admitting: Obstetrics & Gynecology

## 2020-03-22 ENCOUNTER — Other Ambulatory Visit: Payer: Self-pay

## 2020-03-22 ENCOUNTER — Encounter (HOSPITAL_COMMUNITY)
Admission: RE | Admit: 2020-03-22 | Discharge: 2020-03-22 | Disposition: A | Discharge status: Home / Self Care | Payer: Self-pay | Source: Ambulatory Visit | Attending: Obstetrics & Gynecology | Admitting: Obstetrics & Gynecology

## 2020-03-22 ENCOUNTER — Other Ambulatory Visit: Payer: Self-pay | Admitting: Obstetrics & Gynecology

## 2020-03-22 DIAGNOSIS — U071 COVID-19: Secondary | ICD-10-CM | POA: Insufficient documentation

## 2020-03-22 DIAGNOSIS — I10 Essential (primary) hypertension: Secondary | ICD-10-CM | POA: Insufficient documentation

## 2020-03-22 DIAGNOSIS — Z01812 Encounter for preprocedural laboratory examination: Secondary | ICD-10-CM | POA: Diagnosis present

## 2020-03-22 DIAGNOSIS — Z01818 Encounter for other preprocedural examination: Secondary | ICD-10-CM | POA: Insufficient documentation

## 2020-03-22 LAB — COMPREHENSIVE METABOLIC PANEL
ALT: 18 U/L (ref 0–44)
AST: 15 U/L (ref 15–41)
Albumin: 3.7 g/dL (ref 3.5–5.0)
Alkaline Phosphatase: 47 U/L (ref 38–126)
Anion gap: 6 (ref 5–15)
BUN: 15 mg/dL (ref 6–20)
CO2: 22 mmol/L (ref 22–32)
Calcium: 8.6 mg/dL — ABNORMAL LOW (ref 8.9–10.3)
Chloride: 109 mmol/L (ref 98–111)
Creatinine, Ser: 0.97 mg/dL (ref 0.44–1.00)
GFR, Estimated: >60 mL/min (ref >60)
Glucose, Bld: 85 mg/dL (ref 70–99)
Potassium: 3.6 mmol/L (ref 3.5–5.1)
Sodium: 137 mmol/L (ref 135–145)
Total Bilirubin: 0.5 mg/dL (ref 0.3–1.2)
Total Protein: 7.4 g/dL (ref 6.5–8.1)

## 2020-03-22 LAB — SARS CORONAVIRUS 2 (TAT 6-24 HRS): SARS Coronavirus 2: POSITIVE — AB

## 2020-03-22 LAB — URINALYSIS, MICROSCOPIC (REFLEX): RBC / HPF: >50 RBC/hpf (ref 0–5)

## 2020-03-22 LAB — RAPID HIV SCREEN (HIV 1/2 AB+AG)
HIV 1/2 Antibodies: NONREACTIVE
HIV-1 P24 Antigen - HIV24: NONREACTIVE

## 2020-03-22 LAB — CBC
HCT: 30 % — ABNORMAL LOW (ref 36.0–46.0)
Hemoglobin: 8.9 g/dL — ABNORMAL LOW (ref 12.0–15.0)
MCH: 20.8 pg — ABNORMAL LOW (ref 26.0–34.0)
MCHC: 29.7 g/dL — ABNORMAL LOW (ref 30.0–36.0)
MCV: 70.1 fL — ABNORMAL LOW (ref 80.0–100.0)
Platelets: 407 10*3/uL — ABNORMAL HIGH (ref 150–400)
RBC: 4.28 MIL/uL (ref 3.87–5.11)
RDW: 20.9 % — ABNORMAL HIGH (ref 11.5–15.5)
WBC: 4.6 10*3/uL (ref 4.0–10.5)
nRBC: 0 % (ref 0.0–0.2)

## 2020-03-22 LAB — TYPE AND SCREEN
ABO/RH(D): O POS
Antibody Screen: NEGATIVE

## 2020-03-22 LAB — URINALYSIS, ROUTINE W REFLEX MICROSCOPIC

## 2020-03-22 LAB — HCG, QUANTITATIVE, PREGNANCY: hCG, Beta Chain, Quant, S: <1 m[IU]/mL (ref <5)

## 2020-03-24 ENCOUNTER — Other Ambulatory Visit: Payer: Self-pay | Admitting: Adult Health

## 2020-03-24 MED ORDER — NORETHINDRONE ACETATE 5 MG PO TABS: 5.0000 mg | ORAL_TABLET | Freq: Every day | ORAL | 1 refills | Status: DC

## 2020-03-24 NOTE — Progress Notes (Signed)
Stop megace and lets try aygestin 5 mg

## 2020-03-29 DIAGNOSIS — Z029 Encounter for administrative examinations, unspecified: Secondary | ICD-10-CM

## 2020-03-31 ENCOUNTER — Encounter: Payer: Self-pay | Admitting: *Deleted

## 2020-04-04 ENCOUNTER — Other Ambulatory Visit: Payer: Self-pay | Admitting: Obstetrics & Gynecology

## 2020-04-06 ENCOUNTER — Encounter: Payer: 59 | Admitting: Obstetrics & Gynecology

## 2020-04-07 ENCOUNTER — Encounter (HOSPITAL_COMMUNITY): Admission: RE | Admit: 2020-04-07 | Payer: Medicaid Other | Source: Ambulatory Visit

## 2020-04-08 ENCOUNTER — Other Ambulatory Visit: Payer: Self-pay | Admitting: Adult Health

## 2020-04-08 ENCOUNTER — Telehealth: Payer: Self-pay | Admitting: Obstetrics & Gynecology

## 2020-04-08 MED ORDER — ESCITALOPRAM OXALATE 10 MG PO TABS: 10.0000 mg | ORAL_TABLET | Freq: Every day | ORAL | 2 refills | Status: DC

## 2020-04-08 NOTE — Telephone Encounter (Signed)
Contacted patient regarding surgery scheduled on 2/8 being cancelled due to Covid restrictions.  She will be contacted for reschedule when restrictions are lifted.  Postop appointment was cancelled.

## 2020-04-08 NOTE — Progress Notes (Signed)
Will rx lexapro ?

## 2020-04-16 ENCOUNTER — Telehealth: Payer: Self-pay | Admitting: Obstetrics & Gynecology

## 2020-04-16 NOTE — Telephone Encounter (Signed)
Patient called and stated that a medical form was supposed to be filled out by the provider and faxed back to her employer and she needs it filled out & faxed, so she can get paid. Her employer hasn't received it yet.. Per patient. Clinical staff will follow up with patient.

## 2020-04-20 ENCOUNTER — Encounter: Payer: Medicaid Other | Admitting: Obstetrics & Gynecology

## 2020-04-20 ENCOUNTER — Other Ambulatory Visit (HOSPITAL_COMMUNITY)
Admission: RE | Admit: 2020-04-20 | Discharge: 2020-04-20 | Disposition: A | Discharge status: Home / Self Care | Payer: Medicaid Other | Source: Ambulatory Visit | Attending: Obstetrics & Gynecology | Admitting: Obstetrics & Gynecology

## 2020-04-20 ENCOUNTER — Other Ambulatory Visit: Payer: Self-pay

## 2020-04-20 DIAGNOSIS — Z01818 Encounter for other preprocedural examination: Secondary | ICD-10-CM | POA: Insufficient documentation

## 2020-04-20 LAB — CBC WITH DIFFERENTIAL/PLATELET
Abs Immature Granulocytes: 0.01 10*3/uL (ref 0.00–0.07)
Basophils Absolute: 0 10*3/uL (ref 0.0–0.1)
Basophils Relative: 0 %
Eosinophils Absolute: 0.2 10*3/uL (ref 0.0–0.5)
Eosinophils Relative: 3 %
HCT: 30.9 % — ABNORMAL LOW (ref 36.0–46.0)
Hemoglobin: 9 g/dL — ABNORMAL LOW (ref 12.0–15.0)
Immature Granulocytes: 0 %
Lymphocytes Relative: 31 %
Lymphs Abs: 1.4 10*3/uL (ref 0.7–4.0)
MCH: 20.2 pg — ABNORMAL LOW (ref 26.0–34.0)
MCHC: 29.1 g/dL — ABNORMAL LOW (ref 30.0–36.0)
MCV: 69.3 fL — ABNORMAL LOW (ref 80.0–100.0)
Monocytes Absolute: 0.3 10*3/uL (ref 0.1–1.0)
Monocytes Relative: 6 %
Neutro Abs: 2.7 10*3/uL (ref 1.7–7.7)
Neutrophils Relative %: 60 %
Platelets: 400 10*3/uL (ref 150–400)
RBC: 4.46 MIL/uL (ref 3.87–5.11)
RDW: 21.6 % — ABNORMAL HIGH (ref 11.5–15.5)
WBC: 4.6 10*3/uL (ref 4.0–10.5)
nRBC: 0 % (ref 0.0–0.2)

## 2020-04-20 LAB — COMPREHENSIVE METABOLIC PANEL
ALT: 12 U/L (ref 0–44)
AST: 15 U/L (ref 15–41)
Albumin: 3.6 g/dL (ref 3.5–5.0)
Alkaline Phosphatase: 51 U/L (ref 38–126)
Anion gap: 5 (ref 5–15)
BUN: 12 mg/dL (ref 6–20)
CO2: 22 mmol/L (ref 22–32)
Calcium: 8.4 mg/dL — ABNORMAL LOW (ref 8.9–10.3)
Chloride: 108 mmol/L (ref 98–111)
Creatinine, Ser: 1.03 mg/dL — ABNORMAL HIGH (ref 0.44–1.00)
GFR, Estimated: >60 mL/min (ref >60)
Glucose, Bld: 128 mg/dL — ABNORMAL HIGH (ref 70–99)
Potassium: 3.6 mmol/L (ref 3.5–5.1)
Sodium: 135 mmol/L (ref 135–145)
Total Bilirubin: 0.4 mg/dL (ref 0.3–1.2)
Total Protein: 7.3 g/dL (ref 6.5–8.1)

## 2020-04-20 LAB — HCG, QUANTITATIVE, PREGNANCY: hCG, Beta Chain, Quant, S: <1 m[IU]/mL (ref <5)

## 2020-04-21 ENCOUNTER — Ambulatory Visit (HOSPITAL_COMMUNITY)
Admission: RE | Admit: 2020-04-21 | Discharge: 2020-04-21 | Disposition: A | Discharge status: Home / Self Care | Payer: Medicaid Other | Attending: Obstetrics & Gynecology | Admitting: Obstetrics & Gynecology

## 2020-04-21 ENCOUNTER — Other Ambulatory Visit: Payer: Self-pay

## 2020-04-21 ENCOUNTER — Encounter (HOSPITAL_COMMUNITY): Payer: Self-pay | Admitting: Obstetrics & Gynecology

## 2020-04-21 ENCOUNTER — Ambulatory Visit (HOSPITAL_COMMUNITY): Payer: Medicaid Other | Admitting: Anesthesiology

## 2020-04-21 ENCOUNTER — Encounter (HOSPITAL_COMMUNITY)
Admission: RE | Disposition: A | Discharge status: Home / Self Care | Payer: Self-pay | Source: Home / Self Care | Attending: Obstetrics & Gynecology

## 2020-04-21 DIAGNOSIS — D259 Leiomyoma of uterus, unspecified: Secondary | ICD-10-CM

## 2020-04-21 DIAGNOSIS — Z833 Family history of diabetes mellitus: Secondary | ICD-10-CM | POA: Insufficient documentation

## 2020-04-21 DIAGNOSIS — Z801 Family history of malignant neoplasm of trachea, bronchus and lung: Secondary | ICD-10-CM | POA: Insufficient documentation

## 2020-04-21 DIAGNOSIS — N921 Excessive and frequent menstruation with irregular cycle: Secondary | ICD-10-CM

## 2020-04-21 DIAGNOSIS — Z79899 Other long term (current) drug therapy: Secondary | ICD-10-CM | POA: Insufficient documentation

## 2020-04-21 DIAGNOSIS — D251 Intramural leiomyoma of uterus: Secondary | ICD-10-CM | POA: Insufficient documentation

## 2020-04-21 DIAGNOSIS — D500 Iron deficiency anemia secondary to blood loss (chronic): Secondary | ICD-10-CM

## 2020-04-21 DIAGNOSIS — Z791 Long term (current) use of non-steroidal anti-inflammatories (NSAID): Secondary | ICD-10-CM | POA: Insufficient documentation

## 2020-04-21 DIAGNOSIS — Z800 Family history of malignant neoplasm of digestive organs: Secondary | ICD-10-CM | POA: Insufficient documentation

## 2020-04-21 DIAGNOSIS — N800 Endometriosis of uterus: Secondary | ICD-10-CM | POA: Insufficient documentation

## 2020-04-21 DIAGNOSIS — Z8249 Family history of ischemic heart disease and other diseases of the circulatory system: Secondary | ICD-10-CM | POA: Insufficient documentation

## 2020-04-21 HISTORY — PX: SUPRACERVICAL ABDOMINAL HYSTERECTOMY: SHX5393

## 2020-04-21 SURGERY — HYSTERECTOMY, SUPRACERVICAL, ABDOMINAL
Anesthesia: General | Site: Abdomen

## 2020-04-21 MED ORDER — GLYCOPYRROLATE PF 0.2 MG/ML IJ SOSY
PREFILLED_SYRINGE | INTRAMUSCULAR | Status: AC
  Filled 2020-04-21: qty 1

## 2020-04-21 MED ORDER — DEXAMETHASONE SODIUM PHOSPHATE 10 MG/ML IJ SOLN
INTRAMUSCULAR | Status: DC | PRN
  Administered 2020-04-21: 10 mg via INTRAVENOUS

## 2020-04-21 MED ORDER — FENTANYL CITRATE (PF) 100 MCG/2ML IJ SOLN
INTRAMUSCULAR | Status: AC
  Filled 2020-04-21: qty 2

## 2020-04-21 MED ORDER — DEXAMETHASONE SODIUM PHOSPHATE 10 MG/ML IJ SOLN
INTRAMUSCULAR | Status: AC
  Filled 2020-04-21: qty 1

## 2020-04-21 MED ORDER — ONDANSETRON HCL 4 MG/2ML IJ SOLN
INTRAMUSCULAR | Status: DC | PRN
  Administered 2020-04-21: 4 mg via INTRAVENOUS

## 2020-04-21 MED ORDER — FENTANYL CITRATE (PF) 100 MCG/2ML IJ SOLN
INTRAMUSCULAR | Status: DC | PRN
  Administered 2020-04-21: 100 ug via INTRAVENOUS
  Administered 2020-04-21: 50 ug via INTRAVENOUS
  Administered 2020-04-21: 100 ug via INTRAVENOUS
  Administered 2020-04-21: 50 ug via INTRAVENOUS

## 2020-04-21 MED ORDER — ACETAMINOPHEN 10 MG/ML IV SOLN
1000.0000 mg | Freq: Four times a day (QID) | INTRAVENOUS | Status: DC
  Administered 2020-04-21: 1000 mg via INTRAVENOUS
  Filled 2020-04-21: qty 100

## 2020-04-21 MED ORDER — PROMETHAZINE HCL 25 MG/ML IJ SOLN
6.2500 mg | INTRAMUSCULAR | Status: DC | PRN
Start: 2020-04-21 — End: 2020-04-21

## 2020-04-21 MED ORDER — SODIUM CHLORIDE (PF) 0.9 % IJ SOLN
INTRAMUSCULAR | Status: AC
  Filled 2020-04-21: qty 40

## 2020-04-21 MED ORDER — GLYCOPYRROLATE 0.2 MG/ML IJ SOLN
INTRAMUSCULAR | Status: DC | PRN
  Administered 2020-04-21: .1 mg via INTRAVENOUS

## 2020-04-21 MED ORDER — HEMOSTATIC AGENTS (NO CHARGE) OPTIME
TOPICAL | Status: DC | PRN
  Administered 2020-04-21: 1 via TOPICAL

## 2020-04-21 MED ORDER — IBUPROFEN 800 MG PO TABS: 800.0000 mg | ORAL_TABLET | Freq: Three times a day (TID) | ORAL | 0 refills | Status: AC | PRN

## 2020-04-21 MED ORDER — FENTANYL CITRATE (PF) 100 MCG/2ML IJ SOLN
50.0000 ug | INTRAMUSCULAR | Status: AC | PRN
  Administered 2020-04-21: 50 ug via INTRAVENOUS
  Administered 2020-04-21: 100 ug via INTRAVENOUS
  Filled 2020-04-21: qty 2

## 2020-04-21 MED ORDER — CEFAZOLIN SODIUM-DEXTROSE 2-4 GM/100ML-% IV SOLN
INTRAVENOUS | Status: AC
  Filled 2020-04-21: qty 100

## 2020-04-21 MED ORDER — KETOROLAC TROMETHAMINE 10 MG PO TABS
10.0000 mg | ORAL_TABLET | Freq: Three times a day (TID) | ORAL | 0 refills | Status: DC | PRN
Start: 2020-04-21 — End: 2020-05-04

## 2020-04-21 MED ORDER — MEPERIDINE HCL 50 MG/ML IJ SOLN: 6.2500 mg | INTRAMUSCULAR | Status: DC | PRN

## 2020-04-21 MED ORDER — 0.9 % SODIUM CHLORIDE (POUR BTL) OPTIME
TOPICAL | Status: DC | PRN
  Administered 2020-04-21 (×3): 1000 mL

## 2020-04-21 MED ORDER — GABAPENTIN 300 MG PO CAPS
300.0000 mg | ORAL_CAPSULE | Freq: Three times a day (TID) | ORAL | 0 refills | Status: DC
Start: 2020-04-21 — End: 2020-05-04

## 2020-04-21 MED ORDER — KETOROLAC TROMETHAMINE 30 MG/ML IJ SOLN
30.0000 mg | Freq: Once | INTRAMUSCULAR | Status: AC
  Administered 2020-04-21: 30 mg via INTRAVENOUS

## 2020-04-21 MED ORDER — CEFAZOLIN SODIUM-DEXTROSE 2-4 GM/100ML-% IV SOLN
2.0000 g | INTRAVENOUS | Status: AC
  Administered 2020-04-21: 2 g via INTRAVENOUS

## 2020-04-21 MED ORDER — OXYCODONE-ACETAMINOPHEN 7.5-325 MG PO TABS
1.0000 | ORAL_TABLET | Freq: Four times a day (QID) | ORAL | Status: DC | PRN
  Administered 2020-04-21 (×2): 1 via ORAL
  Filled 2020-04-21 (×2): qty 1

## 2020-04-21 MED ORDER — SCOPOLAMINE 1 MG/3DAYS TD PT72
MEDICATED_PATCH | TRANSDERMAL | Status: AC
  Filled 2020-04-21: qty 1

## 2020-04-21 MED ORDER — SCOPOLAMINE 1 MG/3DAYS TD PT72
1.0000 | MEDICATED_PATCH | Freq: Once | TRANSDERMAL | Status: DC
  Administered 2020-04-21: 1.5 mg via TRANSDERMAL

## 2020-04-21 MED ORDER — LIDOCAINE HCL (CARDIAC) PF 50 MG/5ML IV SOSY
PREFILLED_SYRINGE | INTRAVENOUS | Status: DC | PRN
  Administered 2020-04-21: 100 mg via INTRAVENOUS

## 2020-04-21 MED ORDER — SUGAMMADEX SODIUM 200 MG/2ML IV SOLN
INTRAVENOUS | Status: DC | PRN
  Administered 2020-04-21: 200 mg via INTRAVENOUS

## 2020-04-21 MED ORDER — ROCURONIUM 10MG/ML (10ML) SYRINGE FOR MEDFUSION PUMP - OPTIME
INTRAVENOUS | Status: DC | PRN
  Administered 2020-04-21: 5 mg via INTRAVENOUS
  Administered 2020-04-21: 10 mg via INTRAVENOUS
  Administered 2020-04-21: 50 mg via INTRAVENOUS

## 2020-04-21 MED ORDER — POVIDONE-IODINE 10 % EX SWAB: 2.0000 "application " | Freq: Once | CUTANEOUS | Status: DC

## 2020-04-21 MED ORDER — SODIUM CHLORIDE 0.9 % IV SOLN
INTRAVENOUS | Status: DC | PRN
  Administered 2020-04-21: 60 mL

## 2020-04-21 MED ORDER — KETOROLAC TROMETHAMINE 30 MG/ML IJ SOLN
INTRAMUSCULAR | Status: AC
  Filled 2020-04-21: qty 1

## 2020-04-21 MED ORDER — LIDOCAINE HCL (PF) 2 % IJ SOLN
INTRAMUSCULAR | Status: AC
  Filled 2020-04-21: qty 5

## 2020-04-21 MED ORDER — CHLORHEXIDINE GLUCONATE 0.12 % MT SOLN
15.0000 mL | Freq: Once | OROMUCOSAL | Status: AC
  Administered 2020-04-21: 15 mL via OROMUCOSAL

## 2020-04-21 MED ORDER — HYDROMORPHONE HCL 1 MG/ML IJ SOLN: 0.2500 mg | INTRAMUSCULAR | Status: DC | PRN

## 2020-04-21 MED ORDER — PROPOFOL 10 MG/ML IV BOLUS
INTRAVENOUS | Status: DC | PRN
  Administered 2020-04-21: 200 mg via INTRAVENOUS

## 2020-04-21 MED ORDER — ONDANSETRON HCL 4 MG/2ML IJ SOLN
INTRAMUSCULAR | Status: AC
  Filled 2020-04-21: qty 2

## 2020-04-21 MED ORDER — ORAL CARE MOUTH RINSE: 15.0000 mL | Freq: Once | OROMUCOSAL | Status: AC

## 2020-04-21 MED ORDER — LACTATED RINGERS IV SOLN: INTRAVENOUS | Status: DC

## 2020-04-21 MED ORDER — MIDAZOLAM HCL 2 MG/2ML IJ SOLN
INTRAMUSCULAR | Status: AC
  Filled 2020-04-21: qty 2

## 2020-04-21 MED ORDER — ONDANSETRON 8 MG PO TBDP: 8.0000 mg | ORAL_TABLET | Freq: Three times a day (TID) | ORAL | 0 refills | Status: DC | PRN

## 2020-04-21 MED ORDER — OXYCODONE-ACETAMINOPHEN 7.5-325 MG PO TABS: 1.0000 | ORAL_TABLET | Freq: Four times a day (QID) | ORAL | 0 refills | Status: DC | PRN

## 2020-04-21 MED ORDER — BUPIVACAINE LIPOSOME 1.3 % IJ SUSP
INTRAMUSCULAR | Status: AC
  Filled 2020-04-21: qty 20

## 2020-04-21 MED ORDER — BUPIVACAINE LIPOSOME 1.3 % IJ SUSP
20.0000 mL | Freq: Once | INTRAMUSCULAR | Status: DC
  Filled 2020-04-21: qty 20

## 2020-04-21 MED ORDER — ROCURONIUM BROMIDE 10 MG/ML (PF) SYRINGE
PREFILLED_SYRINGE | INTRAVENOUS | Status: AC
  Filled 2020-04-21: qty 10

## 2020-04-21 MED ORDER — KETAMINE HCL 10 MG/ML IJ SOLN
INTRAMUSCULAR | Status: DC | PRN
  Administered 2020-04-21: 20 mg via INTRAVENOUS

## 2020-04-21 MED ORDER — KETAMINE HCL 50 MG/5ML IJ SOSY
PREFILLED_SYRINGE | INTRAMUSCULAR | Status: AC
  Filled 2020-04-21: qty 5

## 2020-04-21 MED ORDER — FENTANYL CITRATE (PF) 250 MCG/5ML IJ SOLN
INTRAMUSCULAR | Status: AC
  Filled 2020-04-21: qty 5

## 2020-04-21 MED ORDER — KETOROLAC TROMETHAMINE 60 MG/2ML IM SOLN
30.0000 mg | Freq: Once | INTRAMUSCULAR | Status: AC
  Administered 2020-04-21: 30 mg via INTRAMUSCULAR
  Filled 2020-04-21: qty 2

## 2020-04-21 MED ORDER — MIDAZOLAM HCL 5 MG/5ML IJ SOLN
INTRAMUSCULAR | Status: DC | PRN
  Administered 2020-04-21: 2 mg via INTRAVENOUS

## 2020-04-21 SURGICAL SUPPLY — 44 items
ADH SKN CLS APL DERMABOND .7 (GAUZE/BANDAGES/DRESSINGS) ×1
BAG HAMPER (MISCELLANEOUS) ×2 IMPLANT
BLADE SURG SZ10 CARB STEEL (BLADE) ×2 IMPLANT
CELLS DAT CNTRL 66122 CELL SVR (MISCELLANEOUS) ×1 IMPLANT
CLOTH BEACON ORANGE TIMEOUT ST (SAFETY) ×2 IMPLANT
COVER LIGHT HANDLE STERIS (MISCELLANEOUS) ×4 IMPLANT
COVER WAND RF STERILE (DRAPES) ×4 IMPLANT
DERMABOND ADVANCED (GAUZE/BANDAGES/DRESSINGS) ×1
DERMABOND ADVANCED .7 DNX12 (GAUZE/BANDAGES/DRESSINGS) ×1 IMPLANT
DRAPE WARM FLUID 44X44 (DRAPES) ×2 IMPLANT
DRSG OPSITE POSTOP 4X8 (GAUZE/BANDAGES/DRESSINGS) ×2 IMPLANT
ELECT REM PT RETURN 9FT ADLT (ELECTROSURGICAL) ×2
ELECTRODE REM PT RTRN 9FT ADLT (ELECTROSURGICAL) ×1 IMPLANT
GAUZE 4X4 16PLY RFD (DISPOSABLE) ×2 IMPLANT
GLOVE BIOGEL PI IND STRL 8 (GLOVE) ×1 IMPLANT
GLOVE BIOGEL PI INDICATOR 8 (GLOVE) ×1
GLOVE ECLIPSE 8.0 STRL XLNG CF (GLOVE) ×2 IMPLANT
GLOVE SURG UNDER POLY LF SZ7 (GLOVE) ×8 IMPLANT
GOWN STRL REUS W/TWL LRG LVL3 (GOWN DISPOSABLE) ×4 IMPLANT
GOWN STRL REUS W/TWL XL LVL3 (GOWN DISPOSABLE) ×2 IMPLANT
HEMOSTAT ARISTA ABSORB 3G PWDR (HEMOSTASIS) ×2 IMPLANT
INST SET MAJOR GENERAL (KITS) ×2 IMPLANT
KIT BLADEGUARD II DBL (SET/KITS/TRAYS/PACK) ×2 IMPLANT
KIT TURNOVER KIT A (KITS) ×2 IMPLANT
MANIFOLD NEPTUNE II (INSTRUMENTS) ×2 IMPLANT
NEEDLE HYPO 18GX1.5 BLUNT FILL (NEEDLE) ×2 IMPLANT
NEEDLE HYPO 21X1.5 SAFETY (NEEDLE) ×2 IMPLANT
NS IRRIG 1000ML POUR BTL (IV SOLUTION) ×6 IMPLANT
PACK MAJOR ABDOMINAL (CUSTOM PROCEDURE TRAY) ×2 IMPLANT
PAD ARMBOARD 7.5X6 YLW CONV (MISCELLANEOUS) ×2 IMPLANT
PENCIL SMOKE EVACUATOR (MISCELLANEOUS) ×2 IMPLANT
RTRCTR WOUND ALEXIS 18CM MED (MISCELLANEOUS) ×2
SET BASIN LINEN APH (SET/KITS/TRAYS/PACK) ×2 IMPLANT
SPONGE LAP 18X18 RF (DISPOSABLE) ×4 IMPLANT
SUT CHROMIC 0 CT 1 (SUTURE) ×2 IMPLANT
SUT MON AB 3-0 SH 27 (SUTURE) ×4 IMPLANT
SUT VIC AB 0 CT1 27 (SUTURE) ×6
SUT VIC AB 0 CT1 27XCR 8 STRN (SUTURE) ×3 IMPLANT
SUT VIC AB 0 CTX 36 (SUTURE) ×2
SUT VIC AB 0 CTX36XBRD ANTBCTR (SUTURE) ×1 IMPLANT
SUT VICRYL 3 0 (SUTURE) ×2 IMPLANT
SYR 20ML LL LF (SYRINGE) ×4 IMPLANT
TOWEL SURG RFD BLUE STRL DISP (DISPOSABLE) ×2 IMPLANT
TRAY FOLEY MTR SLVR 16FR STAT (SET/KITS/TRAYS/PACK) ×2 IMPLANT

## 2020-04-21 NOTE — Transfer of Care (Signed)
Immediate Anesthesia Transfer of Care Note  Patient: Christina Randall  Procedure(s) Performed: SUPRACERVICAL ABDOMINAL HYSTERECTOMY (N/A Abdomen)  Patient Location: PACU  Anesthesia Type:General  Level of Consciousness: drowsy  Airway & Oxygen Therapy: Patient Spontanous Breathing and Patient connected to face mask oxygen  Post-op Assessment: Report given to RN and Post -op Vital signs reviewed and stable  Post vital signs: Reviewed and stable  Last Vitals:  Vitals Value Taken Time  BP 142/87 04/21/20 1228  Temp    Pulse 92 04/21/20 1229  Resp 15 04/21/20 1229  SpO2 98 % 04/21/20 1229  Vitals shown include unvalidated device data.  Last Pain:  Vitals:   04/21/20 0820  TempSrc: Oral  PainSc: 0-No pain      Patients Stated Pain Goal: 5 ([id number])  Complications: No complications documented.

## 2020-04-21 NOTE — Anesthesia Postprocedure Evaluation (Signed)
Anesthesia Post Note  Patient: Christina Randall  Procedure(s) Performed: SUPRACERVICAL ABDOMINAL HYSTERECTOMY (N/A Abdomen)  Patient location during evaluation: PACU Anesthesia Type: General Level of consciousness: awake and alert, oriented and sedated Pain management: pain level controlled Vital Signs Assessment: post-procedure vital signs reviewed and stable Respiratory status: spontaneous breathing and respiratory function stable Cardiovascular status: blood pressure returned to baseline and stable Postop Assessment: no apparent nausea or vomiting Anesthetic complications: no Comments: Patient is under Dr. Brynda Greathouse supervision. PACU staff will discharge the patient as per Dr. Brynda Greathouse directions.   No complications documented.   Last Vitals:  Vitals:   04/21/20 1430 04/21/20 1445  BP: (!) 146/87 (!) 146/90  Pulse: (!) 53 (!) 56  Resp: 11 14  Temp:    SpO2: 99% 98%    Last Pain:  Vitals:   04/21/20 1430  TempSrc:   PainSc: 8                  Daniela Siebers C Tiari Andringa

## 2020-04-21 NOTE — Anesthesia Procedure Notes (Signed)
Procedure Name: Intubation Date/Time: 04/21/2020 10:45 AM Performed by: Ollen Bowl, CRNA Pre-anesthesia Checklist: Patient identified, Patient being monitored, Timeout performed, Emergency Drugs available and Suction available Patient Re-evaluated:Patient Re-evaluated prior to induction Oxygen Delivery Method: Circle system utilized Preoxygenation: Pre-oxygenation with 100% oxygen Induction Type: IV induction Ventilation: Mask ventilation without difficulty Laryngoscope Size: Mac and 3 Grade View: Grade I Tube type: Oral Tube size: 7.0 mm Number of attempts: 1 Airway Equipment and Method: Stylet Placement Confirmation: ETT inserted through vocal cords under direct vision,  positive ETCO2 and breath sounds checked- equal and bilateral Secured at: 21 cm Tube secured with: Tape Dental Injury: Teeth and Oropharynx as per pre-operative assessment

## 2020-04-21 NOTE — Anesthesia Preprocedure Evaluation (Addendum)
Anesthesia Evaluation  Patient identified by MRN, date of birth, ID band Patient awake    Reviewed: Allergy & Precautions, NPO status , Patient's Chart, lab work & pertinent test results  History of Anesthesia Complications Negative for: history of anesthetic complications  Airway Mallampati: II  TM Distance: >3 FB Neck ROM: Full    Dental  (+) Dental Advisory Given Upper permanent retainer :   Pulmonary asthma ,    Pulmonary exam normal breath sounds clear to auscultation       Cardiovascular Exercise Tolerance: Good hypertension, Pt. on medications Normal cardiovascular exam Rhythm:Regular Rate:Normal     Neuro/Psych  Headaches, negative psych ROS   GI/Hepatic negative GI ROS, Neg liver ROS,   Endo/Other  Morbid obesity  Renal/GU negative Renal ROS     Musculoskeletal  (+) Arthritis  (scoliosis, herniated disc, back pain),   Abdominal   Peds  Hematology  (+) anemia ,   Anesthesia Other Findings   Reproductive/Obstetrics                            Anesthesia Physical Anesthesia Plan  ASA: II  Anesthesia Plan: General   Post-op Pain Management:    Induction: Intravenous  PONV Risk Score and Plan: 4 or greater and Ondansetron, Dexamethasone, Midazolam and Scopolamine patch - Pre-op  Airway Management Planned: Oral ETT  Additional Equipment:   Intra-op Plan:   Post-operative Plan: Extubation in OR  Informed Consent: I have reviewed the patients History and Physical, chart, labs and discussed the procedure including the risks, benefits and alternatives for the proposed anesthesia with the patient or authorized representative who has indicated his/her understanding and acceptance.     Dental advisory given  Plan Discussed with: CRNA and Surgeon  Anesthesia Plan Comments:        Anesthesia Quick Evaluation

## 2020-04-21 NOTE — Progress Notes (Signed)
Patient has voided, small amount of vaginal bleeding noted, tolerating liquids, a lot of lower abdominal cramping. Color is pale at times. Pain is 10 out of 10, ok to give 2nd tablet of percocet ordered per Dr.Battula. Reviewed discharge instructions, medications and education, instructed to be seen immediately for any indications of infection or large amount of blood loss, on top of Dr.Eures discharge instructions. Patient verbalizes understanding.

## 2020-04-21 NOTE — H&P (Signed)
Preoperative History and Physical  Christina Randall is a 35 y.o. [phone] with Patient's last menstrual period was 01/17/2020. admitted for a abdominal supracervical hysterectomy thru mini lap with same day discharge.  Pt has known fibroids and menometrorrhagia resulting in anemia requiring blood transfusion despite megestrol therapy, MCV 70 On TXA Uterus is 16 weeks size, 530 cc  PMH:        Past Medical History:  Diagnosis Date  . Anemia   . Asthma   . Back pain   . Frequent headaches   . Herniated disc   . Herpes   . Hypertension   . Scoliosis     PSH:          Past Surgical History:  Procedure Laterality Date  . ANAL FISSURE REPAIR    . LAPAROSCOPIC BILATERAL SALPINGECTOMY Bilateral 12/30/2014   Procedure: LAPAROSCOPIC BILATERAL SALPINGECTOMY;  Surgeon: Florian Buff, MD;  Location: [facility];  Service: Gynecology;  Laterality: Bilateral;    POb/GynH:              OB History    Gravida  4   Para  3   Term  3   Preterm  0   AB  1   Living  3     SAB  0   TAB  1   Ectopic  0   Multiple  0   Live Births  3           SH:   Social History       Tobacco Use  . Smoking status: Never Smoker  . Smokeless tobacco: Never Used  Vaping Use  . Vaping Use: Never used  Substance Use Topics  . Alcohol use: No  . Drug use: No    FH:         Family History  Problem Relation Age of Onset  . Hypertension Father   . Diabetes Maternal Grandmother   . Cancer Maternal Grandmother        pancreatic  . Cancer Maternal Grandfather        lungs     Allergies: No Known Allergies  Medications:       Current Outpatient Medications:  .  diclofenac (VOLTAREN) 75 MG EC tablet, Take 1 tablet (75 mg total) by mouth 2 (two) times daily., Disp: 20 tablet, Rfl: 0 .  potassium chloride SA (KLOR-CON) 20 MEQ tablet, Take 1 tablet (20 mEq total) by mouth daily for 5 days., Disp: 5 tablet, Rfl: 0 .  tranexamic acid (LYSTEDA) 650  MG TABS tablet, Take 2 tablets (1,300 mg total) by mouth 3 (three) times daily for 5 days., Disp: 30 tablet, Rfl: 0 .  ibuprofen (ADVIL,MOTRIN) 800 MG tablet, Take 1 tablet (800 mg total) 3 (three) times daily by mouth. (Patient not taking: Reported on 02/10/2020), Disp: 21 tablet, Rfl: 0 .  megestrol (MEGACE) 40 MG tablet, 3 tablets a day, Disp: 90 tablet, Rfl: 3  Review of Systems:   Review of Systems  Constitutional: Negative for fever, chills, weight loss, malaise/fatigue and diaphoresis.  HENT: Negative for hearing loss, ear pain, nosebleeds, congestion, sore throat, neck pain, tinnitus and ear discharge.   Eyes: Negative for blurred vision, double vision, photophobia, pain, discharge and redness.  Respiratory: Negative for cough, hemoptysis, sputum production, shortness of breath, wheezing and stridor.   Cardiovascular: Negative for chest pain, palpitations, orthopnea, claudication, leg swelling and PND.  Gastrointestinal: Positive for abdominal pain. Negative for heartburn, nausea, vomiting, diarrhea, constipation, blood  in stool and melena.  Genitourinary: Negative for dysuria, urgency, frequency, hematuria and flank pain.  Musculoskeletal: Negative for myalgias, back pain, joint pain and falls.  Skin: Negative for itching and rash.  Neurological: Negative for dizziness, tingling, tremors, sensory change, speech change, focal weakness, seizures, loss of consciousness, weakness and headaches.  Endo/Heme/Allergies: Negative for environmental allergies and polydipsia. Does not bruise/bleed easily.  Psychiatric/Behavioral: Negative for depression, suicidal ideas, hallucinations, memory loss and substance abuse. The patient is not nervous/anxious and does not have insomnia.      PHYSICAL EXAM:  Blood pressure 134/86, pulse 81, height 5\' 8"  (1.727 m), weight 285 lb (129.3 kg), last menstrual period 01/17/2020.    Vitals reviewed. Constitutional: She is oriented to person, place,  and time. She appears well-developed and well-nourished.  HENT:  Head: Normocephalic and atraumatic.  Right Ear: External ear normal.  Left Ear: External ear normal.  Nose: Nose normal.  Mouth/Throat: Oropharynx is clear and moist.  Eyes: Conjunctivae and EOM are normal. Pupils are equal, round, and reactive to light. Right eye exhibits no discharge. Left eye exhibits no discharge. No scleral icterus.  Neck: Normal range of motion. Neck supple. No tracheal deviation present. No thyromegaly present.  Cardiovascular: Normal rate, regular rhythm, normal heart sounds and intact distal pulses.  Exam reveals no gallop and no friction rub.   No murmur heard. Respiratory: Effort normal and breath sounds normal. No respiratory distress. She has no wheezes. She has no rales. She exhibits no tenderness.  GI: Soft. Bowel sounds are normal. She exhibits no distension and no mass. There is tenderness. There is no rebound and no guarding.  Genitourinary:       Vulva is normal without lesions Vagina is pink moist without discharge Cervix normal in appearance and pap is normal Uterus is 16 weeks size Adnexa is negative with normal sized ovaries by sonogram  Musculoskeletal: Normal range of motion. She exhibits no edema and no tenderness.  Neurological: She is alert and oriented to person, place, and time. She has normal reflexes. She displays normal reflexes. No cranial nerve deficit. She exhibits normal muscle tone. Coordination normal.  Skin: Skin is warm and dry. No rash noted. No erythema. No pallor.  Psychiatric: She has a normal mood and affect. Her behavior is normal. Judgment and thought content normal.    Labs:       Results for orders placed or performed during the hospital encounter of 02/07/20 (from the past 336 hour(s))  CBC with Differential/Platelet   Collection Time: 02/07/20 11:44 AM  Result Value Ref Range   WBC 5.6 4.0 - 10.5 K/uL   RBC 3.72 (L) 3.87 - 5.11 MIL/uL    Hemoglobin 7.9 (L) 12.0 - 15.0 g/dL   HCT 26.2 (L) 36 - 46 %   MCV 70.4 (L) 80.0 - 100.0 fL   MCH 21.2 (L) 26.0 - 34.0 pg   MCHC 30.2 30.0 - 36.0 g/dL   RDW 16.8 (H) 11.5 - 15.5 %   Platelets 301 150 - 400 K/uL   nRBC 0.0 0.0 - 0.2 %   Neutrophils Relative % 74 %   Neutro Abs 4.1 1.7 - 7.7 K/uL   Lymphocytes Relative 17 %   Lymphs Abs 1.0 0.7 - 4.0 K/uL   Monocytes Relative 7 %   Monocytes Absolute 0.4 0.1 - 1.0 K/uL   Eosinophils Relative 2 %   Eosinophils Absolute 0.1 0.0 - 0.5 K/uL   Basophils Relative 0 %   Basophils Absolute 0.0  0.0 - 0.1 K/uL   Immature Granulocytes 0 %   Abs Immature Granulocytes 0.02 0.00 - 0.07 K/uL  Comprehensive metabolic panel   Collection Time: 02/07/20 11:44 AM  Result Value Ref Range   Sodium 135 135 - 145 mmol/L   Potassium 2.6 (LL) 3.5 - 5.1 mmol/L   Chloride 104 98 - 111 mmol/L   CO2 22 22 - 32 mmol/L   Glucose, Bld 141 (H) 70 - 99 mg/dL   BUN 9 6 - 20 mg/dL   Creatinine, Ser 0.76 0.44 - 1.00 mg/dL   Calcium 8.2 (L) 8.9 - 10.3 mg/dL   Total Protein 6.7 6.5 - 8.1 g/dL   Albumin 3.1 (L) 3.5 - 5.0 g/dL   AST 15 15 - 41 U/L   ALT 16 0 - 44 U/L   Alkaline Phosphatase 43 38 - 126 U/L   Total Bilirubin 0.2 (L) 0.3 - 1.2 mg/dL   GFR, Estimated >60 >60 mL/min   Anion gap 9 5 - 15  Magnesium   Collection Time: 02/07/20 11:44 AM  Result Value Ref Range   Magnesium 1.9 1.7 - 2.4 mg/dL  Type and screen [facility]   Collection Time: 02/07/20  5:14 PM  Result Value Ref Range   ABO/RH(D) O POS    Antibody Screen NEG    Sample Expiration 02/10/2020,2359    Unit Number [id number]    Blood Component Type RED CELLS,LR    Unit division 00    Status of Unit ISSUED,FINAL    Transfusion Status OK TO TRANSFUSE    Crossmatch Result      Compatible Performed at [facility], [address] [place], [place] [address]   [facility]   Collection Time: 02/07/20  5:14 PM  Result Value  Ref Range   ISSUE DATE / TIME [id number]    Blood Product Unit Number [id number]    PRODUCT CODE [id number]    Unit Type and Rh 5100    Blood Product Expiration Date [id number]   Prepare RBC (crossmatch)   Collection Time: 02/07/20  5:15 PM  Result Value Ref Range   Order Confirmation      ORDER PROCESSED BY BLOOD BANK Performed at [facility], [address] [place], [place] [address]   Results for orders placed or performed during the hospital encounter of 02/05/20 (from the past 336 hour(s))  Resp Panel by RT-PCR (Flu A&B, Covid) Nasopharyngeal Swab   Collection Time: 02/05/20 12:42 PM   Specimen: Nasopharyngeal Swab; Nasopharyngeal(NP) swabs in vial transport medium  Result Value Ref Range   SARS Coronavirus 2 by RT PCR NEGATIVE NEGATIVE   Influenza A by PCR NEGATIVE NEGATIVE   Influenza B by PCR NEGATIVE NEGATIVE  Urinalysis, Routine w reflex microscopic Urine, Clean Catch   Collection Time: 02/05/20  2:19 PM  Result Value Ref Range   Color, Urine YELLOW YELLOW   APPearance CLEAR CLEAR   Specific Gravity, Urine 1.016 1.005 - 1.030   pH 6.0 5.0 - 8.0   Glucose, UA NEGATIVE NEGATIVE mg/dL   Hgb urine dipstick MODERATE (A) NEGATIVE   Bilirubin Urine NEGATIVE NEGATIVE   Ketones, ur NEGATIVE NEGATIVE mg/dL   Protein, ur NEGATIVE NEGATIVE mg/dL   Nitrite NEGATIVE NEGATIVE   Leukocytes,Ua TRACE (A) NEGATIVE   RBC / HPF 0-5 0 - 5 RBC/hpf   WBC, UA 0-5 0 - 5 WBC/hpf   Bacteria, UA NONE SEEN NONE SEEN   Squamous Epithelial / LPF 0-5 0 - 5   Mucus  PRESENT     EKG:    Orders placed or performed in visit on 12/23/14  . EKG 12-Lead  . EKG 12-Lead    Imaging Studies: US Pelvis Complete  Result Date: 02/07/2020 CLINICAL DATA:  Vaginal bleeding.  Pelvic pain. EXAM: TRANSABDOMINAL ULTRASOUND OF PELVIS TECHNIQUE: Transabdominal ultrasound examination of the pelvis was performed including evaluation of the uterus, ovaries,  adnexal regions, and pelvic cul-de-sac. COMPARISON:  None. FINDINGS: Uterus Measurements: 16 x 7 x 9 cm = volume: 530 mL. Two discrete fibroids, largest in the left anterior mid uterine segment, 4.3 x 3.2 x 3.3 cm, second in the right posterior mid uterine segment, 2.1 x 1.8 x 2.2 cm. Endometrium Thickness: 11.  No focal abnormality visualized. Right ovary Measurements: 2.8 x 1.8 x 2.8 cm = volume: 5.9 mL. Normal appearance/no adnexal mass. Left ovary Measurements: 4.8 x 4.7 x 5.0 cm = volume: 56.5 mL. 3.3 cm cyst with no significant complicating features. Color Doppler blood flow is seen surrounding the cyst within the ovarian tissue. Other findings:  No abnormal free fluid. IMPRESSION: 1. No acute findings. 2. Enlarged uterus with 2 visualized fibroids. 3. 3.3 cm left ovarian cyst. This has benign characteristics and is a common finding in premenopausal females. No imaging follow up is required. This follows consensus guidelines: Simple Adnexal Cysts: SRU Consensus Conference Update on Follow-up and Reporting. Radiology 2019; [id number]. Electronically Signed   By: Lajean Manes M.D.   On: 02/07/2020 12:47   DG Knee Complete 4 Views Left  Result Date: 01/26/2020 CLINICAL DATA:  35 year old female with left knee pain. No known injury. EXAM: LEFT KNEE - COMPLETE 4+ VIEW COMPARISON:  None. FINDINGS: There is no acute fracture or dislocation. No significant arthritic changes. The bones are well mineralized. There is a small suprapatellar effusion. The soft tissues are unremarkable. IMPRESSION: 1. No acute fracture or dislocation. 2. Small suprapatellar effusion. Electronically Signed   By: Anner Crete M.D.   On: 01/26/2020 16:02      Assessment: Menometrorrhagia Anemia due to menometrorrhagia requiring blood transfusion Fibroids contributing to menometrorrhagia, uterus 530 cc, 16 weeks size  Plan: Supracervical hysterectomy thru mini lap tentative 03/24/20, plan on same day  discharge  Florian Buff, MD 04/21/2020 10:34 AM

## 2020-04-21 NOTE — Discharge Instructions (Signed)
Post Operative Pain Med Plan:  >Take gabapentin 300 mg three times per day, as prescribed for 4 days, try to space them evenly  >Take the oxycodone 1 tablet, on a schedule, around the clock, every 6 hours(set your phone alarm) for the first 2 days, there may     Be times when you will need 2 tablets but taking them on a schedule will decrease this need  >Then take the oxycodne on an as needed basis per the prescription instructions  >Take the ketorolac or toradol every 8 hours for the first 3 days then the remainder to supplement the oxycodone  >After the toradol is gone then use the ibuprofen as ordered as needed to help supplement the oxycodone  >Use a heating pad as well as needed  >Of course the zofran is available to take as prescribed when needed for nausea  >Be gentle with your diet the first few days, liquids and soft non spicy food, fruits are great  >Get up and move, no lifting or straining  Dr Elonda Husky

## 2020-04-21 NOTE — Op Note (Signed)
Preoperative diagnosis: 16-week size fibroid uterus, 530 cc                                         Menometrorrhagia                                         Anemia requiring transfusion and IV iron due to menometrorrhagia  Postoperative diagnosis: Same as above  Procedure: Supracervical abdominal hysterectomy  Surgeon:  Florian Buff, MD  Anesthesia: General endotracheal  Findings: Preoperatively the patient had an ultrasound which revealed a 530 cc fibroid uterus with multiple fibroids present Her bleeding had required blood transfusion and IV iron in the past We have maintained her recently and high-dose oral megestrol successfully Intraoperatively the preoperative findings were confirmed There were no other intraperitoneal abnormalities There was a small dot of endometriosis on the right ovary Otherwise the ovaries were normal  Description of operation: The patient was taken to the operating room and placed in the supine position where she underwent general endotracheal anesthesia She was prepped and draped for an abdominal hysterectomy Vagina was also prepped Foley catheter was placed A 8 cm transverse incision was made 4 cm above the pubisThe incision was taken down through the subcutaneous tissue to the rectus fascia The fascial incisions were extended laterally manually The fascia was taken off the muscles superiorly sharply and bluntly The rectus muscles were divided and the peritoneal cavity was entered sharply The abdominal wall was stretched manually A small Alexis retractor was placed The right uterine cornu was clamped with a Coker clamp The right round ligament was isolated and suture ligated and cut The utero-ovarian ligament on the right was clamped cut and double suture ligated The bladder flap on the right was created sharply and gently manually The adventitia of the broad ligament was dissected gently manually laterally Attention was then turned to the left  side The left uterine cornu was grasped The utero-ovarian ligament was crossclamped cut and double suture-ligated with good hemostasis The bladder was taken off the lower uterine segment and uterine body sharply and bluntly gently Serial pedicles were taken down the left side Pedicle being clamped cut and transfixion suture ligated I proceeded to pedicles below the uterine vessels on the left Again each pedicle was clamped cut and suture-ligated The right uterine vessels were clamped cut and transfixion suture ligated 2 pedicles were taken below the uterine vessels on the right each pedicle being clamped cut and transfixion suture ligated The uterus and proximal cervix were then removed from the more distal cervix sharply with a #10 bladed knife A reverse endocervical conization was performed using electrocautery unit to both destroyed the endometrial glands that may be in the endocervical canal as well as to facilitate closure of the cervical stump The cervical stump was closed with interrupted figure-of-eight sutures A lateral suture was placed medial to the uterine vessel pedicle cooperating this cervix laterally as well as the endocervix This would serve as the equivalent of vaginal angle sutures and are called cervical angle suture the cervical stump The pelvis was irrigated vigorously There was good hemostasis of all pedicles Estimated blood loss for the procedure was 200 cc Arista was used to ensure the cut surfaces of the peritoneum were hemostatic postoperatively  The muscles and  peritoneum were closed with a single suture The fascia was closed 0 Vicryl running 266 mg of Exparel and 60 cc of total volume was injected in the subcutaneous tissue for postoperative pain management Subcutaneous tissue was made hemostatic The skin was closed using 4-0 Vicryl on a Keith needle in a subcuticular fashion Dermabond was placed for additional wound integrity A honeycomb dressing was placed at  the end of the case  Foley catheter was removed at the end of the case  Patient tolerated procedure well she experienced 200 cc of blood loss She received 2 grams of Ancef preoperatively She received 30 mg of Toradol IV preoperatively as well  She is taken to the recovery room in good stable condition all counts were correct x3 Per preoperative plan she is to be discharged from the [place], MD 04/21/2020 12:31 PM

## 2020-04-22 LAB — SURGICAL PATHOLOGY

## 2020-04-23 ENCOUNTER — Encounter (HOSPITAL_COMMUNITY): Payer: Self-pay | Admitting: Obstetrics & Gynecology

## 2020-04-24 LAB — TYPE AND SCREEN
ABO/RH(D): O POS
Antibody Screen: NEGATIVE
Unit division: 0
Unit division: 0

## 2020-04-24 LAB — BPAM RBC
Blood Product Expiration Date: 202203132359
ISSUE DATE / TIME: 202202150847
Unit Type and Rh: 5100

## 2020-05-04 ENCOUNTER — Other Ambulatory Visit: Payer: Self-pay

## 2020-05-04 ENCOUNTER — Encounter: Payer: Self-pay | Admitting: Obstetrics & Gynecology

## 2020-05-04 ENCOUNTER — Ambulatory Visit (INDEPENDENT_AMBULATORY_CARE_PROVIDER_SITE_OTHER): Payer: Medicaid Other | Admitting: Obstetrics & Gynecology

## 2020-05-04 VITALS — BP 145/97 | HR 97 | Ht 68.0 in

## 2020-05-04 DIAGNOSIS — Z90710 Acquired absence of both cervix and uterus: Secondary | ICD-10-CM

## 2020-05-04 NOTE — Progress Notes (Signed)
  HPI: Patient returns for routine postoperative follow-up having undergone supracervical abdominal hysterectomy on 04/21/20.  The patient's immediate postoperative recovery has been unremarkable. Since hospital discharge the patient reports no problems.   Current Outpatient Medications: APPLE CIDER VINEGAR PO, Take 2 each by mouth daily., Disp: , Rfl:  ascorbic acid (VITAMIN C) 500 MG tablet, Take 500 mg by mouth daily., Disp: , Rfl:  ibuprofen (ADVIL) 800 MG tablet, Take 1 tablet (800 mg total) by mouth every 8 (eight) hours as needed., Disp: 30 tablet, Rfl: 0  No current facility-administered medications for this visit.    Blood pressure (!) 145/97, pulse 97, height 5\' 8"  (1.727 m), last menstrual period 01/17/2020.  Physical Exam: Incision clean dry intact Abdomen is soft normal post op  Diagnostic Tests:   Pathology: benign  Impression:   ICD-10-CM   1. S/P Abdominal supracervical hysterectomy 04/21/20  Z90.710       Plan: No orders of the defined types were placed in this encounter.    Follow up: Return in about 4 weeks (around 06/01/2020) for Follow up, with Dr Elonda Husky.    Florian Buff, MD

## 2020-06-01 ENCOUNTER — Ambulatory Visit (INDEPENDENT_AMBULATORY_CARE_PROVIDER_SITE_OTHER): Payer: BC Managed Care – PPO | Admitting: Obstetrics & Gynecology

## 2020-06-01 ENCOUNTER — Other Ambulatory Visit: Payer: Self-pay

## 2020-06-01 ENCOUNTER — Encounter: Payer: Self-pay | Admitting: Obstetrics & Gynecology

## 2020-06-01 VITALS — BP 147/94 | HR 91 | Ht 68.0 in | Wt 283.0 lb

## 2020-06-01 DIAGNOSIS — Z90710 Acquired absence of both cervix and uterus: Secondary | ICD-10-CM

## 2020-06-01 NOTE — Progress Notes (Signed)
  HPI: Patient returns for routine postoperative follow-up having undergone abdominal supracervical hysterectomy on 2/22.  The patient's immediate postoperative recovery has been unremarkable. Since hospital discharge the patient reports no problems.   Current Outpatient Medications: APPLE CIDER VINEGAR PO, Take 2 each by mouth daily., Disp: , Rfl:  ascorbic acid (VITAMIN C) 500 MG tablet, Take 500 mg by mouth daily., Disp: , Rfl:  ibuprofen (ADVIL) 800 MG tablet, Take 1 tablet (800 mg total) by mouth every 8 (eight) hours as needed., Disp: 30 tablet, Rfl: 0  No current facility-administered medications for this visit.    Blood pressure (!) 147/94, pulse 91, height 5\' 8"  (1.727 m), weight 283 lb (128.4 kg), last menstrual period 01/17/2020.  Physical Exam: Incision clean dry intact Abdomen is soft benign Cervix normal Bimanual is normal  Diagnostic Tests:   Pathology: benign  Impression:   ICD-10-CM   1. S/P Abdominal supracervical hysterectomy 04/21/20  Z90.710       Plan:     Follow up: Next Pap due 05/2024   Florian Buff, MD

## 2020-06-17 ENCOUNTER — Telehealth: Payer: Self-pay | Admitting: Obstetrics & Gynecology

## 2020-06-17 NOTE — Telephone Encounter (Signed)
Patient states that since she had a partial hysterectomy on Feb 16th ,she has "no feeling on the lower part of her abdomen".  Informed patient this is not uncommon after having abdominal surgery as the area where the incision was made, may not feel the same as the nerves were cut.  No other complaints.  Pt verbalized understanding.

## 2020-06-17 NOTE — Telephone Encounter (Signed)
Patient wants Dr. Elonda Husky to know that since she's had a partial hysterectomy on feb 16th ,she has no feeling on the lower part of her abdomen. Patient wants to know if this is normal or should she be concerned, per patient. Clinical staff will follow up with patient.

## 2021-03-22 ENCOUNTER — Encounter (HOSPITAL_COMMUNITY): Payer: Self-pay | Admitting: *Deleted

## 2021-03-22 ENCOUNTER — Emergency Department (HOSPITAL_COMMUNITY)
Admission: EM | Admit: 2021-03-22 | Discharge: 2021-03-22 | Disposition: A | Discharge status: Home / Self Care | Payer: BC Managed Care – PPO | Attending: Emergency Medicine | Admitting: Emergency Medicine

## 2021-03-22 DIAGNOSIS — Z20822 Contact with and (suspected) exposure to covid-19: Secondary | ICD-10-CM | POA: Diagnosis not present

## 2021-03-22 DIAGNOSIS — H6692 Otitis media, unspecified, left ear: Secondary | ICD-10-CM | POA: Insufficient documentation

## 2021-03-22 DIAGNOSIS — H1032 Unspecified acute conjunctivitis, left eye: Secondary | ICD-10-CM | POA: Insufficient documentation

## 2021-03-22 DIAGNOSIS — J029 Acute pharyngitis, unspecified: Secondary | ICD-10-CM | POA: Insufficient documentation

## 2021-03-22 DIAGNOSIS — H9202 Otalgia, left ear: Secondary | ICD-10-CM | POA: Diagnosis present

## 2021-03-22 LAB — RESP PANEL BY RT-PCR (FLU A&B, COVID) ARPGX2
Influenza A by PCR: NEGATIVE
Influenza B by PCR: NEGATIVE
SARS Coronavirus 2 by RT PCR: NEGATIVE

## 2021-03-22 LAB — GROUP A STREP BY PCR: Group A Strep by PCR: NOT DETECTED

## 2021-03-22 MED ORDER — TOBRAMYCIN 0.3 % OP SOLN: 1.0000 [drp] | OPHTHALMIC | 0 refills | Status: AC

## 2021-03-22 MED ORDER — AMOXICILLIN 500 MG PO CAPS: 500.0000 mg | ORAL_CAPSULE | Freq: Three times a day (TID) | ORAL | 0 refills | Status: DC

## 2021-03-22 NOTE — Discharge Instructions (Addendum)
Return if any problems. See your Physicain for recheck of ear infectionin 1 week

## 2021-03-22 NOTE — ED Triage Notes (Signed)
Sore throat, left ear pain and left eye draining

## 2021-03-22 NOTE — ED Provider Notes (Signed)
[facility] Provider Note   CSN: [id number] Arrival date & time: 03/22/21  1414     History  Chief Complaint  Patient presents with   Sore Throat   Otalgia    Christina Randall is a 36 y.o. female.  Pt complains of a sore throat, left eye redness and left eye pain.    The history is provided by the patient. No language interpreter was used.  Sore Throat This is a new problem. The current episode started more than 2 days ago. The problem occurs constantly. The problem has been gradually worsening. Pertinent negatives include no chest pain.  Otalgia     Home Medications Prior to Admission medications   Medication Sig Start Date End Date Taking? Authorizing Provider  amoxicillin (AMOXIL) 500 MG capsule Take 1 capsule (500 mg total) by mouth 3 (three) times daily. 03/22/21  Yes Caryl Ada K, PA-C  tobramycin (TOBREX) 0.3 % ophthalmic solution Place 1 drop into the left eye every 4 (four) hours for 10 days. 03/22/21 04/01/21 Yes Fransico Meadow, PA-C  APPLE CIDER VINEGAR PO Take 2 each by mouth daily.    [provider]  ascorbic acid (VITAMIN C) 500 MG tablet Take 500 mg by mouth daily.    [provider]  ibuprofen (ADVIL) 800 MG tablet Take 1 tablet (800 mg total) by mouth every 8 (eight) hours as needed. 04/21/20   Florian Buff, MD  albuterol (PROVENTIL HFA;VENTOLIN HFA) 108 (90 Base) MCG/ACT inhaler Inhale 1-2 puffs every 6 (six) hours as needed into the lungs for wheezing or shortness of breath. 01/16/17 03/10/19  Triplett, Tammy, PA-C  dicyclomine (BENTYL) 20 MG tablet Take 1 tablet (20 mg total) by mouth 2 (two) times daily. 01/22/19 03/10/19  Frederica Kuster, PA-C      Allergies    Patient has no known allergies.    Review of Systems   Review of Systems  HENT:  Positive for ear pain.   Cardiovascular:  Negative for chest pain.  All other systems reviewed and are negative.  Physical Exam Updated Vital Signs BP (!) 152/99 (BP  Location: Right Arm)    Pulse 77    Temp 98.5 F (36.9 C) (Oral)    Resp 19    LMP 01/17/2020    SpO2 94%  Physical Exam Vitals and nursing note reviewed.  Constitutional:      Appearance: She is well-developed.  HENT:     Head: Normocephalic.     Right Ear: Tympanic membrane normal.     Left Ear: Swelling present. A middle ear effusion is present. Tympanic membrane is erythematous.     Mouth/Throat:     Pharynx: Posterior oropharyngeal erythema present.  Eyes:     Pupils: Pupils are equal, round, and reactive to light.     Comments: Injected left conjunctiva   Cardiovascular:     Rate and Rhythm: Normal rate.     Heart sounds: Normal heart sounds.  Pulmonary:     Effort: Pulmonary effort is normal.  Abdominal:     General: There is no distension.  Musculoskeletal:        General: Normal range of motion.     Cervical back: Normal range of motion.  Skin:    General: Skin is warm.  Neurological:     Mental Status: She is alert and oriented to person, place, and time.  Psychiatric:        Mood and Affect: Mood normal.  ED Results / Procedures / Treatments   Labs (all labs ordered are listed, but only abnormal results are displayed) Labs Reviewed  RESP PANEL BY RT-PCR (FLU A&B, COVID) [id number]  GROUP A STREP BY PCR    EKG None  Radiology No results found.  Procedures Procedures    Medications Ordered in ED Medications - No data to display  ED Course/ Medical Decision Making/ A&P                           Medical Decision Making Amount and/or Complexity of Data Reviewed Labs: ordered. Decision-making details documented in ED Course.    Details: strep covid and influenza are negaitve  Risk Prescription drug management.           Final Clinical Impression(s) / ED Diagnoses Final diagnoses:  Left otitis media, unspecified otitis media type  Acute conjunctivitis of left eye, unspecified acute conjunctivitis type    Rx / DC Orders ED Discharge  Orders          Ordered    amoxicillin (AMOXIL) 500 MG capsule  3 times daily        03/22/21 1708    tobramycin (TOBREX) 0.3 % ophthalmic solution  Every 4 hours        03/22/21 1708           An After Visit Summary was printed and given to the patient.    Fransico Meadow, Hershal Coria 03/22/21 2305    Daleen Bo, MD 03/23/21 [phone]

## 2021-09-17 ENCOUNTER — Emergency Department (HOSPITAL_COMMUNITY)
Admission: EM | Admit: 2021-09-17 | Discharge: 2021-09-17 | Disposition: A | Discharge status: Home / Self Care | Payer: BC Managed Care – PPO | Attending: Emergency Medicine | Admitting: Emergency Medicine

## 2021-09-17 ENCOUNTER — Other Ambulatory Visit: Payer: Self-pay

## 2021-09-17 ENCOUNTER — Emergency Department (HOSPITAL_COMMUNITY): Payer: BC Managed Care – PPO

## 2021-09-17 ENCOUNTER — Encounter (HOSPITAL_COMMUNITY): Payer: Self-pay | Admitting: Emergency Medicine

## 2021-09-17 DIAGNOSIS — I10 Essential (primary) hypertension: Secondary | ICD-10-CM | POA: Diagnosis not present

## 2021-09-17 DIAGNOSIS — G43009 Migraine without aura, not intractable, without status migrainosus: Secondary | ICD-10-CM | POA: Diagnosis not present

## 2021-09-17 DIAGNOSIS — J45909 Unspecified asthma, uncomplicated: Secondary | ICD-10-CM | POA: Diagnosis not present

## 2021-09-17 DIAGNOSIS — R519 Headache, unspecified: Secondary | ICD-10-CM | POA: Diagnosis present

## 2021-09-17 LAB — CBC
HCT: 35.9 % — ABNORMAL LOW (ref 36.0–46.0)
Hemoglobin: 11.4 g/dL — ABNORMAL LOW (ref 12.0–15.0)
MCH: 25.2 pg — ABNORMAL LOW (ref 26.0–34.0)
MCHC: 31.8 g/dL (ref 30.0–36.0)
MCV: 79.4 fL — ABNORMAL LOW (ref 80.0–100.0)
Platelets: 369 10*3/uL (ref 150–400)
RBC: 4.52 MIL/uL (ref 3.87–5.11)
RDW: 15.7 % — ABNORMAL HIGH (ref 11.5–15.5)
WBC: 5.2 10*3/uL (ref 4.0–10.5)
nRBC: 0 % (ref 0.0–0.2)

## 2021-09-17 LAB — URINALYSIS, ROUTINE W REFLEX MICROSCOPIC
Bilirubin Urine: NEGATIVE
Glucose, UA: NEGATIVE mg/dL
Hgb urine dipstick: NEGATIVE
Ketones, ur: NEGATIVE mg/dL
Leukocytes,Ua: NEGATIVE
Nitrite: NEGATIVE
Protein, ur: NEGATIVE mg/dL
Specific Gravity, Urine: 1.006 (ref 1.005–1.030)
pH: 6 (ref 5.0–8.0)

## 2021-09-17 LAB — BASIC METABOLIC PANEL
Anion gap: 6 (ref 5–15)
BUN: 11 mg/dL (ref 6–20)
CO2: 26 mmol/L (ref 22–32)
Calcium: 8.6 mg/dL — ABNORMAL LOW (ref 8.9–10.3)
Chloride: 106 mmol/L (ref 98–111)
Creatinine, Ser: 0.74 mg/dL (ref 0.44–1.00)
GFR, Estimated: >60 mL/min (ref >60)
Glucose, Bld: 86 mg/dL (ref 70–99)
Potassium: 3.9 mmol/L (ref 3.5–5.1)
Sodium: 138 mmol/L (ref 135–145)

## 2021-09-17 LAB — I-STAT BETA HCG BLOOD, ED (MC, WL, AP ONLY): I-stat hCG, quantitative: <5 m[IU]/mL (ref <5)

## 2021-09-17 MED ORDER — PROCHLORPERAZINE EDISYLATE 10 MG/2ML IJ SOLN
10.0000 mg | Freq: Once | INTRAMUSCULAR | Status: AC
  Administered 2021-09-17: 10 mg via INTRAVENOUS
  Filled 2021-09-17: qty 2

## 2021-09-17 MED ORDER — KETOROLAC TROMETHAMINE 15 MG/ML IJ SOLN
15.0000 mg | Freq: Once | INTRAMUSCULAR | Status: AC
  Administered 2021-09-17: 15 mg via INTRAVENOUS
  Filled 2021-09-17: qty 1

## 2021-09-17 MED ORDER — SODIUM CHLORIDE 0.9 % IV BOLUS
1000.0000 mL | Freq: Once | INTRAVENOUS | Status: AC
  Administered 2021-09-17: 1000 mL via INTRAVENOUS

## 2021-09-17 MED ORDER — DIPHENHYDRAMINE HCL 50 MG/ML IJ SOLN
25.0000 mg | Freq: Once | INTRAMUSCULAR | Status: AC
  Administered 2021-09-17: 25 mg via INTRAVENOUS
  Filled 2021-09-17: qty 1

## 2021-09-17 MED ORDER — ONDANSETRON HCL 4 MG/2ML IJ SOLN
4.0000 mg | Freq: Once | INTRAMUSCULAR | Status: AC
  Administered 2021-09-17: 4 mg via INTRAVENOUS
  Filled 2021-09-17: qty 2

## 2021-09-17 MED ORDER — ONDANSETRON 4 MG PO TBDP: 4.0000 mg | ORAL_TABLET | Freq: Three times a day (TID) | ORAL | 0 refills | Status: AC | PRN

## 2021-09-17 NOTE — ED Provider Notes (Signed)
[facility] EMERGENCY DEPARTMENT Provider Note   CSN: [id number] Arrival date & time: 09/17/21  1113     History  Chief Complaint  Patient presents with   Migraine    Christina Randall is a 36 y.o. female with history of HTN, anemia, asthma, chronic back pain, and migraines.  Presenting today with complaints of migraines for the last 3 days.  Woke up with the headache, it is worsened since then.  Has tried Tylenol and ibuprofen with no relief.  Worsened with light and loud sounds.  Denies neck stiffness, vision changes, dizziness, prodrome, N/V, shortness of breath, or neurodeficits.  Used to have somewhat frequent migraines a few years ago, but they became less frequent.  Within the last 6 months has noticed a steady and significant increase in migraine frequency, strength, and duration.  Has never had a neurology work-up.  Hx of partial hysterectomy.  The history is provided by the patient and medical records.  Migraine Associated symptoms include headaches.      Home Medications Prior to Admission medications   Medication Sig Start Date End Date Taking? Authorizing Provider  ondansetron (ZOFRAN-ODT) 4 MG disintegrating tablet Take 1 tablet (4 mg total) by mouth every 8 (eight) hours as needed for nausea or vomiting. 09/17/21  Yes Prince Rome, PA-C  amoxicillin (AMOXIL) 500 MG capsule Take 1 capsule (500 mg total) by mouth 3 (three) times daily. 03/22/21   Fransico Meadow, PA-C  APPLE CIDER VINEGAR PO Take 2 each by mouth daily.    [provider]  ascorbic acid (VITAMIN C) 500 MG tablet Take 500 mg by mouth daily.    [provider]  ibuprofen (ADVIL) 800 MG tablet Take 1 tablet (800 mg total) by mouth every 8 (eight) hours as needed. 04/21/20   Florian Buff, MD  albuterol (PROVENTIL HFA;VENTOLIN HFA) 108 (90 Base) MCG/ACT inhaler Inhale 1-2 puffs every 6 (six) hours as needed into the lungs for wheezing or shortness of breath. 01/16/17 03/10/19  Triplett,  Tammy, PA-C  dicyclomine (BENTYL) 20 MG tablet Take 1 tablet (20 mg total) by mouth 2 (two) times daily. 01/22/19 03/10/19  Frederica Kuster, PA-C      Allergies    Patient has no known allergies.    Review of Systems   Review of Systems  Neurological:  Positive for headaches.    Physical Exam Updated Vital Signs BP (!) 146/96 (BP Location: Right Arm)   Pulse 64   Temp 98.6 F (37 C) (Oral)   Resp 20   Ht '5\' 8"'$  (1.727 m)   Wt 128.4 kg   LMP 01/17/2020   SpO2 100%   BMI 43.04 kg/m  Physical Exam Vitals and nursing note reviewed.  Constitutional:      General: She is not in acute distress.    Appearance: She is well-developed. She is not ill-appearing, toxic-appearing or diaphoretic.  HENT:     Head: Normocephalic and atraumatic.     Nose: Nose normal.     Mouth/Throat:     Pharynx: Oropharynx is clear.  Eyes:     General: Lids are normal. Gaze aligned appropriately. No scleral icterus.    Extraocular Movements: Extraocular movements intact.     Conjunctiva/sclera: Conjunctivae normal.     Pupils: Pupils are equal, round, and reactive to light.  Neck:     Comments: Neck very supple on exam Cardiovascular:     Rate and Rhythm: Normal rate and regular rhythm.  Pulses: Normal pulses.     Heart sounds: No murmur heard. Pulmonary:     Effort: Pulmonary effort is normal. No respiratory distress.     Breath sounds: Normal breath sounds.  Chest:     Chest wall: No tenderness.  Abdominal:     Palpations: Abdomen is soft.     Tenderness: There is no abdominal tenderness.  Musculoskeletal:        General: No swelling.     Cervical back: Neck supple. No rigidity or tenderness.     Right lower leg: No edema.     Left lower leg: No edema.  Skin:    General: Skin is warm and dry.     Capillary Refill: Capillary refill takes less than 2 seconds.     Coloration: Skin is not jaundiced or pale.  Neurological:     General: No focal deficit present.     Mental Status: She  is alert and oriented to person, place, and time.     GCS: GCS eye subscore is 4. GCS verbal subscore is 5. GCS motor subscore is 6.     Cranial Nerves: No dysarthria or facial asymmetry.     Sensory: Sensation is intact.     Motor: Motor function is intact. No weakness.     Coordination: Coordination is intact.     Gait: Gait is intact.  Psychiatric:        Mood and Affect: Mood normal.     ED Results / Procedures / Treatments   Labs (all labs ordered are listed, but only abnormal results are displayed) Labs Reviewed  BASIC METABOLIC PANEL - Abnormal; Notable for the following components:      Result Value   Calcium 8.6 (*)    All other components within normal limits  CBC - Abnormal; Notable for the following components:   Hemoglobin 11.4 (*)    HCT 35.9 (*)    MCV 79.4 (*)    MCH 25.2 (*)    RDW 15.7 (*)    All other components within normal limits  URINALYSIS, ROUTINE W REFLEX MICROSCOPIC - Abnormal; Notable for the following components:   Color, Urine STRAW (*)    All other components within normal limits  I-STAT BETA HCG BLOOD, ED ([facility], [facility], [facility] ONLY)    EKG None  Radiology CT Head Wo Contrast  Result Date: 09/17/2021 CLINICAL DATA:  Migraine headache for 3 days, right-sided facial pressure EXAM: CT HEAD WITHOUT CONTRAST TECHNIQUE: Contiguous axial images were obtained from the base of the skull through the vertex without intravenous contrast. RADIATION DOSE REDUCTION: This exam was performed according to the departmental dose-optimization program which includes automated exposure control, adjustment of the mA and/or kV according to patient size and/or use of iterative reconstruction technique. COMPARISON:  03/07/2009 FINDINGS: Brain: No acute infarct or hemorrhage. Lateral ventricles and midline structures are unremarkable. No acute extra-axial fluid collections. No mass effect. Vascular: No hyperdense vessel or unexpected calcification. Skull: Normal. Negative for fracture  or focal lesion. Sinuses/Orbits: Minimal polypoid mucosal thickening within the ethmoid air cells and right sphenoid sinus. Remaining paranasal sinuses are clear. Other: None. IMPRESSION: 1. Minimal paranasal sinus disease as above. 2. No acute intracranial process. Electronically Signed   By: Randa Ngo M.D.   On: 09/17/2021 14:55    Procedures Procedures    Medications Ordered in ED Medications  sodium chloride 0.9 % bolus 1,000 mL (0 mLs Intravenous Stopped 09/17/21 1626)  ondansetron (ZOFRAN) injection 4 mg (4 mg Intravenous  Given 09/17/21 1415)  ketorolac (TORADOL) 15 MG/ML injection 15 mg (15 mg Intravenous Given 09/17/21 1604)  prochlorperazine (COMPAZINE) injection 10 mg (10 mg Intravenous Given 09/17/21 1604)  diphenhydrAMINE (BENADRYL) injection 25 mg (25 mg Intravenous Given 09/17/21 1605)    ED Course/ Medical Decision Making/ A&P                           Medical Decision Making Amount and/or Complexity of Data Reviewed Labs: ordered. Radiology: ordered.  Risk Prescription drug management.   36 y.o. female presents to the ED for concern of Migraine   This involves an extensive number of treatment options, and is a complaint that carries with it a high risk of complications and morbidity.    Past Medical History / Co-morbidities / Social History: Hx of HTN, anemia, asthma, chronic back pain, and migraines Social Determinants of Health include: None  Additional History:  Internal and external records from outside source obtained and reviewed including ED visits, most recent complaint of migraine is 11/26/2014  Lab Tests: I ordered, and personally interpreted labs.  The pertinent results include:   CBC: Mild anemia Hgb 11.4 HCT 35.9, consistent with baseline CMP/BMP: Unremarkable I-STAT beta-hCG: Negative UA: Unremarkable  Imaging Studies: I ordered imaging studies including CT head due to increasing frequency, duration, and strength of migraines, with  presentation upon awakening.   I independently visualized and interpreted imaging which showed negative for acute intracranial process or pathology I agree with the radiologist interpretation.  ED Course: Pt well-appearing on exam.  Afebrile.  Nonseptic, not ill-appearing in NAD.  AAOx4.  Presenting with HA of 3 day duration that was present upon awakening.  Attempted ibuprofen and tylenol without success. Used to have somewhat frequent migraines a few years ago, but they became less frequent.  Within the last 6 months has noticed a steady and significant increase in migraine frequency, strength, and duration.  Has never had a neurology work-up or CT imaging.  Without aura, vision changes, meningismus, temporal tenderness, N/V, focal neurodeficits, or dizziness.  Headache not described as the worst headache of her life.  Therefore, I believe extensive labs may be deferred at this time.  Plan to proceed with CT imaging of the head.     CT imaging negative for acute intracranial pathology.  Low concern for SAH, ICH, meningitis, or temporal arteritis.  Labs unremarkable.  Ordered fluid bolus and migraine cocktail.  Plan to reassess.   Upon reevaluation, significant improvement and resolution of symptoms following migraine cocktail.  Patient states she feels much better and would like to go home to continue resting.  Recommended follow-up with neurology due to increasing frequency and duration and severity of migraines, especially over the last few months.  Recommend rest, hydration, avoidance of triggers, and continued conservative management.  Patient agreeable to this and satisfied with today's encounter.  Patient in NAD and in good condition at time of discharge.  Disposition: After consideration the patient's encounter today, I do not feel today's workup suggests an emergent condition requiring admission or immediate intervention beyond what has been performed at this time.  Safe for discharge; instructed  to return immediately for worsening symptoms, change in symptoms or any other concerns.  I have reviewed the patients home medicines and have made adjustments as needed.  Discussed course of treatment with the patient, whom demonstrated understanding.  Patient in agreement and has no further questions.     This chart was dictated  using voice recognition software.  Despite best efforts to proofread, errors can occur which can change the documentation meaning.         Final Clinical Impression(s) / ED Diagnoses Final diagnoses:  Migraine without aura and without status migrainosus, not intractable    Rx / DC Orders ED Discharge Orders          Ordered    Ambulatory referral to Neurology       Comments: An appointment is requested in approximately: 1 week   09/17/21 1351    ondansetron (ZOFRAN-ODT) 4 MG disintegrating tablet  Every 8 hours PRN        09/17/21 1352              Prince Rome, PA-C 83/66/29 1302    Milton Ferguson, MD 09/22/21 1023

## 2021-09-17 NOTE — Discharge Instructions (Addendum)
You have been provided the contact information for [facility] neurology.  I have placed a referral for you on their behalf.  If they have not contacted you within the next 2-3 business days, please call them and schedule.  A prescription for an antinausea medication by the name of Zofran has also been sent to your pharmacy.  This is a dissolvable tablet you placed under your tongue for nausea relief.  Take 1 tablet every 6 hours for the first 24 hours, then as needed every 6-8 hours after that.  Also recommend following up with your PCP within the next 2 to 3 days for reevaluation continue medical management.  Further information regarding migraines and overall prevention have been provided for you to review.  Return to the ED for new or worsening symptoms as discussed.

## 2021-09-17 NOTE — ED Notes (Signed)
Pt teaching provided on medications that may cause drowsiness. Pt instructed not to drive or operate heavy machinery while taking the prescribed medication. Pt verbalized understanding.   Pt provided discharge instructions and prescription information. Pt was given the opportunity to ask questions and questions were answered.   

## 2021-09-17 NOTE — ED Notes (Signed)
Pt reports migraine x 3 days with increased pressure on the right side of face. Denies drainage or congestion. Hx of migraines with unknown cause per pt. Neuro WDL. PERRLA

## 2021-09-17 NOTE — ED Triage Notes (Signed)
Pt to the ED with complaints of a migraine for the past 3 days. Pt has used OTC medications with no relief.

## 2021-09-19 ENCOUNTER — Encounter: Payer: Self-pay | Admitting: Neurology

## 2022-02-20 NOTE — Progress Notes (Unsigned)
NEUROLOGY CONSULTATION NOTE  Christina Randall MRN: [id number] DOB: Feb 11, 1986  Referring provider: Dorise Bullion, PA-C (ED referral) Primary care provider: Lemmie Evens, MD  Reason for consult:  migraine  Assessment/Plan:   Migraine without aura, without status migrainosus, intractable  Migraine prevention:  start topiramate '25mg'$  at bedtime.   Migraine rescue:  stop ibuprofen.  Start rizatriptan '10mg'$  Limit use of pain relievers to no more than 2 days out of week to prevent risk of rebound or medication-overuse headache. Keep headache diary Follow up 4 to 5 months    Subjective:  Christina Randall is a 36 year old female with HTN, anemia, asthma, scoliosis with chronic back pain who presents for migraines.  History supplemented by ED note.  Onset:  36 years old.   Location:  usually unilateral either side Quality:  sometimes pressure, stabbing Intensity:  severe.   Aura:  absent Prodrome:  absent Associated symptoms:  nausea, vomiting, photophobia, osmophobia.  She denies associated phonophobia, visual disturbance, unilateral numbness or weakness. Duration:  1 to 3 days (has been as long as a week) Frequency:  2-3 times a week (on average total 3 days a week) Frequency of abortive medication: ibuprofen 2 to 3 days a week Triggers:  sometimes bright light Relieving factors:  cool migraine mask, exposure to a fan, pulling hair Activity:  7-8 days a week difficult to function  Seen in ED on 09/17/2021 for migraine.  CT head personally reviewed was unremarkable.  Treated with migraine cocktail.    Past NSAIDS/analgesics:  diclofenac, ketorolac, acetaminophen, Excedrin Past abortive triptans:  none Past abortive ergotamine:  none Past muscle relaxants:  Robaxin Past anti-emetic:  Phenergan Past antihypertensive medications:  none Past antidepressant medications:  none Past anticonvulsant medications:  gabapentin Past anti-CGRP:  none Past  vitamins/Herbal/Supplements:  none Past antihistamines/decongestants:  Zyrtec Other past therapies:  none  Current NSAIDS/analgesics:  ibuprofen Current triptans:  none Current ergotamine:  none Current anti-emetic:  Zofran '4mg'$  Current muscle relaxants:  none Current Antihypertensive medications:  losartan Current Antidepressant medications:  none Current Anticonvulsant medications:  none Current anti-CGRP:  none Other therapy:  none Birth control medication:  none Other medications:    Caffeine:  Rarely Diet:  at least 7 bottles of water daily.  Sometimes skips breakfast.  Rarely soda Exercise:  no Depression:  mild depression; Anxiety:  yes Other pain:  back pain Sleep hygiene:  falls asleep quickly.  Wakes up 3 AM but may take a little longer to fall back to sleep.  Average 6 hours of sleep a night Family history of headache:  no      PAST MEDICAL HISTORY: Past Medical History:  Diagnosis Date   Anemia    Asthma    Back pain    Frequent headaches    Herniated disc    Herpes    Hypertension    Scoliosis     PAST SURGICAL HISTORY: Past Surgical History:  Procedure Laterality Date   ANAL FISSURE REPAIR     LAPAROSCOPIC BILATERAL SALPINGECTOMY Bilateral 12/30/2014   Procedure: LAPAROSCOPIC BILATERAL SALPINGECTOMY;  Surgeon: Florian Buff, MD;  Location: [facility];  Service: Gynecology;  Laterality: Bilateral;   SUPRACERVICAL ABDOMINAL HYSTERECTOMY N/A 04/21/2020   Procedure: SUPRACERVICAL ABDOMINAL HYSTERECTOMY;  Surgeon: Florian Buff, MD;  Location: [facility];  Service: Gynecology;  Laterality: N/A;    MEDICATIONS: Current Outpatient Medications on File Prior to Visit  Medication Sig Dispense Refill   amoxicillin (AMOXIL) 500 MG capsule Take 1 capsule (  500 mg total) by mouth 3 (three) times daily. 30 capsule 0   APPLE CIDER VINEGAR PO Take 2 each by mouth daily.     ascorbic acid (VITAMIN C) 500 MG tablet Take 500 mg by mouth daily.     ibuprofen (ADVIL) 800 MG  tablet Take 1 tablet (800 mg total) by mouth every 8 (eight) hours as needed. 30 tablet 0   ondansetron (ZOFRAN-ODT) 4 MG disintegrating tablet Take 1 tablet (4 mg total) by mouth every 8 (eight) hours as needed for nausea or vomiting. 20 tablet 0   [DISCONTINUED] albuterol (PROVENTIL HFA;VENTOLIN HFA) 108 (90 Base) MCG/ACT inhaler Inhale 1-2 puffs every 6 (six) hours as needed into the lungs for wheezing or shortness of breath. 1 Inhaler 0   [DISCONTINUED] dicyclomine (BENTYL) 20 MG tablet Take 1 tablet (20 mg total) by mouth 2 (two) times daily. 20 tablet 0   No current facility-administered medications on file prior to visit.    ALLERGIES: No Known Allergies  FAMILY HISTORY: Family History  Problem Relation Age of Onset   Hypertension Father    Diabetes Maternal Grandmother    Cancer Maternal Grandmother        pancreatic   Cancer Maternal Grandfather        lungs    Objective:  Blood pressure (!) 140/90, pulse 64, height '5\' 7"'$  (1.702 m), weight (!) 316 lb 9.6 oz (143.6 kg), last menstrual period 01/17/2020, SpO2 100 %. General: No acute distress.  Patient appears well-groomed.   Head:  Normocephalic/atraumatic Eyes:  fundi examined but not visualized Neck: supple, no paraspinal tenderness, full range of motion Back: No paraspinal tenderness Heart: regular rate and rhythm Lungs: Clear to auscultation bilaterally. Vascular: No carotid bruits. Neurological Exam: Mental status: alert and oriented to person, place, and time, speech fluent and not dysarthric, language intact. Cranial nerves: CN I: not tested CN II: pupils equal, round and reactive to light, visual fields intact CN III, IV, VI:  full range of motion, no nystagmus, no ptosis CN V: facial sensation intact. CN VII: upper and lower face symmetric CN VIII: hearing intact CN IX, X: gag intact, uvula midline CN XI: sternocleidomastoid and trapezius muscles intact CN XII: tongue midline Bulk & Tone: normal, no  fasciculations. Motor:  muscle strength 5/5 throughout Sensation:  Pinprick, temperature and vibratory sensation intact. Deep Tendon Reflexes:  2+ throughout,  toes downgoing.   Finger to nose testing:  Without dysmetria.   Heel to shin:  Without dysmetria.   Gait:  Normal station and stride.  Romberg negative.    Thank you for allowing me to take part in the care of this patient.  Metta Clines, DO  CC: Lemmie Evens, MD

## 2022-02-21 ENCOUNTER — Ambulatory Visit: Payer: BC Managed Care – PPO | Admitting: Neurology

## 2022-02-21 ENCOUNTER — Encounter: Payer: Self-pay | Admitting: Neurology

## 2022-02-21 VITALS — BP 140/90 | HR 64 | Ht 67.0 in | Wt 316.6 lb

## 2022-02-21 DIAGNOSIS — G43119 Migraine with aura, intractable, without status migrainosus: Secondary | ICD-10-CM

## 2022-02-21 MED ORDER — RIZATRIPTAN BENZOATE 10 MG PO TABS: 10.0000 mg | ORAL_TABLET | ORAL | 5 refills | Status: AC | PRN

## 2022-02-21 MED ORDER — TOPIRAMATE 25 MG PO TABS: 25.0000 mg | ORAL_TABLET | Freq: Every day | ORAL | 5 refills | Status: AC

## 2022-02-21 NOTE — Patient Instructions (Addendum)
  Start topiramate '25mg'$  at bedtime.  Contact us in 4 weeks with update and we can increase dose if needed. Stop ibuprofen.  Take rizatriptan '10mg'$  at earliest onset of headache.  May repeat dose once in 2 hours if needed.  Maximum 2 tablets in 24 hours. Limit use of pain relievers to no more than 2 days out of the week.  These medications include acetaminophen, NSAIDs (ibuprofen/Advil/Motrin, naproxen/Aleve, triptans (Imitrex/sumatriptan), Excedrin, and narcotics.  This will help reduce risk of rebound headaches. Be aware of common food triggers:  - Caffeine:  coffee, black tea, cola, Mt. Dew  - Chocolate  - Dairy:  aged cheeses (brie, blue, cheddar, gouda, Leach, provolone, [place], Swiss, etc), chocolate milk, buttermilk, sour cream, limit eggs and yogurt  - Nuts, peanut butter  - Alcohol  - Cereals/grains:  FRESH breads (fresh bagels, sourdough, doughnuts), yeast productions  - Processed/canned/aged/cured meats (pre-packaged deli meats, hotdogs)  - MSG/glutamate:  soy sauce, flavor enhancer, pickled/preserved/marinated foods  - Sweeteners:  aspartame (Equal, Nutrasweet).  Sugar and Splenda are okay  - Vegetables:  legumes (lima beans, lentils, snow peas, fava beans, pinto peans, peas, garbanzo beans), sauerkraut, onions, olives, pickles  - Fruit:  avocados, bananas, citrus fruit (orange, lemon, grapefruit), mango  - Other:  Frozen meals, macaroni and cheese Routine exercise Stay adequately hydrated (aim for 64 oz water daily) Keep headache diary Maintain proper stress management Maintain proper sleep hygiene Do not skip meals Consider supplements:  magnesium citrate '400mg'$  daily, riboflavin '400mg'$  daily, coenzyme Q10 '100mg'$  three times daily. Follow up 4-5 months.

## 2022-05-04 ENCOUNTER — Encounter: Payer: Self-pay | Admitting: Radiology

## 2022-07-11 NOTE — Progress Notes (Deleted)
NEUROLOGY FOLLOW UP OFFICE NOTE  ALAYIAH BARTELL [id number]  Assessment/Plan:   Migraine without aura, without status migrainosus, intractable   Migraine prevention:  *** topiramate 25mg  at bedtime.  *** Migraine rescue:  rizatriptan 10mg  *** Limit use of pain relievers to no more than 2 days out of week to prevent risk of rebound or medication-overuse headache. Keep headache diary Follow up 4 to 5 months       Subjective:  Shaquisha A Steidley is a 37 year old female with HTN, anemia, asthma, scoliosis with chronic back pain who follows up for migraines.  UPDATE: Started topiramate. Intensity:  *** Duration:  *** with rizatriptan Frequency:  *** Frequency of abortive medication: *** Current NSAIDS/analgesics: none Current triptans:  rizatriptan 10mg  Current ergotamine:  none Current anti-emetic:  Zofran 4mg  Current muscle relaxants:  none Current Antihypertensive medications:  losartan Current Antidepressant medications:  none Current Anticonvulsant medications:  topiramate 25mg  QHS Current anti-CGRP:  none Other therapy:  none Birth control medication:  none Other medications:     Caffeine:  Rarely Diet:  at least 7 bottles of water daily.  Sometimes skips breakfast.  Rarely soda Exercise:  no Depression:  mild depression; Anxiety:  yes Other pain:  back pain Sleep hygiene:  falls asleep quickly.  Wakes up 3 AM but may take a little longer to fall back to sleep.  Average 6 hours of sleep a night   HISTORY:  Onset:  37 years old.   Location:  usually unilateral either side Quality:  sometimes pressure, stabbing Intensity:  severe.   Aura:  absent Prodrome:  absent Associated symptoms:  nausea, vomiting, photophobia, osmophobia.  She denies associated phonophobia, visual disturbance, unilateral numbness or weakness. Duration:  1 to 3 days (has been as long as a week) Frequency:  2-3 times a week (on average total 3 days a week) Frequency of abortive  medication: ibuprofen 2 to 3 days a week Triggers:  sometimes bright light Relieving factors:  cool migraine mask, exposure to a fan, pulling hair Activity:  7-8 days a week difficult to function   Seen in ED on 09/17/2021 for migraine.  CT head personally reviewed was unremarkable.  Treated with migraine cocktail.     Past NSAIDS/analgesics:  ibuprofen, diclofenac, ketorolac, acetaminophen, Excedrin Past abortive triptans:  none Past abortive ergotamine:  none Past muscle relaxants:  Robaxin Past anti-emetic:  Phenergan Past antihypertensive medications:  none Past antidepressant medications:  none Past anticonvulsant medications:  gabapentin Past anti-CGRP:  none Past vitamins/Herbal/Supplements:  none Past antihistamines/decongestants:  Zyrtec Other past therapies:  none    Family history of headache:  no  PAST MEDICAL HISTORY: Past Medical History:  Diagnosis Date   Anemia    Asthma    Back pain    Frequent headaches    Herniated disc    Herpes    Hypertension    Scoliosis     MEDICATIONS: Current Outpatient Medications on File Prior to Visit  Medication Sig Dispense Refill   HYDROcodone-acetaminophen (NORCO) 10-325 MG tablet Take 1 tablet by mouth every 4 (four) hours as needed.     ibuprofen (ADVIL) 800 MG tablet Take 1 tablet (800 mg total) by mouth every 8 (eight) hours as needed. 30 tablet 0   losartan (COZAAR) 25 MG tablet Take 25 mg by mouth every morning.     ondansetron (ZOFRAN-ODT) 4 MG disintegrating tablet Take 1 tablet (4 mg total) by mouth every 8 (eight) hours as needed for nausea or vomiting.  20 tablet 0   rizatriptan (MAXALT) 10 MG tablet Take 1 tablet (10 mg total) by mouth as needed for migraine. May repeat in 2 hours if needed.  Maximum 2 tablets in 24 hours. 10 tablet 5   topiramate (TOPAMAX) 25 MG tablet Take 1 tablet (25 mg total) by mouth at bedtime. 30 tablet 5   [DISCONTINUED] albuterol (PROVENTIL HFA;VENTOLIN HFA) 108 (90 Base) MCG/ACT  inhaler Inhale 1-2 puffs every 6 (six) hours as needed into the lungs for wheezing or shortness of breath. 1 Inhaler 0   [DISCONTINUED] dicyclomine (BENTYL) 20 MG tablet Take 1 tablet (20 mg total) by mouth 2 (two) times daily. 20 tablet 0   No current facility-administered medications on file prior to visit.    ALLERGIES: No Known Allergies  FAMILY HISTORY: Family History  Problem Relation Age of Onset   Hypertension Father    Diabetes Maternal Grandmother    Cancer Maternal Grandmother        pancreatic   Cancer Maternal Grandfather        lungs      Objective:  *** General: No acute distress.  Patient appears ***-groomed.   Head:  Normocephalic/atraumatic Eyes:  Fundi examined but not visualized Neck: supple, no paraspinal tenderness, full range of motion Heart:  Regular rate and rhythm Lungs:  Clear to auscultation bilaterally Back: No paraspinal tenderness Neurological Exam: alert and oriented to person, place, and time.  Speech fluent and not dysarthric, language intact.  CN II-XII intact. Bulk and tone normal, muscle strength 5/5 throughout.  Sensation to light touch intact.  Deep tendon reflexes 2+ throughout, toes downgoing.  Finger to nose testing intact.  Gait normal, Romberg negative.   Shon Millet, DO  CC: ***

## 2022-07-12 ENCOUNTER — Encounter: Payer: Self-pay | Admitting: Neurology

## 2022-07-12 ENCOUNTER — Ambulatory Visit: Payer: Self-pay | Admitting: Neurology

## 2022-07-12 DIAGNOSIS — Z029 Encounter for administrative examinations, unspecified: Secondary | ICD-10-CM

## 2022-10-26 IMAGING — US US PELVIS COMPLETE
1 series · 13 of 25 positions shown · non-contrast
Comparison: None.

CLINICAL DATA: Vaginal bleeding.  Pelvic pain.

EXAM:
TRANSABDOMINAL ULTRASOUND OF PELVIS
TECHNIQUE: Transabdominal ultrasound examination of the pelvis was performed
including evaluation of the uterus, ovaries, adnexal regions, and
pelvic cul-de-sac.

[Series 1: us pelvis (transabdominal only) · 13 of 38 slices shown]
[im 1/38]
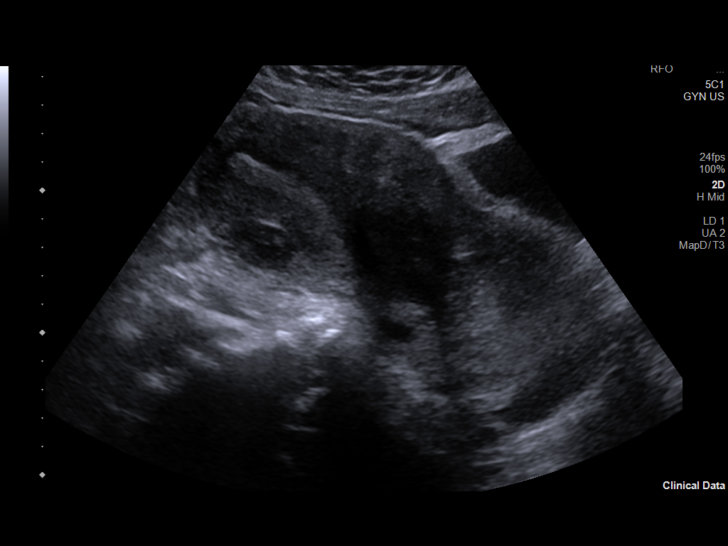
[im 4/38]
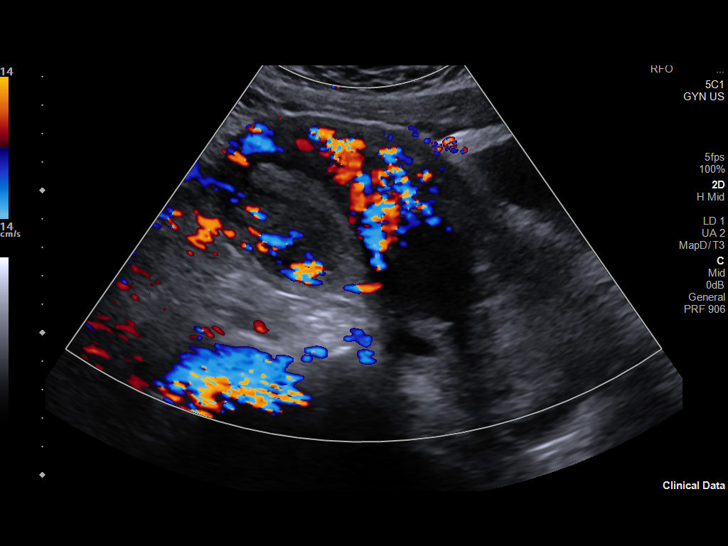
[im 7/38]
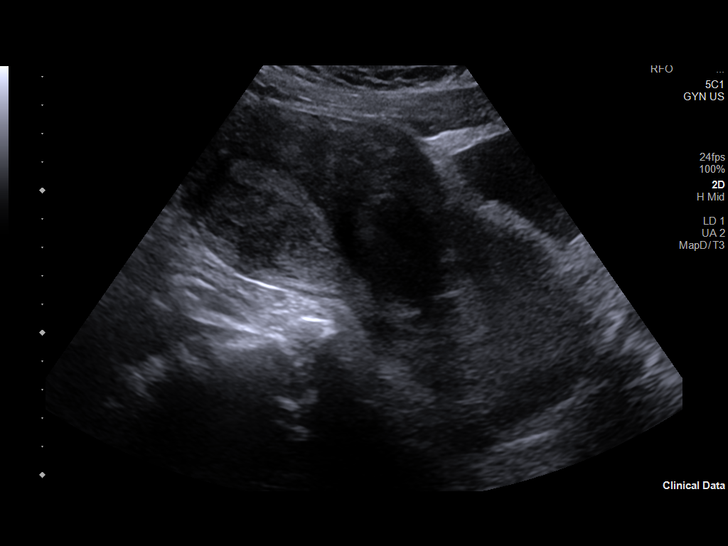
[im 10/38]
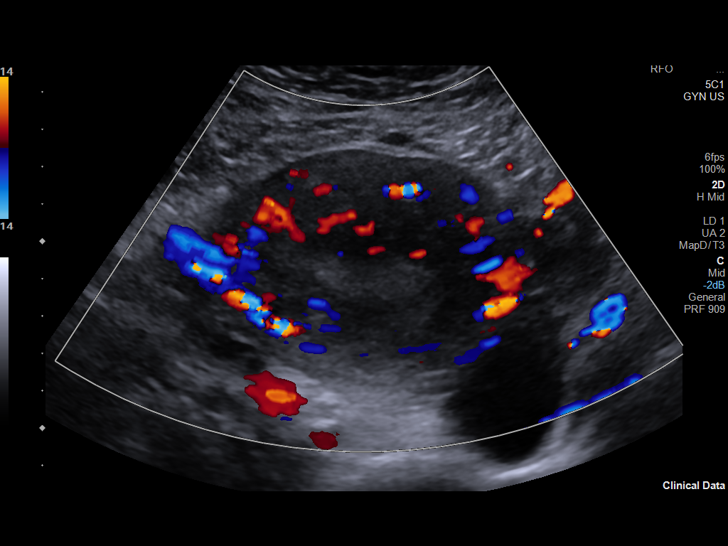
[im 13/38]
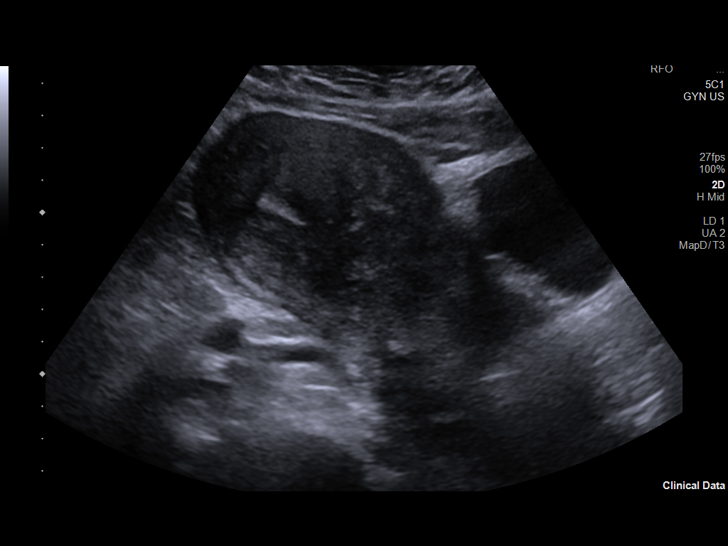
[im 16/38]
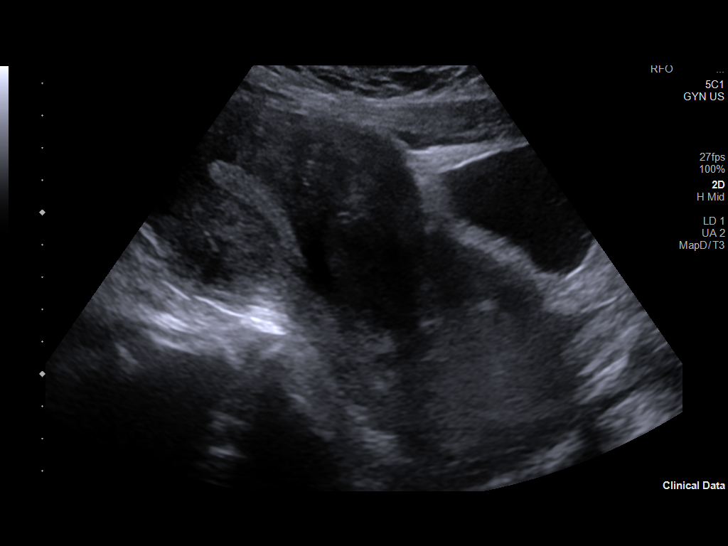
[im 19/38]
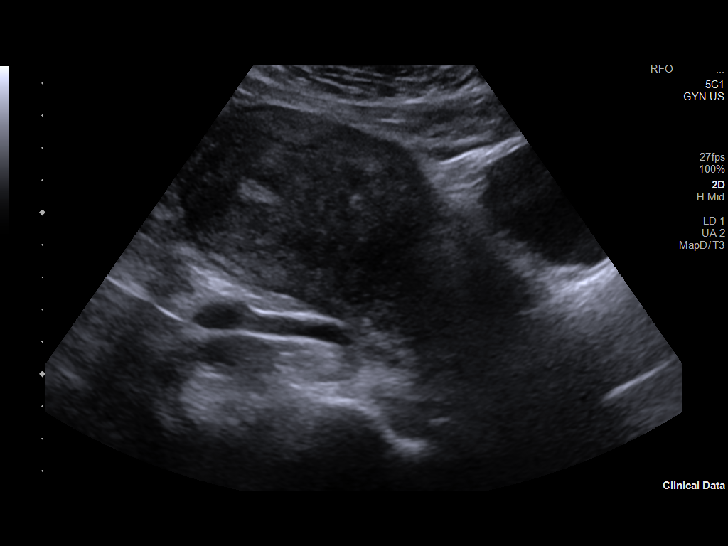
[im 22/38]
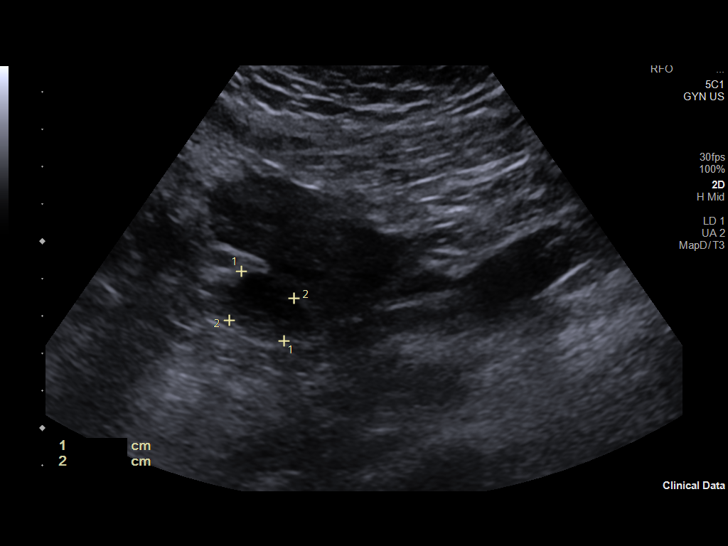
[im 25/38]
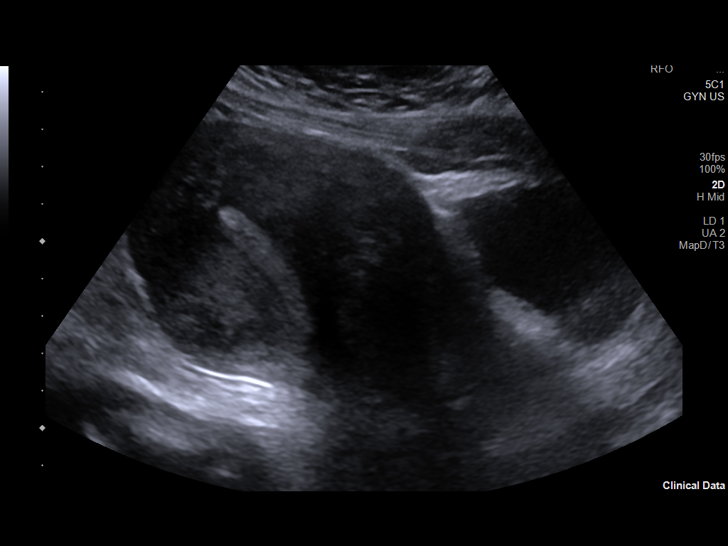
[im 28/38]
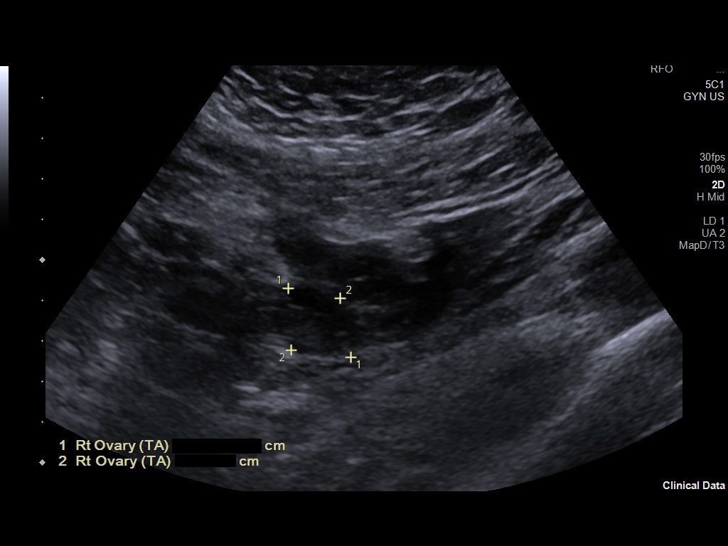
[im 31/38]
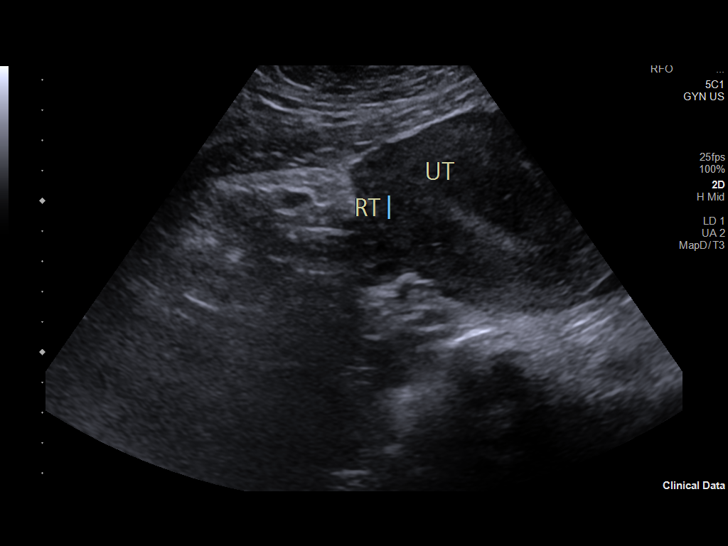
[im 34/38]
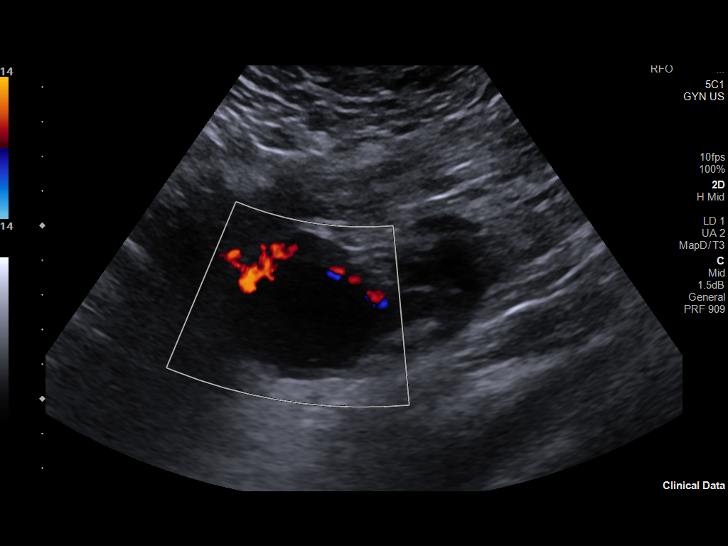
[im 38/38]
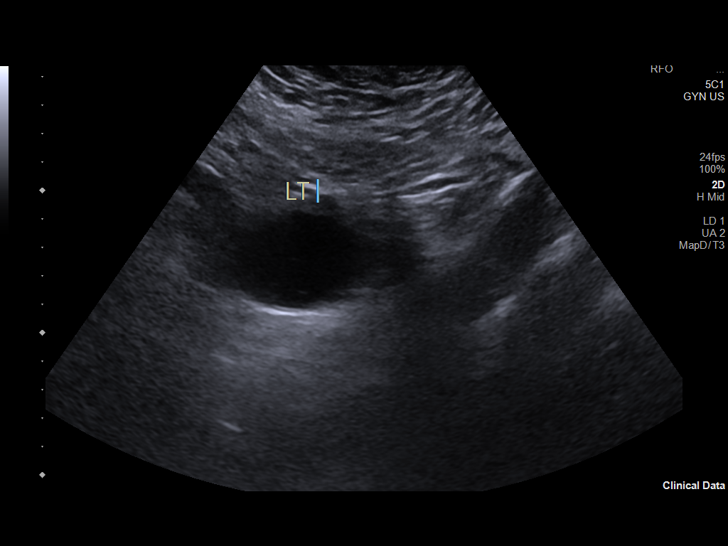

[13 of 25 positions shown; findings below may reference images not displayed]

FINDINGS: Uterus

Measurements: 16 x 7 x 9 cm = volume: 530 mL. Two discrete fibroids,
largest in the left anterior mid uterine segment, 4.3 x 3.2 x
cm, second in the right posterior mid uterine segment, 2.1 x 1.8 x
2.2 cm.

Endometrium

Thickness: 11.  No focal abnormality visualized.

Right ovary

Measurements: 2.8 x 1.8 x 2.8 cm = volume: 5.9 mL. Normal
appearance/no adnexal mass.

Left ovary

Measurements: 4.8 x 4.7 x 5.0 cm = volume: 56.5 mL. 3.3 cm cyst with
no significant complicating features. Color Doppler blood flow is
seen surrounding the cyst within the ovarian tissue.

Other findings:  No abnormal free fluid.
IMPRESSION: 1. No acute findings.
2. Enlarged uterus with 2 visualized fibroids.
3. 3.3 cm left ovarian cyst. This has benign characteristics and is
a common finding in premenopausal females. No imaging follow up is
required. This follows consensus guidelines: Simple Adnexal Cysts:
SRU Consensus Conference Update on Follow-up and Reporting.

## 2023-01-10 ENCOUNTER — Ambulatory Visit
Admission: EM | Admit: 2023-01-10 | Discharge: 2023-01-10 | Disposition: A | Discharge status: Home / Self Care | Payer: 59 | Attending: Family Medicine | Admitting: Family Medicine

## 2023-01-10 DIAGNOSIS — J039 Acute tonsillitis, unspecified: Secondary | ICD-10-CM

## 2023-01-10 LAB — POCT RAPID STREP A (OFFICE): Rapid Strep A Screen: NEGATIVE

## 2023-01-10 MED ORDER — AMOXICILLIN 875 MG PO TABS: 875.0000 mg | ORAL_TABLET | Freq: Two times a day (BID) | ORAL | 0 refills | Status: AC

## 2023-01-10 MED ORDER — LIDOCAINE VISCOUS HCL 2 % MT SOLN: 10.0000 mL | OROMUCOSAL | 0 refills | Status: AC | PRN

## 2023-01-10 NOTE — ED Triage Notes (Signed)
Pt c/o sore throat x2 days 

## 2023-01-10 NOTE — ED Provider Notes (Signed)
[facility]    CSN: [id number] Arrival date & time: 01/10/23  1815      History   Chief Complaint No chief complaint on file.   HPI Christina Randall is a 37 y.o. female.   Presenting today with 2-day history of progressively worsening sore, swollen feeling throat.  Denies fever, chills, cough, congestion, chest pain, shortness of breath, abdominal pain, nausea vomiting or diarrhea.  So far trying Mucinex with no relief.  No known sick contacts recently.    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   Uterine fibroid during pregnancy, antepartum 03/31/2014   Opiate dependence (HCC) 03/31/2014   H N P-LUMBAR 01/05/2009   DEGENERATIVE JOINT DISEASE, KNEE 11/26/2008   DERANGEMENT MENISCUS 11/26/2008   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   Backache 11/26/2008    Past Surgical History:  Procedure Laterality Date   ANAL FISSURE REPAIR     LAPAROSCOPIC BILATERAL SALPINGECTOMY Bilateral 12/30/2014   Procedure: LAPAROSCOPIC BILATERAL SALPINGECTOMY;  Surgeon: Lazaro Arms, MD;  Location: [facility];  Service: Gynecology;  Laterality: Bilateral;   SUPRACERVICAL ABDOMINAL HYSTERECTOMY N/A 04/21/2020   Procedure: SUPRACERVICAL ABDOMINAL HYSTERECTOMY;  Surgeon: Lazaro Arms, MD;  Location: [facility];  Service: Gynecology;  Laterality: N/A;    OB History     Gravida  4   Para  3   Term  3   Preterm  0   AB  1   Living  3      SAB  0   IAB  1   Ectopic  0   Multiple  0   Live Births  3            Home Medications    Prior to Admission medications    Medication Sig Start Date End Date Taking? Authorizing Provider  amoxicillin (AMOXIL) 875 MG tablet Take 1 tablet (875 mg total) by mouth 2 (two) times daily. 01/10/23  Yes Particia Nearing, PA-C  lidocaine (XYLOCAINE) 2 % solution Use as directed 10 mLs in the mouth or throat every 3 (three) hours as needed. 01/10/23  Yes Particia Nearing, PA-C  HYDROcodone-acetaminophen [facility]) 10-325 MG tablet Take 1 tablet by mouth every 4 (four) hours as needed. 02/10/22   [provider]  ibuprofen (ADVIL) 800 MG tablet Take 1 tablet (800 mg total) by mouth every 8 (eight) hours as needed. 04/21/20   Lazaro Arms, MD  losartan (COZAAR) 25 MG tablet Take 25 mg by mouth every morning. 12/11/21   [provider]  ondansetron (ZOFRAN-ODT) 4 MG disintegrating tablet Take 1 tablet (4 mg total) by mouth every 8 (eight) hours as needed for nausea or vomiting. 09/17/21   Cecil Cobbs, PA-C  rizatriptan (MAXALT) 10 MG tablet Take 1 tablet (10 mg total) by mouth as needed for migraine. May repeat in 2 hours if needed.  Maximum 2 tablets in 24 hours. 02/21/22   Drema Dallas, DO  topiramate (TOPAMAX) 25 MG tablet Take 1 tablet (25 mg total) by mouth  at bedtime. 02/21/22   Everlena Cooper, Adam R, DO  albuterol (PROVENTIL HFA;VENTOLIN HFA) 108 (90 Base) MCG/ACT inhaler Inhale 1-2 puffs every 6 (six) hours as needed into the lungs for wheezing or shortness of breath. 01/16/17 03/10/19  Triplett, Tammy, PA-C  dicyclomine (BENTYL) 20 MG tablet Take 1 tablet (20 mg total) by mouth 2 (two) times daily. 01/22/19 03/10/19  Emi Holes, PA-C    Family History Family History  Problem Relation Age of Onset   Hypertension Father    Diabetes Maternal Grandmother    Cancer Maternal Grandmother        pancreatic   Cancer Maternal Grandfather        lungs    Social History Social History   Tobacco Use   Smoking status: Never   Smokeless tobacco: Never  Vaping Use   Vaping status: Never Used   Substance Use Topics   Alcohol use: No   Drug use: No     Allergies   Patient has no known allergies.   Review of Systems Review of Systems Per HPI  Physical Exam Triage Vital Signs ED Triage Vitals  Encounter Vitals Group     BP 01/10/23 1835 (!) 147/109     Systolic BP Percentile --      Diastolic BP Percentile --      Pulse Rate 01/10/23 1835 81     Resp 01/10/23 1835 16     Temp 01/10/23 1835 98.6 F (37 C)     Temp Source 01/10/23 1835 Oral     SpO2 01/10/23 1835 98 %     Weight --      Height --      Head Circumference --      Peak Flow --      Pain Score 01/10/23 1836 8     Pain Loc --      Pain Education --      Exclude from Growth Chart --    No data found.  Updated Vital Signs BP (!) 147/109 (BP Location: Right Arm)   Pulse 81   Temp 98.6 F (37 C) (Oral)   Resp 16   LMP 01/17/2020   SpO2 98%   Visual Acuity Right Eye Distance:   Left Eye Distance:   Bilateral Distance:    Right Eye Near:   Left Eye Near:    Bilateral Near:     Physical Exam Vitals and nursing note reviewed.  Constitutional:      Appearance: Normal appearance. She is not ill-appearing.  HENT:     Head: Atraumatic.     Nose: Nose normal.     Mouth/Throat:     Mouth: Mucous membranes are moist.     Pharynx: Oropharyngeal exudate and posterior oropharyngeal erythema present.     Comments: Significant bilateral tonsillar erythema, edema, exudates.  Oral airway patent Eyes:     Extraocular Movements: Extraocular movements intact.     Conjunctiva/sclera: Conjunctivae normal.  Cardiovascular:     Rate and Rhythm: Normal rate and regular rhythm.     Heart sounds: Normal heart sounds.  Pulmonary:     Effort: Pulmonary effort is normal.     Breath sounds: Normal breath sounds.  Musculoskeletal:        General: Normal range of motion.     Cervical back: Normal range of motion and neck supple.  Lymphadenopathy:     Cervical: Cervical adenopathy present.  Skin:     General: Skin is warm and dry.  Neurological:  Mental Status: She is alert and oriented to person, place, and time.  Psychiatric:        Mood and Affect: Mood normal.        Thought Content: Thought content normal.        Judgment: Judgment normal.      UC Treatments / Results  Labs (all labs ordered are listed, but only abnormal results are displayed) Labs Reviewed  POCT RAPID STREP A (OFFICE)    EKG   Radiology No results found.  Procedures Procedures (including critical care time)  Medications Ordered in UC Medications - No data to display  Initial Impression / Assessment and Plan / UC Course  I have reviewed the triage vital signs and the nursing notes.  Pertinent labs & imaging results that were available during my care of the patient were reviewed by me and considered in my medical decision making (see chart for details).     Rapid strep negative but exam significantly concerning for bacterial tonsillitis.  Treat with amoxicillin, viscous lidocaine, supportive over-the-counter medications and home care.  Return for worsening symptoms.  Final Clinical Impressions(s) / UC Diagnoses   Final diagnoses:  Acute tonsillitis, unspecified etiology   Discharge Instructions   None    ED Prescriptions     Medication Sig Dispense Auth. Provider   amoxicillin (AMOXIL) 875 MG tablet Take 1 tablet (875 mg total) by mouth 2 (two) times daily. 20 tablet Particia Nearing, PA-C   lidocaine (XYLOCAINE) 2 % solution Use as directed 10 mLs in the mouth or throat every 3 (three) hours as needed. 100 mL Particia Nearing, [place]      PDMP not reviewed this encounter.   Particia Nearing, [place] 01/10/23 1954

## 2023-08-30 ENCOUNTER — Encounter (HOSPITAL_COMMUNITY): Payer: Self-pay

## 2023-08-30 ENCOUNTER — Emergency Department (HOSPITAL_COMMUNITY)

## 2023-08-30 ENCOUNTER — Emergency Department (HOSPITAL_COMMUNITY)
Admission: EM | Admit: 2023-08-30 | Discharge: 2023-08-30 | Disposition: A | Discharge status: Home / Self Care | Attending: Emergency Medicine | Admitting: Emergency Medicine

## 2023-08-30 ENCOUNTER — Other Ambulatory Visit: Payer: Self-pay

## 2023-08-30 DIAGNOSIS — J45909 Unspecified asthma, uncomplicated: Secondary | ICD-10-CM | POA: Insufficient documentation

## 2023-08-30 DIAGNOSIS — R072 Precordial pain: Secondary | ICD-10-CM | POA: Insufficient documentation

## 2023-08-30 DIAGNOSIS — I10 Essential (primary) hypertension: Secondary | ICD-10-CM | POA: Diagnosis not present

## 2023-08-30 DIAGNOSIS — Z79899 Other long term (current) drug therapy: Secondary | ICD-10-CM | POA: Insufficient documentation

## 2023-08-30 DIAGNOSIS — R079 Chest pain, unspecified: Secondary | ICD-10-CM

## 2023-08-30 LAB — CBC
HCT: 37.2 % (ref 36.0–46.0)
Hemoglobin: 12.1 g/dL (ref 12.0–15.0)
MCH: 26.8 pg (ref 26.0–34.0)
MCHC: 32.5 g/dL (ref 30.0–36.0)
MCV: 82.3 fL (ref 80.0–100.0)
Platelets: 337 10*3/uL (ref 150–400)
RBC: 4.52 MIL/uL (ref 3.87–5.11)
RDW: 15 % (ref 11.5–15.5)
WBC: 4.1 10*3/uL (ref 4.0–10.5)
nRBC: 0 % (ref 0.0–0.2)

## 2023-08-30 LAB — BASIC METABOLIC PANEL WITH GFR
Anion gap: 10 (ref 5–15)
BUN: 10 mg/dL (ref 6–20)
CO2: 23 mmol/L (ref 22–32)
Calcium: 8.5 mg/dL — ABNORMAL LOW (ref 8.9–10.3)
Chloride: 104 mmol/L (ref 98–111)
Creatinine, Ser: 0.9 mg/dL (ref 0.44–1.00)
GFR, Estimated: >60 mL/min (ref >60)
Glucose, Bld: 125 mg/dL — ABNORMAL HIGH (ref 70–99)
Potassium: 3.3 mmol/L — ABNORMAL LOW (ref 3.5–5.1)
Sodium: 137 mmol/L (ref 135–145)

## 2023-08-30 LAB — TROPONIN I (HIGH SENSITIVITY): Troponin I (High Sensitivity): 2 ng/L (ref <18)

## 2023-08-30 MED ORDER — OMEPRAZOLE 20 MG PO CPDR: 20.0000 mg | DELAYED_RELEASE_CAPSULE | Freq: Every day | ORAL | 3 refills | Status: AC

## 2023-08-30 MED ORDER — POTASSIUM CHLORIDE CRYS ER 20 MEQ PO TBCR
20.0000 meq | EXTENDED_RELEASE_TABLET | Freq: Once | ORAL | Status: AC
  Administered 2023-08-30: 20 meq via ORAL
  Filled 2023-08-30: qty 1

## 2023-08-30 NOTE — ED Triage Notes (Signed)
 Pt c/o chest tightness when trying to sleep, onset 2 days ago; pt describes feeling as weird, states it doesn't hurts it just feels weird. c/o inability to sleep for two days. Hx of similar symptoms, pt states it lasted for a week last time.

## 2023-08-30 NOTE — ED Provider Notes (Signed)
 [name] [facility] Provider Note   CSN: [id number] Arrival date & time: 08/30/23  9242     Patient presents with: Chest Pain   Christina Randall is a 38 y.o. female.  She has a history of asthma hypertension.  Complaining of 2 days of tightness in her chest and poor sleeping.  Having a lot of reflux.  Has had the symptoms before.  No prior history of cardiac disease.  Non-smoker no cocaine.  Has tried nothing for his symptoms.  No PCP currently   The history is provided by the patient.  Chest Pain Pain location:  Substernal area Pain quality: tightness   Pain radiates to:  Does not radiate Pain severity:  Moderate Onset quality:  Gradual Duration:  2 hours Timing:  Intermittent Progression:  Unchanged Chronicity:  Recurrent Relieved by:  None tried Worsened by:  Nothing Ineffective treatments:  None tried Associated symptoms: fatigue, heartburn and shortness of breath   Associated symptoms: no abdominal pain, no cough, no diaphoresis, no fever, no nausea and no vomiting   Risk factors: hypertension   Risk factors: no prior DVT/PE and no smoking        Prior to Admission medications   Medication Sig Start Date End Date Taking? Authorizing Provider  amoxicillin  (AMOXIL ) 875 MG tablet Take 1 tablet (875 mg total) by mouth 2 (two) times daily. 01/10/23   Stuart Vernell Norris, PA-C  HYDROcodone -acetaminophen  (NORCO) 10-325 MG tablet Take 1 tablet by mouth every 4 (four) hours as needed. 02/10/22   [provider]  ibuprofen  (ADVIL ) 800 MG tablet Take 1 tablet (800 mg total) by mouth every 8 (eight) hours as needed. 04/21/20   Jayne Vonn DEL, MD  lidocaine  (XYLOCAINE ) 2 % solution Use as directed 10 mLs in the mouth or throat every 3 (three) hours as needed. 01/10/23   Stuart Vernell Norris, PA-C  losartan (COZAAR) 25 MG tablet Take 25 mg by mouth every morning. 12/11/21   [provider]  ondansetron  (ZOFRAN -ODT) 4 MG  disintegrating tablet Take 1 tablet (4 mg total) by mouth every 8 (eight) hours as needed for nausea or vomiting. 09/17/21   Renae Bernarda HERO, PA-C  rizatriptan  (MAXALT ) 10 MG tablet Take 1 tablet (10 mg total) by mouth as needed for migraine. May repeat in 2 hours if needed.  Maximum 2 tablets in 24 hours. 02/21/22   Skeet Juliene SAUNDERS, DO  topiramate  (TOPAMAX ) 25 MG tablet Take 1 tablet (25 mg total) by mouth at bedtime. 02/21/22   Skeet Juliene SAUNDERS, DO  albuterol  (PROVENTIL  HFA;VENTOLIN  HFA) 108 (90 Base) MCG/ACT inhaler Inhale 1-2 puffs every 6 (six) hours as needed into the lungs for wheezing or shortness of breath. 01/16/17 03/10/19  Triplett, Tammy, PA-C  dicyclomine  (BENTYL ) 20 MG tablet Take 1 tablet (20 mg total) by mouth 2 (two) times daily. 01/22/19 03/10/19  Law, Alexandra M, PA-C    Allergies: Patient has no known allergies.    Review of Systems  Constitutional:  Positive for fatigue. Negative for diaphoresis and fever.  Respiratory:  Positive for shortness of breath. Negative for cough.   Cardiovascular:  Positive for chest pain.  Gastrointestinal:  Positive for heartburn. Negative for abdominal pain, nausea and vomiting.    Updated Vital Signs Ht 5' 7 (1.702 m)   Wt 127 kg   LMP 01/17/2020   BMI 43.85 kg/m   Physical Exam Vitals and nursing note reviewed.  Constitutional:      General: She  is not in acute distress.    Appearance: Normal appearance. She is well-developed.  HENT:     Head: Normocephalic and atraumatic.   Eyes:     Conjunctiva/sclera: Conjunctivae normal.    Cardiovascular:     Rate and Rhythm: Normal rate and regular rhythm.     Heart sounds: Normal heart sounds. No murmur heard. Pulmonary:     Effort: Pulmonary effort is normal. No respiratory distress.     Breath sounds: Normal breath sounds. No stridor. No wheezing.  Abdominal:     Palpations: Abdomen is soft.     Tenderness: There is no abdominal tenderness. There is no guarding or rebound.    Musculoskeletal:        General: No tenderness or deformity.     Cervical back: Neck supple.     Right lower leg: No tenderness. No edema.     Left lower leg: No tenderness. No edema.   Skin:    General: Skin is warm and dry.   Neurological:     General: No focal deficit present.     Mental Status: She is alert.     GCS: GCS eye subscore is 4. GCS verbal subscore is 5. GCS motor subscore is 6.     (all labs ordered are listed, but only abnormal results are displayed) Labs Reviewed  BASIC METABOLIC PANEL WITH GFR - Abnormal; Notable for the following components:      Result Value   Potassium 3.3 (*)    Glucose, Bld 125 (*)    Calcium 8.5 (*)    All other components within normal limits  CBC  TROPONIN I (HIGH SENSITIVITY)    EKG: EKG Interpretation Date/Time:  Thursday August 30 2023 08:12:08 EDT Ventricular Rate:  73 PR Interval:  152 QRS Duration:  97 QT Interval:  432 QTC Calculation: 477 R Axis:   25  Text Interpretation: Sinus rhythm No significant change since last tracing 1/22 Confirmed by Towana Sharper [phone]) on 08/30/2023 8:15:22 AM  Radiology: [facility] Chest Port 1 View Result Date: 08/30/2023 CLINICAL DATA:  Chest pain EXAM: PORTABLE CHEST 1 VIEW COMPARISON:  03/04/2012 FINDINGS: The heart size and mediastinal contours are within normal limits. Both lungs are clear. The visualized skeletal structures are unremarkable. IMPRESSION: No active disease. Electronically Signed   By: Waddell Calk M.D.   On: 08/30/2023 08:44     Procedures   Medications Ordered in the ED  potassium chloride  SA (KLOR-CON  M) CR tablet 20 mEq (20 mEq Oral Given 08/30/23 0921)    Clinical Course as of 08/30/23 1705  Thu Aug 30, 2023  0843 Chest x-ray interpreted by me as no acute findings.  Awaiting radiology reading. [MB]  [phone] Reviewed results of workup with her.  Do not feel she needs a delta troponin is very low risk and symptoms been going on for a few days.  Will put her on a  PPI.  Return instructions discussed [MB]    Clinical Course User Index [MB] Towana Sharper BROCKS, MD                                 Medical Decision Making Amount and/or Complexity of Data Reviewed Labs: ordered. Radiology: ordered.  Risk Prescription drug management.   This patient complains of chest pain heartburn; this involves an extensive number of treatment Options and is a complaint that carries with it a high risk of complications and morbidity.  The differential includes ACS, pneumonia, pneumothorax, PE, vascular, reflux, musculoskeletal  I ordered, reviewed and interpreted labs, which included CBC normal chemistries with mildly low potassium elevated glucose, troponin unremarkable I ordered medication oral potassium and reviewed PMP when indicated. I ordered imaging studies which included chest x-ray and I independently    visualized and interpreted imaging which showed no acute findings Previous records obtained and reviewed in epic no recent admissions Cardiac monitoring reviewed, normal sinus rhythm Social determinants considered, no significant barriers Critical Interventions: None  After the interventions stated above, I reevaluated the patient and found patient to be well-appearing in no distress Admission and further testing considered, no indications for admission or further workup at this time.  Will trial on PPI and recommended getting set up with a primary care doctor.  Blood pressure elevated she will also need this to be followed up.  She is in agreement with plan for admission.      Final diagnoses:  Nonspecific chest pain  Hypertension, unspecified type    ED Discharge Orders          Ordered    omeprazole (PRILOSEC) 20 MG capsule  Daily        08/30/23 0914               Cleatus Goodin C, MD 08/30/23 [phone]

## 2023-08-30 NOTE — Discharge Instructions (Addendum)
 You were seen in the emergency department for chest pain difficulty sleeping.  You also endorsed some reflux symptoms.  You had lab work EKG and chest x-ray that did not show any significant abnormalities.  No evidence of heart attack.  We are starting you on some acid medication.  He can also use Maalox and Tums.  Will be important for you to get a primary care doctor.  Return to the emergency department if any worsening or concerning symptoms

## 2023-10-10 ENCOUNTER — Ambulatory Visit: Admitting: Family Medicine

## 2024-03-13 ENCOUNTER — Other Ambulatory Visit: Payer: Self-pay | Admitting: Neurology
